# Patient Record
Sex: Female | Born: 1960 | Race: Black or African American | Hispanic: No | Marital: Married | Smoking: Never smoker
Health system: Southern US, Community
[De-identification: ages and names within clinical notes are randomized; demographics above are authoritative.]

## PROBLEM LIST (undated history)

## (undated) DIAGNOSIS — G4730 Sleep apnea, unspecified: Secondary | ICD-10-CM

## (undated) DIAGNOSIS — F329 Major depressive disorder, single episode, unspecified: Secondary | ICD-10-CM

## (undated) DIAGNOSIS — R011 Cardiac murmur, unspecified: Secondary | ICD-10-CM

## (undated) DIAGNOSIS — F32A Depression, unspecified: Secondary | ICD-10-CM

## (undated) DIAGNOSIS — F419 Anxiety disorder, unspecified: Secondary | ICD-10-CM

## (undated) DIAGNOSIS — R7303 Prediabetes: Secondary | ICD-10-CM

## (undated) DIAGNOSIS — K219 Gastro-esophageal reflux disease without esophagitis: Secondary | ICD-10-CM

## (undated) DIAGNOSIS — A4902 Methicillin resistant Staphylococcus aureus infection, unspecified site: Secondary | ICD-10-CM

## (undated) DIAGNOSIS — I10 Essential (primary) hypertension: Secondary | ICD-10-CM

## (undated) HISTORY — DX: Essential (primary) hypertension: I10

---

## 2000-08-10 HISTORY — PX: ABDOMINAL HYSTERECTOMY: SHX81

## 2005-07-10 DIAGNOSIS — I10 Essential (primary) hypertension: Secondary | ICD-10-CM | POA: Insufficient documentation

## 2005-07-31 ENCOUNTER — Ambulatory Visit: Payer: Self-pay

## 2005-08-10 DIAGNOSIS — A4902 Methicillin resistant Staphylococcus aureus infection, unspecified site: Secondary | ICD-10-CM

## 2005-08-10 HISTORY — DX: Methicillin resistant Staphylococcus aureus infection, unspecified site: A49.02

## 2006-07-13 DIAGNOSIS — Z1611 Resistance to penicillins: Secondary | ICD-10-CM

## 2006-07-13 HISTORY — DX: Resistance to penicillins: Z16.11

## 2006-09-29 DIAGNOSIS — E7800 Pure hypercholesterolemia, unspecified: Secondary | ICD-10-CM | POA: Insufficient documentation

## 2006-10-05 DIAGNOSIS — R6889 Other general symptoms and signs: Secondary | ICD-10-CM | POA: Insufficient documentation

## 2006-10-06 LAB — HM PAP SMEAR

## 2007-12-27 ENCOUNTER — Ambulatory Visit: Payer: Self-pay | Admitting: Family Medicine

## 2008-01-11 ENCOUNTER — Ambulatory Visit: Payer: Self-pay | Admitting: Family Medicine

## 2008-04-27 DIAGNOSIS — K2100 Gastro-esophageal reflux disease with esophagitis, without bleeding: Secondary | ICD-10-CM | POA: Insufficient documentation

## 2008-08-30 ENCOUNTER — Ambulatory Visit: Payer: Self-pay | Admitting: Internal Medicine

## 2009-08-23 ENCOUNTER — Ambulatory Visit: Payer: Self-pay | Admitting: Family Medicine

## 2009-08-30 ENCOUNTER — Ambulatory Visit: Payer: Self-pay

## 2009-09-19 ENCOUNTER — Ambulatory Visit: Payer: Self-pay | Admitting: Family Medicine

## 2009-09-19 LAB — HM MAMMOGRAPHY

## 2009-11-21 ENCOUNTER — Ambulatory Visit: Payer: Self-pay | Admitting: Unknown Physician Specialty

## 2009-11-21 LAB — HM COLONOSCOPY

## 2010-08-10 HISTORY — PX: KNEE SURGERY: SHX244

## 2010-08-10 HISTORY — PX: LAPAROSCOPIC OOPHORECTOMY: SUR783

## 2010-08-21 ENCOUNTER — Other Ambulatory Visit: Payer: Self-pay | Admitting: Family Medicine

## 2010-11-03 ENCOUNTER — Ambulatory Visit: Payer: Self-pay | Admitting: Specialist

## 2010-11-10 ENCOUNTER — Ambulatory Visit: Payer: Self-pay | Admitting: Specialist

## 2011-01-14 ENCOUNTER — Ambulatory Visit: Payer: Self-pay | Admitting: Family Medicine

## 2011-03-23 ENCOUNTER — Ambulatory Visit: Payer: Self-pay | Admitting: Family Medicine

## 2011-03-27 ENCOUNTER — Ambulatory Visit: Payer: Self-pay | Admitting: Family Medicine

## 2011-04-01 ENCOUNTER — Ambulatory Visit: Payer: Self-pay | Admitting: Obstetrics & Gynecology

## 2011-04-07 ENCOUNTER — Ambulatory Visit: Payer: Self-pay | Admitting: Obstetrics & Gynecology

## 2011-04-10 LAB — PATHOLOGY REPORT

## 2011-08-11 HISTORY — PX: APPENDECTOMY: SHX54

## 2012-01-20 ENCOUNTER — Other Ambulatory Visit: Payer: Self-pay | Admitting: Family Medicine

## 2012-01-20 LAB — COMPREHENSIVE METABOLIC PANEL
Alkaline Phosphatase: 112 U/L (ref 50–136)
Anion Gap: 9 (ref 7–16)
Bilirubin,Total: 0.3 mg/dL (ref 0.2–1.0)
Co2: 29 mmol/L (ref 21–32)
Creatinine: 1.21 mg/dL (ref 0.60–1.30)
EGFR (African American): 60 — ABNORMAL LOW
EGFR (Non-African Amer.): 52 — ABNORMAL LOW
Glucose: 101 mg/dL — ABNORMAL HIGH (ref 65–99)
Osmolality: 288 (ref 275–301)
SGPT (ALT): 39 U/L
Sodium: 143 mmol/L (ref 136–145)

## 2012-01-20 LAB — CBC WITH DIFFERENTIAL/PLATELET
Eosinophil #: 0.2 10*3/uL (ref 0.0–0.7)
Lymphocyte #: 3.3 10*3/uL (ref 1.0–3.6)
Lymphocyte %: 47 %
MCV: 80 fL (ref 80–100)
Monocyte #: 0.6 x10 3/mm (ref 0.2–0.9)
Monocyte %: 8.8 %
Neutrophil #: 2.8 10*3/uL (ref 1.4–6.5)
Platelet: 206 10*3/uL (ref 150–440)

## 2012-01-20 LAB — LIPID PANEL
Cholesterol: 177 mg/dL (ref 0–200)
Ldl Cholesterol, Calc: 115 mg/dL — ABNORMAL HIGH (ref 0–100)

## 2012-01-20 LAB — HEMOGLOBIN A1C: Hemoglobin A1C: 6.7 % — ABNORMAL HIGH (ref 4.2–6.3)

## 2012-02-05 ENCOUNTER — Ambulatory Visit: Payer: Self-pay | Admitting: General Surgery

## 2012-02-09 ENCOUNTER — Ambulatory Visit: Payer: Self-pay | Admitting: Anesthesiology

## 2012-02-10 ENCOUNTER — Ambulatory Visit: Payer: Self-pay | Admitting: General Surgery

## 2012-02-15 LAB — PATHOLOGY REPORT

## 2012-03-09 ENCOUNTER — Ambulatory Visit: Payer: Self-pay | Admitting: Family Medicine

## 2012-03-10 ENCOUNTER — Ambulatory Visit: Payer: Self-pay | Admitting: Family Medicine

## 2012-03-24 ENCOUNTER — Ambulatory Visit: Payer: Self-pay | Admitting: Family Medicine

## 2012-04-18 ENCOUNTER — Ambulatory Visit: Payer: Self-pay | Admitting: Family Medicine

## 2012-05-10 ENCOUNTER — Ambulatory Visit: Payer: Self-pay | Admitting: Family Medicine

## 2012-06-10 ENCOUNTER — Ambulatory Visit: Payer: Self-pay | Admitting: Family Medicine

## 2012-10-07 ENCOUNTER — Emergency Department: Payer: Self-pay | Admitting: Emergency Medicine

## 2012-10-09 ENCOUNTER — Emergency Department: Payer: Self-pay | Admitting: Unknown Physician Specialty

## 2012-10-20 ENCOUNTER — Encounter: Payer: Self-pay | Admitting: General Practice

## 2012-11-08 ENCOUNTER — Encounter: Payer: Self-pay | Admitting: General Practice

## 2012-11-30 ENCOUNTER — Ambulatory Visit: Payer: Self-pay | Admitting: Family Medicine

## 2012-12-08 ENCOUNTER — Encounter: Payer: Self-pay | Admitting: General Practice

## 2013-05-23 ENCOUNTER — Ambulatory Visit: Payer: Self-pay | Admitting: Family Medicine

## 2013-07-18 IMAGING — CT CT ABD-PELV W/ CM
1 of 2 series · 16 of 32 positions shown, 20 images · IV contrast (isovue)
Comparison: none

REASON FOR EXAM: abdominal pain
COMMENTS:

PROCEDURE:     CT  - CT ABDOMEN / PELVIS  W  - February 05, 2012  [DATE]
RESULT:     Comparison:  03/23/2011
TECHNIQUE: Multiple axial images of the abdomen and pelvis were performed
from the lung bases to the pubic symphysis, with p.o. contrast and with 100
mL of Isovue 300 intravenous contrast.

[Series 2: 3mm soft tissue · axial · 0.64mm/px · z∈[-521,-89]mm · 16 of 158 slices shown, 20 images]
[im 7/158  soft-tissue]
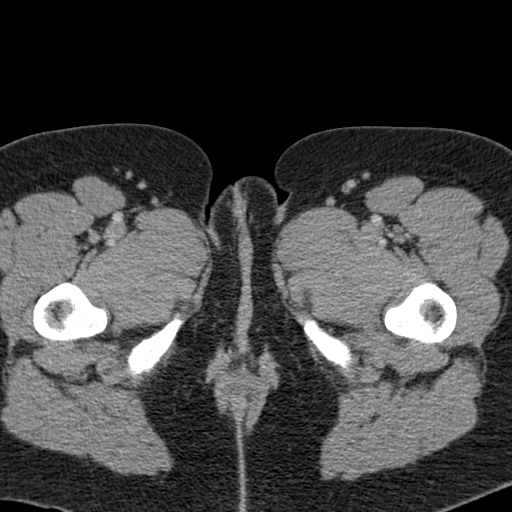
[im 7/158  bone]
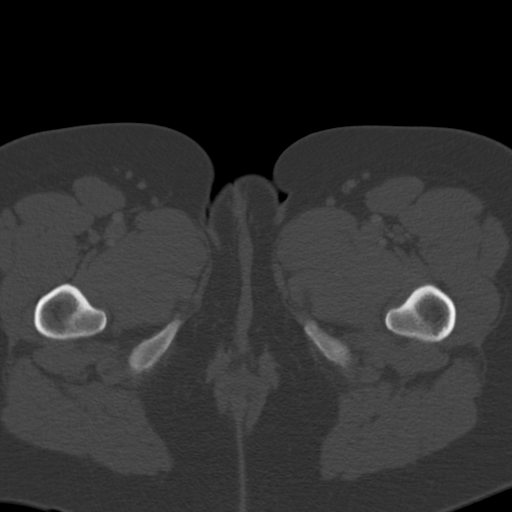
[im 19/158  soft-tissue]
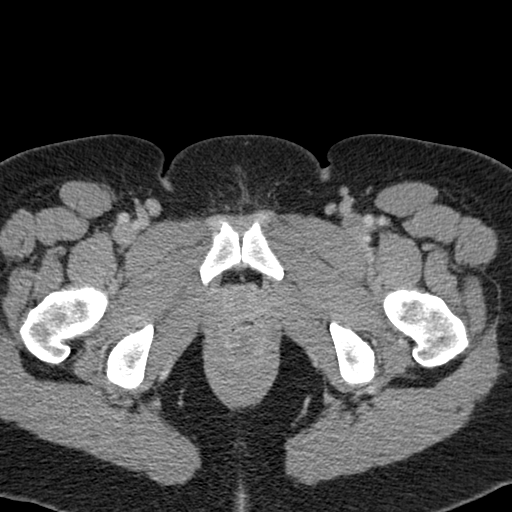
[im 32/158  soft-tissue]
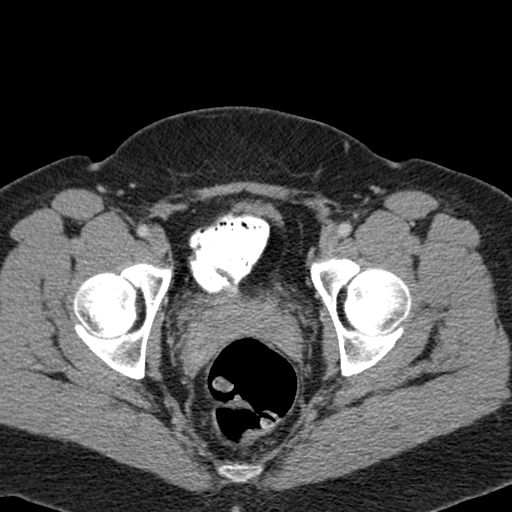
[im 44/158  soft-tissue]
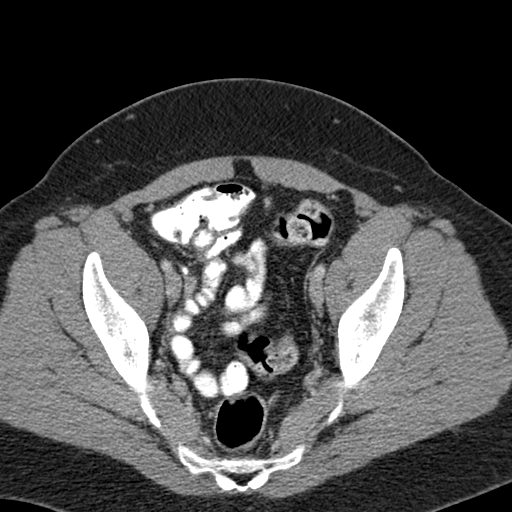
[im 51/158  soft-tissue]
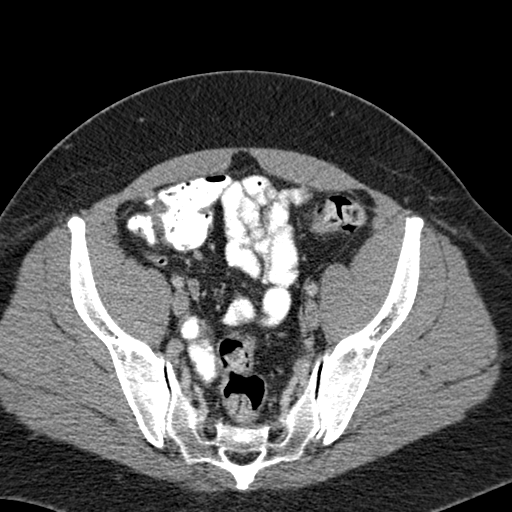
[im 63/158  soft-tissue]
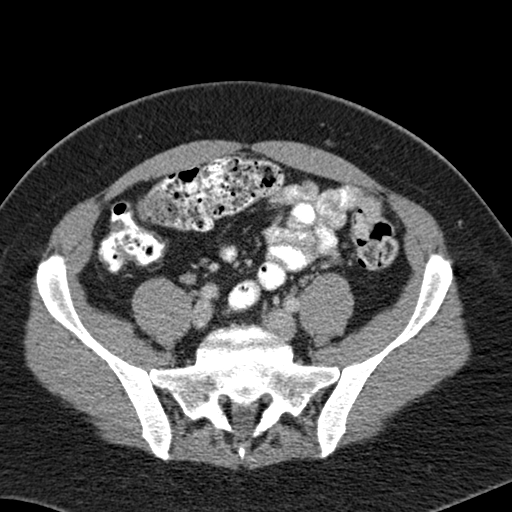
[im 76/158  soft-tissue]
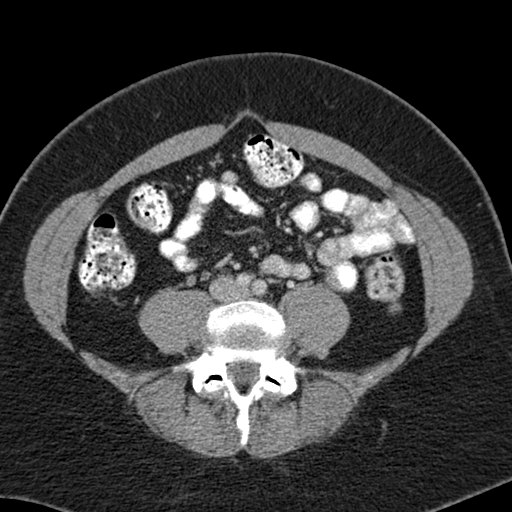
[im 82/158  soft-tissue]
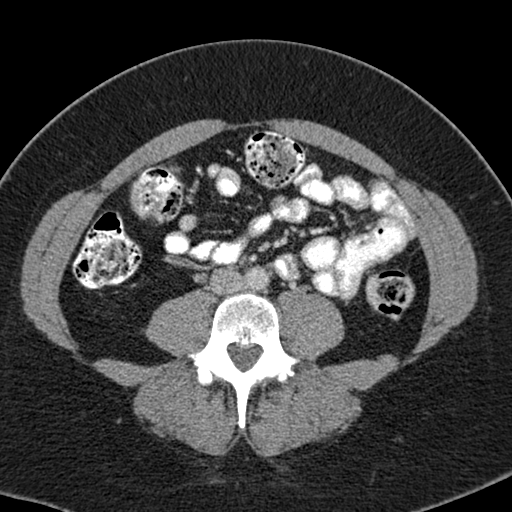
[im 95/158  soft-tissue]
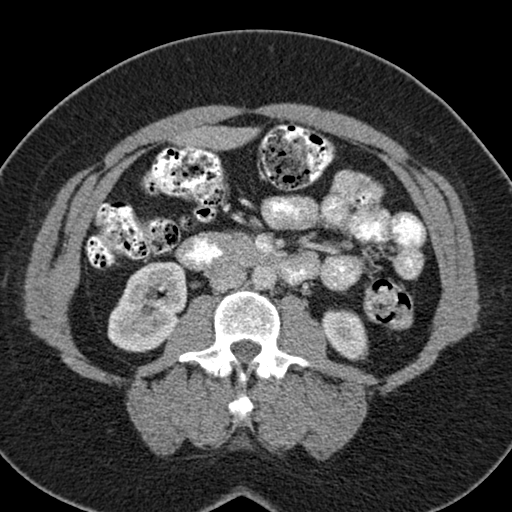
[im 95/158  bone]
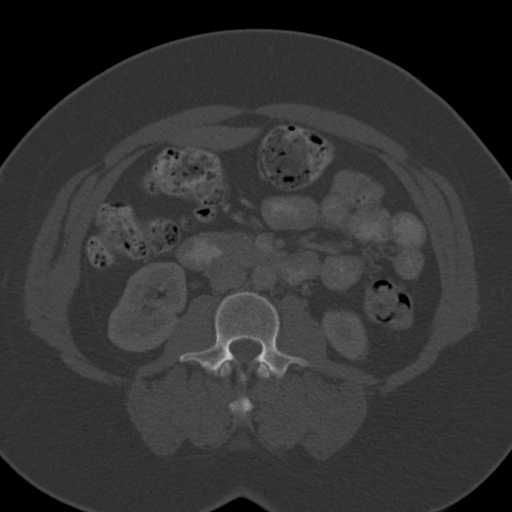
[im 107/158  soft-tissue]
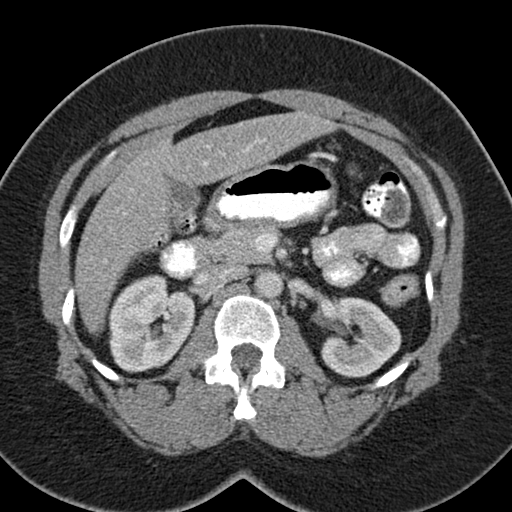
[im 120/158  soft-tissue]
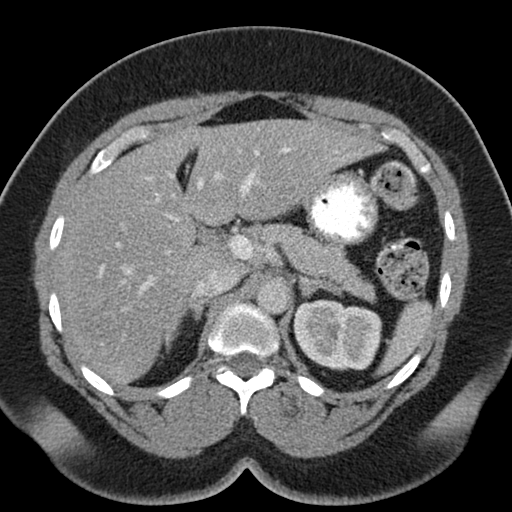
[im 126/158  soft-tissue]
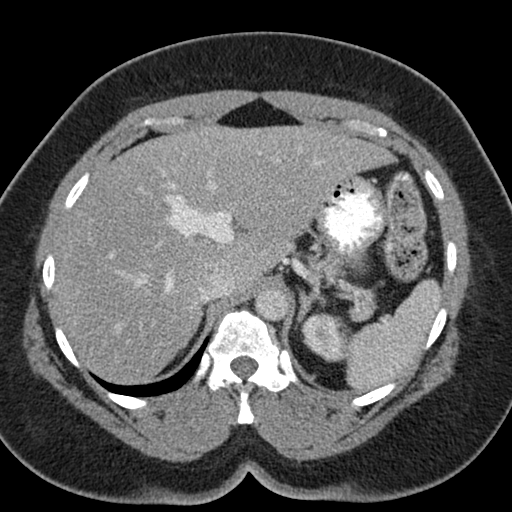
[im 132/158  lung]
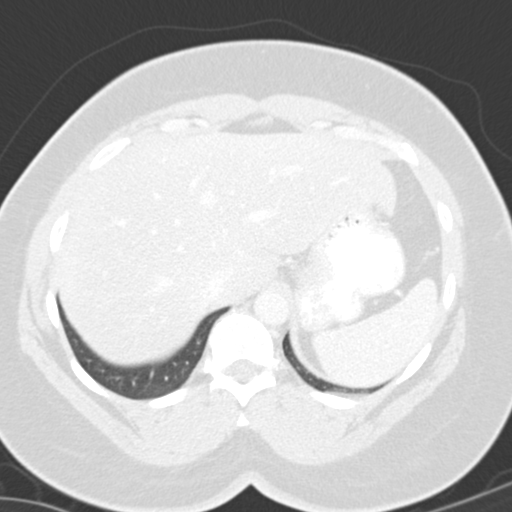
[im 139/158  soft-tissue]
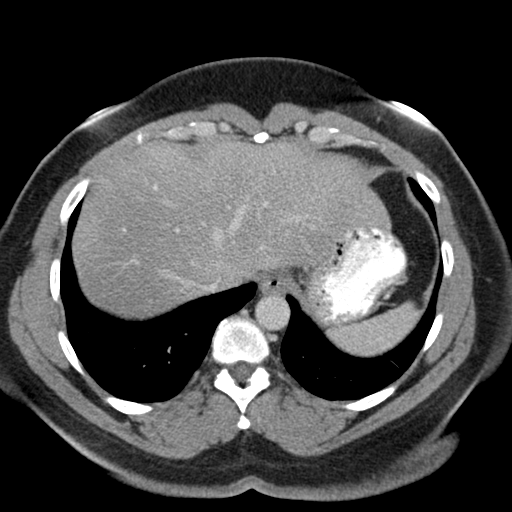
[im 139/158  lung]
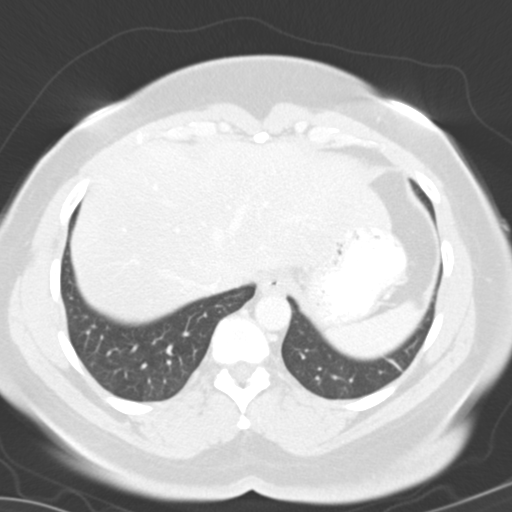
[im 145/158  lung]
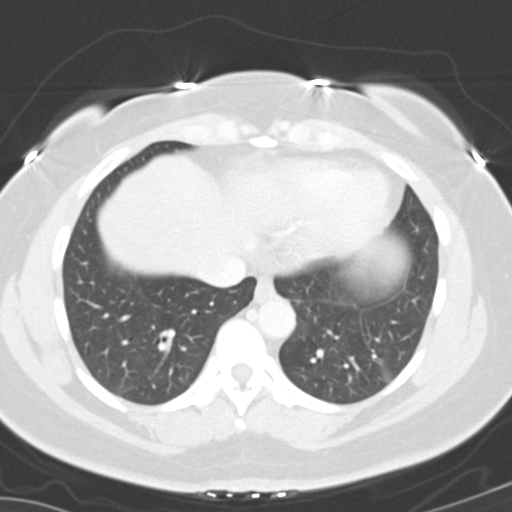
[im 151/158  soft-tissue]
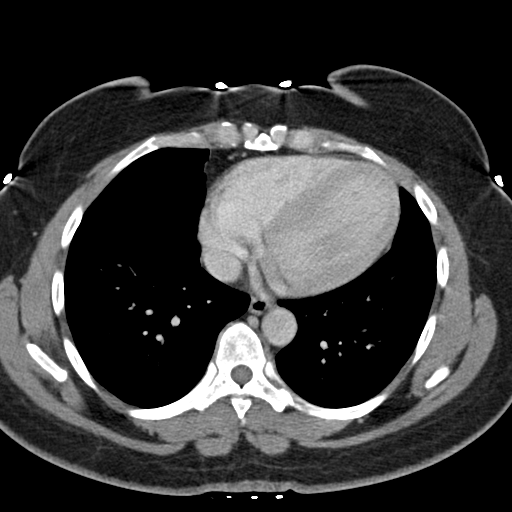
[im 151/158  lung]
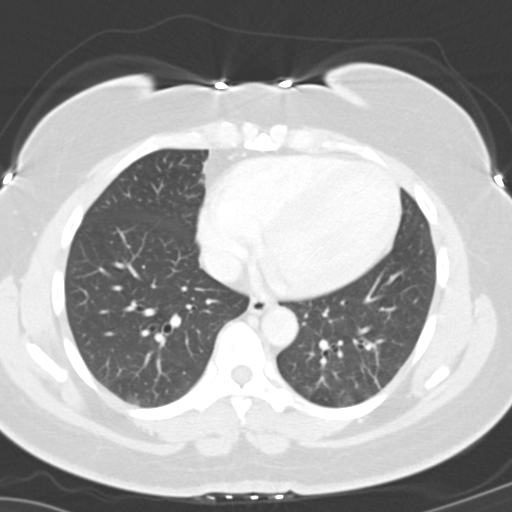

[16 of 32 positions shown; findings below may reference images not displayed]

FINDINGS: The liver is slightly low in attenuation, raising the possibility of hepatic
steatosis. The gallbladder, spleen, adrenals, and pancreas are unremarkable.
The kidneys enhance normally.

The patient is status post hysterectomy. The small and large bowel are
normal in caliber. The appendix is normal.

No aggressive lytic or sclerotic osseous lesions are identified.
IMPRESSION: No acute findings in the abdomen or pelvis.

## 2013-08-10 HISTORY — PX: COLONOSCOPY: SHX174

## 2014-06-28 ENCOUNTER — Ambulatory Visit: Payer: Self-pay | Admitting: Family Medicine

## 2014-06-28 LAB — COMPREHENSIVE METABOLIC PANEL
ANION GAP: 8 (ref 7–16)
Albumin: 3.8 g/dL (ref 3.4–5.0)
Alkaline Phosphatase: 89 U/L
BILIRUBIN TOTAL: 0.4 mg/dL (ref 0.2–1.0)
BUN: 13 mg/dL (ref 7–18)
CALCIUM: 9.2 mg/dL (ref 8.5–10.1)
CO2: 27 mmol/L (ref 21–32)
Chloride: 105 mmol/L (ref 98–107)
Creatinine: 1.22 mg/dL (ref 0.60–1.30)
EGFR (African American): 59 — ABNORMAL LOW
EGFR (Non-African Amer.): 49 — ABNORMAL LOW
GLUCOSE: 124 mg/dL — AB (ref 65–99)
Osmolality: 281 (ref 275–301)
Potassium: 3.4 mmol/L — ABNORMAL LOW (ref 3.5–5.1)
SGOT(AST): 26 U/L (ref 15–37)
SGPT (ALT): 40 U/L
SODIUM: 140 mmol/L (ref 136–145)
Total Protein: 7.8 g/dL (ref 6.4–8.2)

## 2014-06-28 LAB — LIPID PANEL
Cholesterol: 173 mg/dL (ref 0–200)
HDL Cholesterol: 25 mg/dL — ABNORMAL LOW (ref 40–60)
Ldl Cholesterol, Calc: 126 mg/dL — ABNORMAL HIGH (ref 0–100)
Triglycerides: 111 mg/dL (ref 0–200)
VLDL Cholesterol, Calc: 22 mg/dL (ref 5–40)

## 2014-06-28 LAB — HEMOGLOBIN A1C: Hemoglobin A1C: 6.8 % — ABNORMAL HIGH (ref 4.2–6.3)

## 2014-07-18 ENCOUNTER — Ambulatory Visit: Payer: Self-pay | Admitting: Family Medicine

## 2014-10-29 LAB — CBC AND DIFFERENTIAL
HEMATOCRIT: 42 % (ref 36–46)
HEMOGLOBIN: 13.2 g/dL (ref 12.0–16.0)
Neutrophils Absolute: 32 /uL
PLATELETS: 255 10*3/uL (ref 150–399)
WBC: 6.5 10*3/mL

## 2014-11-16 ENCOUNTER — Encounter: Payer: Self-pay | Admitting: General Surgery

## 2014-11-28 ENCOUNTER — Encounter: Payer: Self-pay | Admitting: General Surgery

## 2014-11-29 ENCOUNTER — Ambulatory Visit (INDEPENDENT_AMBULATORY_CARE_PROVIDER_SITE_OTHER): Payer: BC Managed Care – PPO | Admitting: General Surgery

## 2014-11-29 ENCOUNTER — Encounter: Payer: Self-pay | Admitting: General Surgery

## 2014-11-29 VITALS — BP 160/82 | HR 88 | Resp 16 | Ht 69.0 in | Wt 260.0 lb

## 2014-11-29 DIAGNOSIS — Q828 Other specified congenital malformations of skin: Secondary | ICD-10-CM | POA: Diagnosis not present

## 2014-11-29 NOTE — Progress Notes (Signed)
Patient ID: Miranda Moore, female   DOB: 23-Dec-1960, 54 y.o.   MRN: [id number]  Chief Complaint  Patient presents with  . Procedure    mole removal    HPI Miranda Moore is a 54 y.o. female.  Here today for given tag excision. She has noticed them readily developing over several years. They've become more symptomatic as they catch her jewelry and have been gradually enlarging.   HPI  Past Medical History  Diagnosis Date  . Hypertension     Past Surgical History  Procedure Laterality Date  . Abdominal hysterectomy  2002  . Laparoscopic oophorectomy  2012  . Knee surgery  2012  . Colonoscopy  2015    Family History  Problem Relation Age of Onset  . Cancer Mother     colon    Social History History  Substance Use Topics  . Smoking status: Never Smoker   . Smokeless tobacco: Never Used  . Alcohol Use: 0.0 oz/week    0 Standard drinks or equivalent per week     Comment: 2-4/week    Allergies  Allergen Reactions  . Other Hives    actifed  . Sulfur Other (See Comments)    Low b/p    Current Outpatient Prescriptions  Medication Sig Dispense Refill  . ALPRAZolam (XANAX) 0.5 MG tablet Take 0.5 mg by mouth 3 (three) times daily as needed.   0  . Amlodipine-Valsartan-HCTZ 10-160-12.5 MG TABS daily.     Marland Kitchen aspirin 81 MG tablet Take 81 mg by mouth daily.    . Cholecalciferol (VITAMIN D-3) 1000 UNITS CAPS Take by mouth daily.    . furosemide (LASIX) 20 MG tablet Take 20 mg by mouth daily.     Marland Kitchen omeprazole (PRILOSEC) 40 MG capsule Take 40 mg by mouth daily.     Marland Kitchen POTASSIUM CITRATE PO Take by mouth daily.     No current facility-administered medications for this visit.    Review of Systems Review of Systems  Constitutional: Negative.   Respiratory: Negative.   Cardiovascular: Negative.     Blood pressure 160/82, pulse 88, resp. rate 16, height 5\' 9"  (1.753 m), weight 260 lb (117.935 kg).  Physical Exam Physical Exam  Constitutional: She is oriented to person,  place, and time. She appears well-developed and well-nourished.  HENT:  Head:    Neck: Neck supple.  Lymphadenopathy:    She has no cervical adenopathy.  Neurological: She is alert and oriented to person, place, and time.  Skin: Skin is warm and dry.      Assessment    Interim dramatic skin tags on the lower neck.    Plan    Thermal excision was reviewed. The area was prepped with alcohol. A total of 3 mL of 0.5% Xylocaine with 0.25% Marcaine with 1-200,000 epinephrine was utilized well tolerated. Thermal cautery was used to remove the lesions. Each site was treated with bacitracin and a Band-Aid.  The patient will apply daily bacitracin until complete healing is noted. No samples to pathology.     Follow up as needed.  PCP:  Etheleen Mayhew 11/29/2014, 7:31 PM

## 2014-11-29 NOTE — Patient Instructions (Signed)
Keep area clean 

## 2015-02-15 ENCOUNTER — Other Ambulatory Visit: Payer: Self-pay | Admitting: Family Medicine

## 2015-02-15 DIAGNOSIS — F419 Anxiety disorder, unspecified: Secondary | ICD-10-CM

## 2015-02-15 NOTE — Telephone Encounter (Signed)
Please phone in rx. Thanks!

## 2015-04-04 ENCOUNTER — Other Ambulatory Visit: Payer: Self-pay | Admitting: Family Medicine

## 2015-04-04 DIAGNOSIS — F419 Anxiety disorder, unspecified: Secondary | ICD-10-CM

## 2015-04-04 NOTE — Telephone Encounter (Signed)
Last OV 10/2014 (Saw Simona Huh)  Thanks,   -Mickel Baas

## 2015-04-04 NOTE — Telephone Encounter (Signed)
Please call in rx.  Thanks.  

## 2015-05-20 ENCOUNTER — Other Ambulatory Visit: Payer: Self-pay | Admitting: Family Medicine

## 2015-05-20 DIAGNOSIS — F419 Anxiety disorder, unspecified: Secondary | ICD-10-CM

## 2015-05-20 NOTE — Telephone Encounter (Signed)
Last OV 10/2014  Thanks,   -Aerin Delany  

## 2015-05-31 ENCOUNTER — Ambulatory Visit (INDEPENDENT_AMBULATORY_CARE_PROVIDER_SITE_OTHER): Payer: BC Managed Care – PPO | Admitting: Family Medicine

## 2015-05-31 ENCOUNTER — Encounter: Payer: Self-pay | Admitting: Family Medicine

## 2015-05-31 VITALS — BP 140/80 | HR 78 | Temp 97.9°F | Resp 16 | Wt 265.6 lb

## 2015-05-31 DIAGNOSIS — I10 Essential (primary) hypertension: Secondary | ICD-10-CM

## 2015-05-31 DIAGNOSIS — E669 Obesity, unspecified: Secondary | ICD-10-CM | POA: Insufficient documentation

## 2015-05-31 DIAGNOSIS — K090 Developmental odontogenic cysts: Secondary | ICD-10-CM | POA: Insufficient documentation

## 2015-05-31 DIAGNOSIS — F419 Anxiety disorder, unspecified: Secondary | ICD-10-CM | POA: Diagnosis not present

## 2015-05-31 DIAGNOSIS — R05 Cough: Secondary | ICD-10-CM | POA: Insufficient documentation

## 2015-05-31 DIAGNOSIS — R0789 Other chest pain: Secondary | ICD-10-CM | POA: Insufficient documentation

## 2015-05-31 DIAGNOSIS — K2100 Gastro-esophageal reflux disease with esophagitis, without bleeding: Secondary | ICD-10-CM

## 2015-05-31 DIAGNOSIS — M546 Pain in thoracic spine: Secondary | ICD-10-CM | POA: Insufficient documentation

## 2015-05-31 DIAGNOSIS — E559 Vitamin D deficiency, unspecified: Secondary | ICD-10-CM | POA: Insufficient documentation

## 2015-05-31 DIAGNOSIS — R011 Cardiac murmur, unspecified: Secondary | ICD-10-CM | POA: Insufficient documentation

## 2015-05-31 DIAGNOSIS — R059 Cough, unspecified: Secondary | ICD-10-CM | POA: Insufficient documentation

## 2015-05-31 DIAGNOSIS — M545 Low back pain, unspecified: Secondary | ICD-10-CM | POA: Insufficient documentation

## 2015-05-31 DIAGNOSIS — R21 Rash and other nonspecific skin eruption: Secondary | ICD-10-CM | POA: Insufficient documentation

## 2015-05-31 DIAGNOSIS — E042 Nontoxic multinodular goiter: Secondary | ICD-10-CM | POA: Insufficient documentation

## 2015-05-31 DIAGNOSIS — N951 Menopausal and female climacteric states: Secondary | ICD-10-CM | POA: Insufficient documentation

## 2015-05-31 DIAGNOSIS — R7309 Other abnormal glucose: Secondary | ICD-10-CM | POA: Insufficient documentation

## 2015-05-31 DIAGNOSIS — O039 Complete or unspecified spontaneous abortion without complication: Secondary | ICD-10-CM | POA: Insufficient documentation

## 2015-05-31 DIAGNOSIS — R609 Edema, unspecified: Secondary | ICD-10-CM | POA: Insufficient documentation

## 2015-05-31 DIAGNOSIS — E010 Iodine-deficiency related diffuse (endemic) goiter: Secondary | ICD-10-CM | POA: Insufficient documentation

## 2015-05-31 DIAGNOSIS — E119 Type 2 diabetes mellitus without complications: Secondary | ICD-10-CM | POA: Insufficient documentation

## 2015-05-31 DIAGNOSIS — R600 Localized edema: Secondary | ICD-10-CM | POA: Insufficient documentation

## 2015-05-31 HISTORY — DX: Complete or unspecified spontaneous abortion without complication: O03.9

## 2015-05-31 MED ORDER — ALPRAZOLAM 0.5 MG PO TABS: 0.5000 mg | ORAL_TABLET | Freq: Every evening | ORAL | Status: DC | PRN

## 2015-05-31 NOTE — Progress Notes (Signed)
Patient ID: Miranda Moore, female   DOB: [birth year], 54 y.o.   MRN: [id number]       Patient: Miranda Moore Female    DOB: 25-May-1961   54 y.o.   MRN: [id number] Visit Date: 05/31/2015  Today's Provider: Vernie Murders, PA   Chief Complaint  Patient presents with  . Anxiety    follow-up, pt takes alprazolam 0.5 mg prn   Subjective:    Anxiety Presents for follow-up visit. Onset was 1 to 5 years ago. The problem has been gradually worsening. Symptoms include irritability and muscle tension. Patient reports no chest pain, decreased concentration, dizziness, dry mouth, excessive worry, hyperventilation, insomnia, nervous/anxious behavior, panic, restlessness, shortness of breath or suicidal ideas. Symptoms occur occasionally. The severity of symptoms is moderate. The symptoms are aggravated by work stress. The patient sleeps 6 hours per night. The quality of sleep is fair. Nighttime awakenings: one to two.   Treatments tried: xanax 0.5 mg prn. The treatment provided mild relief. Compliance with prior treatments has been good. Compliance with medications is 76-100%.   Patient Active Problem List   Diagnosis Date Noted  . Abortion, spontaneous 05/31/2015  . Edema leg 05/31/2015  . Chest discomfort 05/31/2015  . Cough 05/31/2015  . Diabetes mellitus ([place]) 05/31/2015  . Accumulation of fluid in tissues 05/31/2015  . Big thyroid 05/31/2015  . Cardiac murmur 05/31/2015  . Hot flash, menopausal 05/31/2015  . LBP (low back pain) 05/31/2015  . Acute thoracic back pain 05/31/2015  . Multinodular goiter 05/31/2015  . Adiposity 05/31/2015  . Abnormal blood sugar 05/31/2015  . Cutaneous eruption 05/31/2015  . Avitaminosis D 05/31/2015  . Anxiety 02/15/2015  . Accessory skin tags 11/29/2014  . Esophagitis, reflux 04/27/2008  . Other general symptoms and signs 10/05/2006  . Pure hypercholesterolemia 09/29/2006  . 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 Acute  stress disorder 02/18/2006  . 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 Bone/cartilage disorder 07/29/2005  . Benign essential HTN 07/10/2005   Past Surgical History  Procedure Laterality Date  . Abdominal hysterectomy  2002  . Laparoscopic oophorectomy  2012  . Knee surgery  2012  . Colonoscopy  2015   Family History  Problem Relation Age of Onset  . Cancer Mother     colon    Allergies  Allergen Reactions  . Chlorpheniramine-Phenylephrine Hives  . Other Hives    actifed  . Sulfa Antibiotics   . Sulfur Other (See Comments)    Low b/p   Previous Medications   ALPRAZOLAM (XANAX) 0.5 MG TABLET    TAKE 1/2 TO 1 TABLET BY MOUTH 3 TIMES A DAY AS NEEDED   AMLODIPINE-VALSARTAN-HCTZ 10-160-12.5 MG TABS    daily.    ASPIRIN 81 MG TABLET    Take 81 mg by mouth daily.   CHOLECALCIFEROL (VITAMIN D-3) 1000 UNITS CAPS    Take by mouth daily.   FUROSEMIDE (LASIX) 20 MG TABLET    Take 20 mg by mouth daily.    OMEPRAZOLE (PRILOSEC) 40 MG CAPSULE    Take 40 mg by mouth daily.    POTASSIUM CITRATE PO    Take by mouth daily.    Review of Systems  Constitutional: Positive for irritability.  Respiratory: Negative for shortness of breath.   Cardiovascular: Negative for chest pain.  Neurological: Negative for dizziness.  Psychiatric/Behavioral: Negative for suicidal ideas and decreased concentration. The patient is not nervous/anxious and does not have insomnia.     Social History  Substance  Use Topics  . Smoking status: Never Smoker   . Smokeless tobacco: Never Used  . Alcohol Use: 0.0 oz/week    0 Standard drinks or equivalent per week     Comment: 2-4/week   Objective:   BP 140/80 mmHg  Pulse 78  Temp(Src) 97.9 F (36.6 C) (Oral)  Resp 16  Wt 265 lb 9.6 oz (120.475 kg)  SpO2 97%  Physical Exam  Constitutional: She is oriented to person, place, and time. She appears well-developed and well-nourished.  HENT:  Head: Normocephalic and atraumatic.  Right Ear: External ear  normal.  Left Ear: External ear normal.  Nose: Nose normal.  Mouth/Throat: Oropharynx is clear and moist.  Eyes: Conjunctivae and EOM are normal.  Neck: Normal range of motion. Neck supple. No thyromegaly present.  Cardiovascular: Normal rate, regular rhythm and normal heart sounds.   Pulmonary/Chest: Effort normal and breath sounds normal.  Abdominal: Soft. Bowel sounds are normal.  Musculoskeletal: Normal range of motion.  Lymphadenopathy:    She has no cervical adenopathy.  Neurological: She is alert and oriented to person, place, and time.  Skin: No rash noted.  Psychiatric: Her speech is normal. Thought content normal. Her mood appears anxious. She is agitated. Cognition and memory are normal.  Intermittent anxiousness and irritability - especially at work.       Assessment & Plan:     1. Anxiety Stable and well controlled with good sleep pattern on the Xanax one at bedtime. No panic attacks. Stress at work seems to be the most anxiety provoking activities. Will refill medication. May need counseling if issues persist. Recheck in 2-3 months. - ALPRAZolam (XANAX) 0.5 MG tablet; Take 1 tablet (0.5 mg total) by mouth at bedtime as needed for anxiety.  Dispense: 30 tablet; Refill: 1  2. Benign essential HTN Stable and tolerating Exforge-HCT without side effects. Will continue present dosage and recheck labs. Follow up pending reports. (Has a history pre-diabetic Hgb A1C). - CBC with Differential/Platelet - COMPLETE METABOLIC PANEL WITH GFR - TSH - Lipid panel - Hemoglobin A1c  3. Esophagitis, reflux Well controlled with use of Omeprazole daily. Diagnosed with LA Grade B reflux esophagitis on upper endoscopy by Dr. Vira Agar on 11-21-09. Will continue present dosage and follow up with Dr. Vira Agar as planned.       Vernie Murders, PA  [facility]

## 2015-06-26 ENCOUNTER — Other Ambulatory Visit: Payer: Self-pay | Admitting: Family Medicine

## 2015-06-26 DIAGNOSIS — R600 Localized edema: Secondary | ICD-10-CM

## 2015-08-28 ENCOUNTER — Other Ambulatory Visit: Payer: Self-pay | Admitting: Family Medicine

## 2015-08-28 DIAGNOSIS — F419 Anxiety disorder, unspecified: Secondary | ICD-10-CM

## 2015-08-28 NOTE — Telephone Encounter (Signed)
Dennis patient  Last refill: 05/31/2015 with 1 refill  Last OV: 05/31/2015

## 2015-09-04 ENCOUNTER — Other Ambulatory Visit
Admission: RE | Admit: 2015-09-04 | Discharge: 2015-09-04 | Disposition: A | Discharge status: Home / Self Care | Payer: BC Managed Care – PPO | Source: Ambulatory Visit | Attending: Family Medicine | Admitting: Family Medicine

## 2015-09-04 DIAGNOSIS — I10 Essential (primary) hypertension: Secondary | ICD-10-CM | POA: Insufficient documentation

## 2015-09-04 LAB — LIPID PANEL
CHOL/HDL RATIO: 5.5 ratio
Cholesterol: 166 mg/dL (ref 0–200)
HDL: 30 mg/dL — AB (ref >40)
LDL Cholesterol: 109 mg/dL — ABNORMAL HIGH (ref 0–99)
Triglycerides: 137 mg/dL (ref <150)
VLDL: 27 mg/dL (ref 0–40)

## 2015-09-04 LAB — CBC WITH DIFFERENTIAL/PLATELET
Basophils Absolute: 0.1 10*3/uL (ref 0–0.1)
Basophils Relative: 1 %
EOS ABS: 0.4 10*3/uL (ref 0–0.7)
Eosinophils Relative: 4 %
HEMATOCRIT: 38.2 % (ref 35.0–47.0)
HEMOGLOBIN: 12.3 g/dL (ref 12.0–16.0)
LYMPHS ABS: 3.9 10*3/uL — AB (ref 1.0–3.6)
LYMPHS PCT: 48 %
MCH: 24.5 pg — AB (ref 26.0–34.0)
MCHC: 32.2 g/dL (ref 32.0–36.0)
MCV: 76 fL — ABNORMAL LOW (ref 80.0–100.0)
Monocytes Absolute: 0.4 10*3/uL (ref 0.2–0.9)
Monocytes Relative: 6 %
NEUTROS ABS: 3.2 10*3/uL (ref 1.4–6.5)
NEUTROS PCT: 41 %
Platelets: 227 10*3/uL (ref 150–440)
RBC: 5.03 MIL/uL (ref 3.80–5.20)
RDW: 15 % — ABNORMAL HIGH (ref 11.5–14.5)
WBC: 8 10*3/uL (ref 3.6–11.0)

## 2015-09-04 LAB — TSH: TSH: 1.692 u[IU]/mL (ref 0.350–4.500)

## 2015-09-04 LAB — HEMOGLOBIN A1C: HEMOGLOBIN A1C: 6.8 % — AB (ref 4.0–6.0)

## 2015-09-06 ENCOUNTER — Ambulatory Visit (INDEPENDENT_AMBULATORY_CARE_PROVIDER_SITE_OTHER): Payer: BC Managed Care – PPO | Admitting: Family Medicine

## 2015-09-06 ENCOUNTER — Encounter: Payer: Self-pay | Admitting: Family Medicine

## 2015-09-06 VITALS — BP 124/68 | HR 84 | Temp 98.2°F | Resp 16 | Ht 69.5 in | Wt 263.0 lb

## 2015-09-06 DIAGNOSIS — Z021 Encounter for pre-employment examination: Secondary | ICD-10-CM | POA: Diagnosis not present

## 2015-09-06 DIAGNOSIS — E7800 Pure hypercholesterolemia, unspecified: Secondary | ICD-10-CM | POA: Diagnosis not present

## 2015-09-06 DIAGNOSIS — I10 Essential (primary) hypertension: Secondary | ICD-10-CM | POA: Diagnosis not present

## 2015-09-06 DIAGNOSIS — E119 Type 2 diabetes mellitus without complications: Secondary | ICD-10-CM | POA: Diagnosis not present

## 2015-09-06 LAB — POCT UA - MICROALBUMIN: Microalbumin Ur, POC: 50 mg/L

## 2015-09-06 NOTE — Progress Notes (Signed)
Patient ID: Miranda Moore, female   DOB: June 16, 1961, 55 y.o.   MRN: [id number]      Visit Date: 09/06/2015  Today's Provider: Vernie Murders, PA   Chief Complaint  Patient presents with  . Annual Exam   Subjective:    Annual physical exam Miranda Moore is a 55 y.o. female who presents today for health maintenance and complete physical. She feels well.  She reports she is sleeping well. Patient needs to get form filled out for a new job she is applying for.  LAST: Colonoscopy per patient 09/19/13 normal, with Dr. Vira Agar.  Tdap 05/28/2011  Flu shot 05/2015  Mammogram 06/2015 per patient  Pap smear 01/08/11 normal, had hysterectomy.  BMD 12/27/07 normal    Review of Systems  Constitutional: Negative.   Eyes: Negative.   Respiratory: Negative.   Cardiovascular: Positive for leg swelling.  Gastrointestinal: Negative.   Endocrine: Negative.   Genitourinary: Negative.   Musculoskeletal: Negative.   Skin: Negative.   Allergic/Immunologic: Negative.   Neurological: Positive for headaches.  Hematological: Negative.   Psychiatric/Behavioral: Negative.     Social History      She  reports that she has never smoked. She has never used smokeless tobacco. She reports that she drinks alcohol. She reports that she does not use illicit drugs.       Social History   Social History  . Marital Status: Married    Spouse Name: N/A  . Number of Children: N/A  . Years of Education: N/A   Social History Main Topics  . Smoking status: Never Smoker   . Smokeless tobacco: Never Used  . Alcohol Use: 0.0 oz/week    0 Standard drinks or equivalent per week     Comment: 2-4/week  . Drug Use: No  . Sexual Activity: Not Asked   Other Topics Concern  . None   Social History Narrative    Past Medical History  Diagnosis Date  . Hypertension      Patient Active Problem List   Diagnosis Date Noted  . Abortion, spontaneous 05/31/2015  . Edema leg 05/31/2015  . Chest discomfort  05/31/2015  . Cough 05/31/2015  . Diabetes mellitus ([place]) 05/31/2015  . Accumulation of fluid in tissues 05/31/2015  . Big thyroid 05/31/2015  . Cardiac murmur 05/31/2015  . Hot flash, menopausal 05/31/2015  . LBP (low back pain) 05/31/2015  . Acute thoracic back pain 05/31/2015  . Multinodular goiter 05/31/2015  . Adiposity 05/31/2015  . Abnormal blood sugar 05/31/2015  . Cutaneous eruption 05/31/2015  . Avitaminosis D 05/31/2015  . Anxiety 02/15/2015  . Accessory skin tags 11/29/2014  . Esophagitis, reflux 04/27/2008  . Other general symptoms and signs 10/05/2006  . Pure hypercholesterolemia 09/29/2006  . 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 Acute stress disorder 02/18/2006  . 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 Bone/cartilage disorder 07/29/2005  . Benign essential HTN 07/10/2005    Past Surgical History  Procedure Laterality Date  . Abdominal hysterectomy  2002  . Laparoscopic oophorectomy  2012  . Knee surgery  2012  . Colonoscopy  2015    Family History        Family Status  Relation Status Death Age  . Mother Deceased   . Brother Alive   . Maternal Grandmother Deceased   . Maternal Grandfather Deceased   . Paternal Grandmother Deceased   . Paternal Grandfather Deceased         Her family history includes Breast cancer  in her paternal aunt; Cancer in her mother; Dementia in her paternal grandfather; Heart disease in her father; Heart failure in her mother and paternal grandmother; Hypertension in her brother and father.    Allergies  Allergen Reactions  . Chlorpheniramine-Phenylephrine Hives  . Other Hives    actifed  . Sulfa Antibiotics   . Sulfur Other (See Comments)    Low b/p    Previous Medications   ALPRAZOLAM (XANAX) 0.5 MG TABLET    TAKE 1 TABLET BY MOUTH AT BEDTIME AS NEEDED FOR ANXIETY   AMLODIPINE-VALSARTAN-HCTZ 10-160-12.5 MG TABS    daily.    ASPIRIN 81 MG TABLET    Take 81 mg by mouth daily.    CALCIUM CARBONATE-VITAMIN D 600-200 MG-UNIT TABS       CHOLECALCIFEROL (VITAMIN D-3) 1000 UNITS CAPS    Take by mouth daily.   FUROSEMIDE (LASIX) 20 MG TABLET    TAKE 1 TABLET BY MOUTH EVERY DAY - PLEASE DISCONTINUE HYDROCHLOROTHIAZIDE WHILE TAKING LASIX   IBUPROFEN (ADVIL,MOTRIN) 800 MG TABLET    Take by mouth.   OMEGA-3 FATTY ACIDS (FISH OIL DOUBLE STRENGTH) 1200 MG CAPS    Take by mouth.   OMEPRAZOLE (PRILOSEC) 40 MG CAPSULE    Take 40 mg by mouth daily.    POTASSIUM CITRATE PO    Take by mouth daily.    Patient Care Team: Margo Common, PA as PCP - General (Family Medicine) Christene Lye, MD (General Surgery)     Objective:   Vitals: BP 124/68 mmHg  Pulse 84  Temp(Src) 98.2 F (36.8 C)  Resp 16  Ht 5' 9.5" (1.765 m)  Wt 263 lb (119.296 kg)  BMI 38.29 kg/m2   Physical Exam  Constitutional: She is oriented to person, place, and time. She appears well-developed and well-nourished.  HENT:  Head: Normocephalic and atraumatic.  Right Ear: External ear normal.  Left Ear: External ear normal.  Nose: Nose normal.  Mouth/Throat: Oropharynx is clear and moist.  Eyes: Conjunctivae and EOM are normal. Pupils are equal, round, and reactive to light. Right eye exhibits no discharge.  Neck: Normal range of motion. Neck supple. No tracheal deviation present. No thyromegaly present.  Cardiovascular: Normal rate, regular rhythm, normal heart sounds and intact distal pulses.   No murmur heard. Pulmonary/Chest: Effort normal and breath sounds normal. No respiratory distress. She has no wheezes. She has no rales. She exhibits no tenderness.  Abdominal: Soft. She exhibits no distension and no mass. There is no tenderness. There is no rebound and no guarding.  Musculoskeletal: Normal range of motion. She exhibits no edema or tenderness.  Lymphadenopathy:    She has no cervical adenopathy.  Neurological: She is alert and oriented to person, place, and time. She has normal reflexes.  No cranial nerve deficit. She exhibits normal muscle tone. Coordination normal.  Normal sensation in both feet with test by nylon string.  Skin: Skin is warm and dry. No rash noted. No erythema.  Psychiatric: She has a normal mood and affect. Her behavior is normal. Judgment and thought content normal.     Depression Screen Sleeping well and anxiety well controlled with Xanax at bedtime. No suicidal ideation.    Assessment & Plan:    1. Physical exam, pre-employment Good general health. No restrictions or limitations for job. Immunizations, colonoscopy and mammograms up to date.  2. Benign essential HTN Stable and well controlled on the Exforge-HCT and occasionally will use Lasix for edema. Encouraged to limit caffeine and  sodium in diet. Continue present regimen. Recent labs on 09-04-15 shows no anemia.  3. Type 2 diabetes mellitus without complication, without long-term current use of insulin (HCC) Stable with Hgb A1C still elevated at 6.8. Treating with diet control only now. Denies polyuria, polydipsia, polyphagia and no significant change in vision. Recommend annual eye exam with ophthalmologist and podiatrist.  - POCT UA - Microalbumin  4. Pure hypercholesterolemia Slight improvement in HDL but still low at 30. Continue low fat diet and regular exercise.

## 2015-09-09 ENCOUNTER — Encounter: Payer: Self-pay | Admitting: General Surgery

## 2015-09-09 ENCOUNTER — Ambulatory Visit (INDEPENDENT_AMBULATORY_CARE_PROVIDER_SITE_OTHER): Payer: BC Managed Care – PPO | Admitting: General Surgery

## 2015-09-09 VITALS — BP 148/88 | HR 76 | Resp 14 | Ht 69.0 in | Wt 261.0 lb

## 2015-09-09 DIAGNOSIS — L02411 Cutaneous abscess of right axilla: Secondary | ICD-10-CM | POA: Diagnosis not present

## 2015-09-09 NOTE — Patient Instructions (Signed)
Call with any concerns or questions.

## 2015-09-09 NOTE — Progress Notes (Signed)
Patient ID: Miranda Moore, female   DOB: June 19, 1961, 55 y.o.   MRN: [id number]  Chief Complaint  Patient presents with  . Abscess    axilla    HPI Miranda Moore is a 55 y.o. female here today for a right axilla abscess. Patient noticed the area on Friday 09/06/15. Denies fever, chills.  The patient was seen in the outpatient surgery area on Friday, January 27. At that time a less than 1 cm area of swelling with what appeared to be a central pore was noted. Thought this would resolve with the use of hot compresses. She is not experiencing any relief and has had increasing swelling since that time. She is seen today for planned incision and drainage.  The patient denies previous episodes of dermal infection.  I personally reviewed the patient's history. HPI  Past Medical History  Diagnosis Date  . Hypertension     Past Surgical History  Procedure Laterality Date  . Abdominal hysterectomy  2002  . Laparoscopic oophorectomy  2012  . Knee surgery  2012  . Colonoscopy  2015    Family History  Problem Relation Age of Onset  . Cancer Mother     colon  . Heart failure Mother   . Heart disease Father   . Hypertension Father   . Hypertension Brother   . Breast cancer Paternal Aunt   . Heart failure Paternal Grandmother   . Dementia Paternal Grandfather     Social History Social History  Substance Use Topics  . Smoking status: Never Smoker   . Smokeless tobacco: Never Used  . Alcohol Use: 0.0 oz/week    0 Standard drinks or equivalent per week     Comment: 2-4/week    Allergies  Allergen Reactions  . Chlorpheniramine-Phenylephrine Hives  . Other Hives    actifed  . Sulfa Antibiotics   . Sulfur Other (See Comments)    Low b/p    Current Outpatient Prescriptions  Medication Sig Dispense Refill  . ALPRAZolam (XANAX) 0.5 MG tablet TAKE 1 TABLET BY MOUTH AT BEDTIME AS NEEDED FOR ANXIETY 30 tablet 1  . Amlodipine-Valsartan-HCTZ 10-160-12.5 MG TABS daily.     Marland Kitchen aspirin 81  MG tablet Take 81 mg by mouth daily.    . Calcium Carbonate-Vitamin D 600-200 MG-UNIT TABS     . Cholecalciferol (VITAMIN D-3) 1000 UNITS CAPS Take by mouth daily.    . furosemide (LASIX) 20 MG tablet TAKE 1 TABLET BY MOUTH EVERY DAY - PLEASE DISCONTINUE HYDROCHLOROTHIAZIDE WHILE TAKING LASIX 30 tablet 5  . ibuprofen (ADVIL,MOTRIN) 800 MG tablet Take by mouth.    . Omega-3 Fatty Acids (FISH OIL DOUBLE STRENGTH) 1200 MG CAPS Take by mouth.    Marland Kitchen omeprazole (PRILOSEC) 40 MG capsule Take 40 mg by mouth daily.     Marland Kitchen POTASSIUM CITRATE PO Take by mouth daily.     No current facility-administered medications for this visit.    Review of Systems Review of Systems  Constitutional: Negative.   Respiratory: Negative.   Cardiovascular: Negative.     Blood pressure 148/88, pulse 76, resp. rate 14, height 5\' 9"  (1.753 m), weight 261 lb (118.389 kg).  Physical Exam Physical Exam  Pulmonary/Chest:      Data Reviewed Hemoglobin A1c obtained earlier this month elevated at 6.8. Minimal change from previous determinations.  Assessment    Right axillary abscess of dermal source.    Plan    The area was cleansed with alcohol and 10 mL of  0.5% Xylocaine with 0.25% Marcaine with 1-200,000 epinephrine utilized well tolerated. This was supplemented with 3 mL of 1% plain Xylocaine. The area was excised through a 1 cm elliptical incision. A central core was extracted suggestive of a sebaceous cyst. Scant bleeding was noted. The cavity was rinsed with saline and a dry dressing applied. Culture obtained. We will hold on antibiotics based on the clean nature of the wound evident at this time.  The patient was instructed in regards to wound care. We'll plan for a brief follow-up in 2 days in the day surgery area at the hospital.     PCP: Vernie Murders, PA   Robert Bellow 09/09/2015, 7:19 PM

## 2015-09-13 LAB — ANAEROBIC AND AEROBIC CULTURE

## 2015-10-03 ENCOUNTER — Ambulatory Visit
Admission: RE | Admit: 2015-10-03 | Discharge: 2015-10-03 | Disposition: A | Discharge status: Home / Self Care | Payer: BC Managed Care – PPO | Source: Ambulatory Visit | Attending: Physician Assistant | Admitting: Physician Assistant

## 2015-10-03 ENCOUNTER — Encounter: Payer: Self-pay | Admitting: Physician Assistant

## 2015-10-03 ENCOUNTER — Other Ambulatory Visit: Payer: Self-pay | Admitting: Physician Assistant

## 2015-10-03 ENCOUNTER — Ambulatory Visit (INDEPENDENT_AMBULATORY_CARE_PROVIDER_SITE_OTHER): Payer: BC Managed Care – PPO | Admitting: Physician Assistant

## 2015-10-03 VITALS — BP 130/80 | HR 66 | Temp 98.2°F | Resp 16

## 2015-10-03 DIAGNOSIS — M25561 Pain in right knee: Secondary | ICD-10-CM | POA: Diagnosis not present

## 2015-10-03 DIAGNOSIS — M8588 Other specified disorders of bone density and structure, other site: Secondary | ICD-10-CM | POA: Insufficient documentation

## 2015-10-03 DIAGNOSIS — M2341 Loose body in knee, right knee: Secondary | ICD-10-CM

## 2015-10-03 DIAGNOSIS — M1711 Unilateral primary osteoarthritis, right knee: Secondary | ICD-10-CM | POA: Insufficient documentation

## 2015-10-03 MED ORDER — TRAMADOL HCL 50 MG PO TABS: 50.0000 mg | ORAL_TABLET | Freq: Four times a day (QID) | ORAL | Status: DC | PRN

## 2015-10-03 NOTE — Patient Instructions (Signed)
Patellar Dislocation and Subluxation With Phase I Rehab Injuries to the knee often include knee cap (patellar) dislocation or subluxation. The patella is a V-shaped bone that sits in a groove in the thigh bone (trochlea). A patellar dislocation occurs when the knee cap is displaced from the trochlea, and the joint surfaces are no longer touching. A subluxation is a similar injury, where the knee cap becomes displaced, but the joint surfaces are still touching. Patellar dislocations and subluxations are most common in adolescents and younger adults. SYMPTOMS   Severe pain in the front of the knee when attempting to move the knee.  A feeling of the knee giving way.  Tenderness, swelling, and bruising (contusion) of the knee.  Numbness or paralysis below the dislocation, from pinching, cutting, or pressure on the blood vessels or nerves (uncommon).  Visible deformity, especially if the dislocation of the knee cap occurs towards the outside of the knee.  Lump on the inner knee, which is the end of the inner part of the thigh bone (femur). CAUSES  Patellar dislocations are caused by a force placed on the knee cap, that is strong enough to displace the bone from its proper alignment. Common causes of injury include:  Direct hit (trauma) to the knee.  Twisting or pivoting injury to the lower limb, when the foot is planted on the ground.  Powerful muscle contraction.  Birth defect (congenital abnormality), such as shallow or malformed joint surfaces. RISK INCREASES WITH:  Contact sports (football, rugby, soccer), sports that require jumping and landing (basketball, volleyball), or sports in which cleats are worn on shoes.  People with wide pelvis, knocked knees, shallow or malformed joint surfaces.  Previous knee injury.  Poor strength and flexibility. PREVENTION  Warm up and stretch properly before activity.  Maintain physical fitness:  Strength, flexibility, and  endurance.  Cardiovascular fitness.  For jumping or contact sports, protect the knee cap with supportive devices (elastic bandages, tape, braces, knee sleeves with a hole for the patella and a built-up outer side, or straps to pull the patella inward, or knee pads).  Use cleats of proper length. PROGNOSIS  If treated properly, patellar dislocations and subluxations usually require at least 6 weeks to heal.  RELATED COMPLICATIONS   Associated fracture or joint cartilage injury.  Damage to nearby nerves or major blood vessels (rare).  Longer healing time or recurring dislocation, if activity is resumed too soon.  Excessive bleeding within the knee, due to dislocation.  Knee cap pain and giving way, often due to inadequate or incomplete rehabilitation.  Unstable or arthritic joint, following repeated injury or delayed treatment. TREATMENT Patellar dislocations and subluxations require immediate realigning of the bones (reduction). Realigning is often completed by hand. However, surgery is sometimes needed. After realignment, treatment first involves the use of ice and medicine, to reduce pain and inflammation. Elevating the injured knee above the level of the heart will also help reduce inflammation. Restraining the knee is often needed, to allow for healing, for up to 6 weeks. After restraint, it is important to perform strengthening and stretching exercises to help regain strength and a full range of motion. These exercises may be completed at home or with a therapist. MEDICATION   If pain medicine is needed, nonsteroidal anti-inflammatory medicines (aspirin and ibuprofen), or other minor pain relievers (acetaminophen), are often advised.  Do not take pain medicine for 7 days before surgery.  Prescription pain relievers may be given, if your caregiver thinks they are needed. Use only  as directed and only as much as you need. HEAT AND COLD  Cold treatment (icing) should be applied for  10 to 15 minutes every 2 to 3 hours for inflammation and pain, and immediately after activity that aggravates your symptoms. Use ice packs or an ice massage.  Heat treatment may be used before performing stretching and strengthening activities prescribed by your caregiver, physical therapist, or athletic trainer. Use a heat pack or a warm water soak. SEEK MEDICAL CARE IF:  Pain, tenderness, or swelling gets worse, despite treatment.  You experience pain, numbness, or coldness in the foot.  Blue, gray, or dark color appears in the toenails.  Any of the following signs of infection occur after surgery: fever, increased pain, swelling, redness, drainage of fluids, or bleeding in the affected area.  New, unexplained symptoms develop. (Drugs used in treatment may produce side effects.) EXERCISES PHASE I EXERCISES RANGE OF MOTION (ROM) AND STRETCHING EXERCISES - Patellar Dislocation and Subluxation Phase I  FIRST TIME DISLOCATIONS OR SUBLUXATIONS: If you have dislocated or subluxated your knee cap for the first time, your caregiver may ask you to wear a knee brace for up to 6 weeks after your injury. This brace will keep your knee completely straight so that the healing tissues are not disrupted by your knee movement. Your caregiver may allow some motion at your knee by using a hinged brace or by allowing you to take your brace off for a limited amount of time to gently bend and straighten your knee. If this is the case, closely follow your caregiver's directions, in order to allow the best recovery.   CHRONIC OR REPEATED DISLOCATIONS OR SUBLUXATIONS: If you have chronic or repeated dislocations or subluxations, your caregiver will likely not ask you to use a brace. He or she will likely have you begin gentle range of motion activities, within the range that is comfortable for you.  Once you are allowed to start moving your knee, these are some of the initial exercises that may be included in your  rehabilitation program. Continue to perform them until you see your caregiver again or until your symptoms go away. While completing these exercises, remember:   Restoring tissue flexibility helps normal motion to return to the joints. This allows healthier, less painful movement and activity.  An effective stretch should be held for at least 30 seconds.  A stretch should never be painful. You should only feel a gentle lengthening or release in the stretched tissue. RANGE OF MOTION - Knee Flexion, Active  Lie on your back with both knees straight. (If this causes back discomfort, bend your opposite knee, placing your foot flat on the floor.)  Slowly slide your heel back toward your buttocks until you feel a gentle stretch in the front of your knee or thigh.  Hold for __________ seconds. Slowly slide your heel back to the starting position. Repeat __________ times. Complete this exercise __________ times per day.  RANGE OF MOTION - Knee Flexion and Extension, Active-Assisted  Sit on the edge of a table or chair with your thighs firmly supported. It may be helpful to place a folded towel under the end of your right / left thigh.  Flexion (bending): Place the ankle of your healthy leg on top of the other ankle. Use your healthy leg to gently bend your right / left knee until you feel a mild tension across the top of your knee.  Hold for __________ seconds.  Extension (straightening): Switch your ankles  so your right / left leg is on top. Use your healthy leg to straighten your right / left knee until you feel a mild tension on the backside of your knee.  Hold for __________ seconds. Repeat __________ times. Complete this exercise __________ times per day. STRETCH - Knee Flexion, Supine  Lie on the floor with your right / left heel and foot lightly touching the wall. (Place both feet on the wall if you do not use a door frame.)  Without using any effort, allow gravity to slide your foot  down the wall slowly until you feel a gentle stretch in the front of your right / left knee.  Hold this stretch for __________ seconds. Then return the leg to the starting position, using your healthy leg for help, if needed. Repeat __________ times. Complete this stretch __________ times per day.  STRENGTHENING EXERCISES - Patellar Dislocation and Subluxation Phase I These exercises may help you when beginning to rehabilitate your injury. They may resolve your symptoms with or without further involvement from your physician, physical therapist or athletic trainer. While completing these exercises, remember:   Muscles can gain both the endurance and the strength needed for everyday activities through controlled exercises.  Complete these exercises as instructed by your physician, physical therapist or athletic trainer. Increase the resistance and repetitions only as guided by your caregiver. STRENGTH - Quadriceps, Isometrics  Lie on your back with your right / left leg extended and your opposite knee bent.  Gradually tense the muscles in the front of your right / left thigh. You should see either your knee cap slide up toward your hip or increased dimpling just above the knee. This motion will push the back of the knee down toward the floor, mat, or bed on which you are lying.  Hold the muscle as tight as you can, without increasing your pain, for __________ seconds.  Relax the muscles slowly and completely in between each repetition. Repeat __________ times. Complete this exercise __________ times per day.  STRENGTH - Quadriceps, Straight Leg Raises Quality counts! Watch for signs that the quadriceps muscle is working, to be sure you are strengthening the correct muscles and not "cheating" by substituting with healthier muscles.  Lay on your back with your right / left leg extended and your opposite knee bent.  Tense the muscles in the front of your right / left thigh. You should see either  your knee cap slide up or increased dimpling just above the knee. Your thigh may even shake a bit.  Tighten these muscles even more and raise your leg 4 to 6 inches off the floor. Hold for __________ seconds.  Keeping these muscles tense, lower your leg.  Relax the muscles slowly and completely between each repetition. Repeat __________ times. Complete this exercise __________ times per day.  STRENGTH - Hip Abductors, Straight Leg Raises Be aware of your form throughout the entire exercise, so that you exercise the correct muscles. Poor form means that you are not strengthening the correct muscles.  Lie on your side so that your head, shoulders, knee and hip line up. You may bend your lower knee to help maintain your balance. Your right / left leg should be on top.  Roll your hips slightly forward, so that your hips are stacked directly over each other and your right / left knee is facing forward.  Lift your top leg up 4-6 inches, leading with your heel. Be sure that your foot does not drift forward  or that your knee does not roll toward the ceiling.  Hold this position for __________ seconds. You should feel the muscles in your outer hip lifting. (You may not notice this until your leg begins to tire.)  Slowly lower your leg to the starting position. Allow the muscles to fully relax before beginning the next repetition. Repeat __________ times. Complete this exercise __________ times per day.  STRENGTH - Hip Extensors, Straight Leg Raises  Lie on your stomach on a firm surface.  Tense the muscles in your buttocks to lift your right / left leg about 4 inches. If you cannot lift your leg this high without arching your back, place a pillow under your hips.  Keep your knee straight. Hold __________ seconds.  Slowly lower your leg to the starting position and allow it to relax completely before completing the next repetition. Repeat __________ times. Complete this exercise __________ times  per day.  STRENGTH - Hip Adductors, Straight Leg Raises  Lie on your side so that your head, shoulders, knee and hip line up. You may place your upper foot in front, to help maintain your balance. Your right / left leg should be on the bottom.  Roll your hips slightly forward, so that your hips are stacked directly over each other and your right / left knee is facing forward.  Tense the muscles in your inner thigh and lift your bottom leg 4-6 inches. Hold this position for __________ seconds.  Slowly lower your leg to the starting position. Allow the muscles to fully relax before beginning the next repetition. Repeat __________ times. Complete this exercise __________ times per day.    This information is not intended to replace advice given to you by your health care provider. Make sure you discuss any questions you have with your health care provider.   Document Released: 07/27/2005 Document Revised: 04/17/2015 Document Reviewed: 11/08/2008 Elsevier Interactive Patient Education [employer].

## 2015-10-03 NOTE — Progress Notes (Signed)
Patient: Miranda Moore Female    DOB: 08/07/1961   55 y.o.   MRN: [id number] Visit Date: 10/03/2015  Today's Provider: Mar Daring, PA-C   Chief Complaint  Patient presents with  . Knee Pain   Subjective:    Knee Pain  The incident occurred 12 to 24 hours ago. The incident occurred at home. There was no injury mechanism. The pain is present in the right knee. The quality of the pain is described as stabbing. The pain is at a severity of 10/10. The pain is severe. The pain has been constant since onset. Associated symptoms include an inability to bear weight. Associated symptoms comments: Hurts to bend. knee is swollen. She has a knee brace. She reports no foreign bodies present. The symptoms are aggravated by movement and weight bearing. She has tried ice (Ibuprofen) for the symptoms. The treatment provided no relief.  States she feels like her knee cap went out of joint and causes a lot of pressure behind and around her knee cap when she tries to bend her knee.  She does have positive history of meniscal tear in that knee as well approx 5 years ago. She underwent laparoscopic repair by Dr. Tamala Julian at that time.  She has not had many issues since then until now.      Allergies  Allergen Reactions  . Chlorpheniramine-Phenylephrine Hives  . Other Hives    actifed  . Sulfa Antibiotics   . Sulfur Other (See Comments)    Low b/p   Previous Medications   ALPRAZOLAM (XANAX) 0.5 MG TABLET    TAKE 1 TABLET BY MOUTH AT BEDTIME AS NEEDED FOR ANXIETY   AMLODIPINE-VALSARTAN-HCTZ 10-160-12.5 MG TABS    daily.    ASPIRIN 81 MG TABLET    Take 81 mg by mouth daily.   CALCIUM CARBONATE-VITAMIN D 600-200 MG-UNIT TABS       CHOLECALCIFEROL (VITAMIN D-3) 1000 UNITS CAPS    Take by mouth daily.   FUROSEMIDE (LASIX) 20 MG TABLET    TAKE 1 TABLET BY MOUTH EVERY DAY - PLEASE DISCONTINUE HYDROCHLOROTHIAZIDE WHILE TAKING LASIX   OMEGA-3 FATTY ACIDS (FISH OIL DOUBLE STRENGTH) 1200 MG CAPS     Take by mouth.   OMEPRAZOLE (PRILOSEC) 40 MG CAPSULE    Take 40 mg by mouth daily.    POTASSIUM CITRATE PO    Take by mouth daily.    Review of Systems  Constitutional: Negative for fever and fatigue.  HENT: Negative.   Respiratory: Negative.   Cardiovascular: Negative.   Gastrointestinal: Negative.   Musculoskeletal: Positive for joint swelling, arthralgias and gait problem.  Skin: Negative for color change and rash.    Social History  Substance Use Topics  . Smoking status: Never Smoker   . Smokeless tobacco: Never Used  . Alcohol Use: 0.0 oz/week    0 Standard drinks or equivalent per week     Comment: 2-4/week   Objective:   BP 130/80 mmHg  Pulse 66  Temp(Src) 98.2 F (36.8 C) (Oral)  Resp 16  Wt   Physical Exam  Constitutional: She appears well-developed and well-nourished. No distress.  Cardiovascular: Normal rate, regular rhythm and normal heart sounds.  Exam reveals no gallop and no friction rub.   No murmur heard. Pulmonary/Chest: Effort normal and breath sounds normal. No respiratory distress. She has no wheezes. She has no rales.  Musculoskeletal:       Right knee: She exhibits decreased range of motion,  swelling and effusion. She exhibits no deformity, no erythema, normal alignment and no bony tenderness. Tenderness found. Medial joint line, lateral joint line and patellar tendon tenderness noted.       Left knee: Normal.  Very limited ROM.  She was only able to flex the right knee approx 30 degrees before having to straighten back out. She was unable to tolerate any testing for ligament strain or meniscal injury. Mild joint effusion and swelling noted.  Positive apprehension with any patellar movement. Negative ballotable patella. Neurovasc grossly intact.  Skin: She is not diaphoretic.  Vitals reviewed.       Assessment & Plan:     1. Right knee pain [M25.561] Question subluxation of patella vs cartilage or foreign body in joint space limiting movement.  Recommended immobilizer.  We did not have in our office so I used the brace she had and advised her to see if she could get one from work, pharmacy or medical supply store. Advised to continue icing the knee at least 3-4 times daily. Keep knee elevated.  Limit bending and no lifting greater than 10 pounds. Tramadol given for pain.  Will get xray to R/O bony abnormality or foreign body in joint space.  Will attempt to get her an appointment with [facility] since this is where she has been seen in the past. If unable to get appointment she is willing to see Sampson Si. I will follow up with her pending the xray results and see her back in one week to re-evaluate if she has not had an ortho appointment by then.  She is to call if symptoms worsen in the meantime. - DG Knee 4 Views W/Patella Right (sunrise view, tunnel view); Future - traMADol (ULTRAM) 50 MG tablet; Take 1 tablet (50 mg total) by mouth every 6 (six) hours as needed.  Dispense: 45 tablet; Refill: 0       Mar Daring, PA-C  [facility]

## 2015-10-04 ENCOUNTER — Other Ambulatory Visit: Payer: Self-pay | Admitting: Physician Assistant

## 2015-10-04 DIAGNOSIS — M2341 Loose body in knee, right knee: Secondary | ICD-10-CM

## 2015-10-07 ENCOUNTER — Telehealth: Payer: Self-pay | Admitting: Physician Assistant

## 2015-10-07 ENCOUNTER — Ambulatory Visit
Admission: RE | Admit: 2015-10-07 | Discharge: 2015-10-07 | Disposition: A | Discharge status: Home / Self Care | Payer: BC Managed Care – PPO | Source: Ambulatory Visit | Attending: Physician Assistant | Admitting: Physician Assistant

## 2015-10-07 DIAGNOSIS — M2341 Loose body in knee, right knee: Secondary | ICD-10-CM

## 2015-10-07 DIAGNOSIS — M25861 Other specified joint disorders, right knee: Secondary | ICD-10-CM | POA: Insufficient documentation

## 2015-10-07 DIAGNOSIS — M1711 Unilateral primary osteoarthritis, right knee: Secondary | ICD-10-CM | POA: Diagnosis not present

## 2015-10-07 DIAGNOSIS — M7121 Synovial cyst of popliteal space [Baker], right knee: Secondary | ICD-10-CM | POA: Diagnosis not present

## 2015-10-07 MED ORDER — HYDROCODONE-ACETAMINOPHEN 5-325 MG PO TABS: 1.0000 | ORAL_TABLET | Freq: Four times a day (QID) | ORAL | Status: DC | PRN

## 2015-10-07 MED ORDER — GADOBENATE DIMEGLUMINE 529 MG/ML IV SOLN
20.0000 mL | Freq: Once | INTRAVENOUS | Status: AC | PRN
  Administered 2015-10-07: 20 mL via INTRAVENOUS

## 2015-10-07 NOTE — Telephone Encounter (Signed)
Please advise thanks.

## 2015-10-07 NOTE — Telephone Encounter (Signed)
Rx for Vicodin printed and up front for pick up.

## 2015-10-07 NOTE — Telephone Encounter (Signed)
Patient pick up the prescriptions.  Thanks,  -Joseline

## 2015-10-07 NOTE — Telephone Encounter (Signed)
Pt stated that the traMADol (ULTRAM) 50 MG tablet isn't helping with the pain. Pt stated that she has been up since 2 this morning. Pt stated that the only thing the medication is doing is making her feel sick to her stomach. Pt wanted to see if she could get something else. Please advise. Thanks TNP

## 2015-10-07 NOTE — Telephone Encounter (Signed)
LMTCB  Thanks,  -Joseline 

## 2015-10-10 ENCOUNTER — Ambulatory Visit: Payer: BC Managed Care – PPO | Admitting: Physician Assistant

## 2015-12-24 ENCOUNTER — Other Ambulatory Visit: Payer: Self-pay

## 2015-12-24 DIAGNOSIS — F419 Anxiety disorder, unspecified: Secondary | ICD-10-CM

## 2015-12-24 MED ORDER — ALPRAZOLAM 0.5 MG PO TABS: ORAL_TABLET | ORAL | Status: DC

## 2015-12-24 NOTE — Telephone Encounter (Signed)
Will call in one refill of Alprazolam. Due for recheck of BP and diabetes in the next month before further refills needed.

## 2015-12-24 NOTE — Telephone Encounter (Signed)
Patient states she has 2 bottles of BP medication left so she doesn't need a refill right now.   Patient is requesting a refill on Alprazolam be called in at [facility].

## 2015-12-24 NOTE — Telephone Encounter (Signed)
RX called in at [facility]. Patient advised of message below.

## 2015-12-24 NOTE — Telephone Encounter (Signed)
[facility] sent a refill request for Amlodipine/Valsartan/HCTZ 10-160-12.5 mg.   Simona Huh wanted to know if patient is out of medication and requesting refills? Request from pharmacy states information is based on retail and mail prescription fill dates.   LMTCB

## 2016-04-23 ENCOUNTER — Encounter: Payer: Self-pay | Admitting: Family Medicine

## 2016-04-23 ENCOUNTER — Ambulatory Visit (INDEPENDENT_AMBULATORY_CARE_PROVIDER_SITE_OTHER): Payer: BC Managed Care – PPO | Admitting: Family Medicine

## 2016-04-23 VITALS — BP 116/72 | HR 76 | Temp 97.8°F | Resp 16 | Wt 255.2 lb

## 2016-04-23 DIAGNOSIS — M10072 Idiopathic gout, left ankle and foot: Secondary | ICD-10-CM | POA: Diagnosis not present

## 2016-04-23 DIAGNOSIS — M109 Gout, unspecified: Secondary | ICD-10-CM

## 2016-04-23 MED ORDER — PREDNISONE 20 MG PO TABS: ORAL_TABLET | ORAL | 0 refills | Status: DC

## 2016-04-23 NOTE — Progress Notes (Signed)
Subjective:     Patient ID: Miranda Moore, female   DOB: Sep 27, 1960, 55 y.o.   MRN: [id number]  HPI  Chief Complaint  Patient presents with  . Toe Pain    Patient comes in office today with concerns of great toe pain on her left foot for the past 7 days or more. She complains of tenderness and swelling around toe. Patient reorts taking otc Tylenol and prescription Vicodin and Meloxicam with no relief.   States this has happened before on the same toe but got better quickly.Denies injury. "I can/t wear my tennis shoes"   Review of Systems     Objective:   Physical Exam  Constitutional: She appears well-developed and well-nourished. No distress.  Musculoskeletal:  Left first MT joint swollen, warm, mildly erythematous and warm to the touch.       Assessment:    1. 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

## 2016-04-23 NOTE — Patient Instructions (Addendum)
Please get labs once your gout flares down. Gout Gout is an inflammatory arthritis caused by a buildup of uric acid crystals in the joints. Uric acid is a chemical that is normally present in the blood. When the level of uric acid in the blood is too high it can form crystals that deposit in your joints and tissues. This causes joint redness, soreness, and swelling (inflammation). Repeat attacks are common. Over time, uric acid crystals can form into masses (tophi) near a joint, destroying bone and causing disfigurement. Gout is treatable and often preventable. CAUSES  The disease begins with elevated levels of uric acid in the blood. Uric acid is produced by your body when it breaks down a naturally found substance called purines. Certain foods you eat, such as meats and fish, contain high amounts of purines. Causes of an elevated uric acid level include:  Being passed down from parent to child (heredity).  Diseases that cause increased uric acid production (such as obesity, psoriasis, and certain cancers).  Excessive alcohol use.  Diet, especially diets rich in meat and seafood.  Medicines, including certain cancer-fighting medicines (chemotherapy), water pills (diuretics), and aspirin.  Chronic kidney disease. The kidneys are no longer able to remove uric acid well.  Problems with metabolism. Conditions strongly associated with gout include:  Obesity.  High blood pressure.  High cholesterol.  Diabetes. Not everyone with elevated uric acid levels gets gout. It is not understood why some people get gout and others do not. Surgery, joint injury, and eating too much of certain foods are some of the factors that can lead to gout attacks. SYMPTOMS   An attack of gout comes on quickly. It causes intense pain with redness, swelling, and warmth in a joint.  Fever can occur.  Often, only one joint is involved. Certain joints are more commonly involved:  Base of the big  toe.  Knee.  Ankle.  Wrist.  Finger. Without treatment, an attack usually goes away in a few days to weeks. Between attacks, you usually will not have symptoms, which is different from many other forms of arthritis. DIAGNOSIS  Your caregiver will suspect gout based on your symptoms and exam. In some cases, tests may be recommended. The tests may include:  Blood tests.  Urine tests.  X-rays.  Joint fluid exam. This exam requires a needle to remove fluid from the joint (arthrocentesis). Using a microscope, gout is confirmed when uric acid crystals are seen in the joint fluid. TREATMENT  There are two phases to gout treatment: treating the sudden onset (acute) attack and preventing attacks (prophylaxis).  Treatment of an Acute Attack.  Medicines are used. These include anti-inflammatory medicines or steroid medicines.  An injection of steroid medicine into the affected joint is sometimes necessary.  The painful joint is rested. Movement can worsen the arthritis.  You may use warm or cold treatments on painful joints, depending which works best for you.  Treatment to Prevent Attacks.  If you suffer from frequent gout attacks, your caregiver may advise preventive medicine. These medicines are started after the acute attack subsides. These medicines either help your kidneys eliminate uric acid from your body or decrease your uric acid production. You may need to stay on these medicines for a very long time.  The early phase of treatment with preventive medicine can be associated with an increase in acute gout attacks. For this reason, during the first few months of treatment, your caregiver may also advise you to take medicines usually  used for acute gout treatment. Be sure you understand your caregiver's directions. Your caregiver may make several adjustments to your medicine dose before these medicines are effective.  Discuss dietary treatment with your caregiver or dietitian.  Alcohol and drinks high in sugar and fructose and foods such as meat, poultry, and seafood can increase uric acid levels. Your caregiver or dietitian can advise you on drinks and foods that should be limited. HOME CARE INSTRUCTIONS   Do not take aspirin to relieve pain. This raises uric acid levels.  Only take over-the-counter or prescription medicines for pain, discomfort, or fever as directed by your caregiver.  Rest the joint as much as possible. When in bed, keep sheets and blankets off painful areas.  Keep the affected joint raised (elevated).  Apply warm or cold treatments to painful joints. Use of warm or cold treatments depends on which works best for you.  Use crutches if the painful joint is in your leg.  Drink enough fluids to keep your urine clear or pale yellow. This helps your body get rid of uric acid. Limit alcohol, sugary drinks, and fructose drinks.  Follow your dietary instructions. Pay careful attention to the amount of protein you eat. Your daily diet should emphasize fruits, vegetables, whole grains, and fat-free or low-fat milk products. Discuss the use of coffee, vitamin C, and cherries with your caregiver or dietitian. These may be helpful in lowering uric acid levels.  Maintain a healthy body weight. SEEK MEDICAL CARE IF:   You develop diarrhea, vomiting, or any side effects from medicines.  You do not feel better in 24 hours, or you are getting worse. SEEK IMMEDIATE MEDICAL CARE IF:   Your joint becomes suddenly more tender, and you have chills or a fever. MAKE SURE YOU:   Understand these instructions.  Will watch your condition.  Will get help right away if you are not doing well or get worse.   This information is not intended to replace advice given to you by your health care provider. Make sure you discuss any questions you have with your health care provider.   Document Released: 07/24/2000 Document Revised: 08/17/2014 Document Reviewed:  03/09/2012 Elsevier Interactive Patient Education [employer].

## 2016-05-01 ENCOUNTER — Other Ambulatory Visit: Payer: Self-pay

## 2016-05-01 DIAGNOSIS — F419 Anxiety disorder, unspecified: Secondary | ICD-10-CM

## 2016-05-01 NOTE — Telephone Encounter (Signed)
Mail order pharmacy requesting refills.

## 2016-06-10 ENCOUNTER — Other Ambulatory Visit: Payer: Self-pay | Admitting: Family Medicine

## 2016-06-10 DIAGNOSIS — R600 Localized edema: Secondary | ICD-10-CM

## 2016-06-24 ENCOUNTER — Encounter: Payer: Self-pay | Admitting: Family Medicine

## 2016-06-24 ENCOUNTER — Ambulatory Visit (INDEPENDENT_AMBULATORY_CARE_PROVIDER_SITE_OTHER): Payer: BC Managed Care – PPO | Admitting: Family Medicine

## 2016-06-24 VITALS — BP 138/82 | HR 80 | Temp 97.9°F | Resp 20 | Wt 258.0 lb

## 2016-06-24 DIAGNOSIS — F439 Reaction to severe stress, unspecified: Secondary | ICD-10-CM | POA: Diagnosis not present

## 2016-06-24 DIAGNOSIS — F329 Major depressive disorder, single episode, unspecified: Secondary | ICD-10-CM

## 2016-06-24 DIAGNOSIS — F32A Depression, unspecified: Secondary | ICD-10-CM

## 2016-06-24 MED ORDER — SERTRALINE HCL 50 MG PO TABS: 50.0000 mg | ORAL_TABLET | Freq: Every day | ORAL | 0 refills | Status: DC

## 2016-06-24 MED ORDER — ALPRAZOLAM 0.5 MG PO TABS: ORAL_TABLET | ORAL | 0 refills | Status: DC

## 2016-06-24 NOTE — Progress Notes (Signed)
Subjective:     Patient ID: Miranda Moore, female   DOB: [birth year], 55 y.o.   MRN: [id number]  HPI  Chief Complaint  Patient presents with  . Stress    Pt c/o work related stress. Sx include muscle aches, headache, chest pain. Has been occuring x 3 months. Pt takes Alprazolam 0.5 mg po qd, without relief of sx. Is a daily problem. Pt reports she got fired from [employer], was rehired but has been dealing with daily stressors ever since.  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 Feels depressed and stressed but no SI. Has not been on other medication except Xanax in the past.   Review of Systems     Objective:   Physical Exam  Constitutional: She appears well-developed and well-nourished. She appears distressed (tearful ).  Cardiovascular: Normal rate and regular rhythm.   Pulmonary/Chest: Breath sounds normal.       Assessment:    1. Depression, unspecified depression type - sertraline (ZOLOFT) 50 MG tablet; Take 1 tablet (50 mg total) by mouth daily. Start at 1/2 pill for the first 5 days.  Dispense: 30 tablet; Refill: 0  2. Situational stress - sertraline (ZOLOFT) 50 MG tablet; Take 1 tablet (50 mg total) by mouth daily. Start at 1/2 pill for the first 5 days.  Dispense: 30 tablet; Refill: 0 - ALPRAZolam (XANAX) 0.5 MG tablet; TAKE 1-2 three x day for anxiety/stress  Dispense: 30 tablet; Refill: 0      Plan:    F/u with her primary provider, Simona Huh, in two weeks. May call sooner if not tolerating medication.

## 2016-06-24 NOTE — Patient Instructions (Signed)
Start the  Sertraline at 1/2 pill for the first 5 days then go to a whole pill.

## 2016-07-06 ENCOUNTER — Ambulatory Visit: Payer: BC Managed Care – PPO | Admitting: Family Medicine

## 2016-07-06 ENCOUNTER — Encounter: Payer: Self-pay | Admitting: Family Medicine

## 2016-07-06 ENCOUNTER — Other Ambulatory Visit: Payer: Self-pay | Admitting: Family Medicine

## 2016-07-06 ENCOUNTER — Ambulatory Visit (INDEPENDENT_AMBULATORY_CARE_PROVIDER_SITE_OTHER): Payer: BC Managed Care – PPO | Admitting: Family Medicine

## 2016-07-06 ENCOUNTER — Telehealth: Payer: Self-pay | Admitting: Family Medicine

## 2016-07-06 DIAGNOSIS — F439 Reaction to severe stress, unspecified: Secondary | ICD-10-CM

## 2016-07-06 DIAGNOSIS — F329 Major depressive disorder, single episode, unspecified: Secondary | ICD-10-CM | POA: Diagnosis not present

## 2016-07-06 DIAGNOSIS — F32A Depression, unspecified: Secondary | ICD-10-CM

## 2016-07-06 MED ORDER — SERTRALINE HCL 50 MG PO TABS: 50.0000 mg | ORAL_TABLET | Freq: Every day | ORAL | 1 refills | Status: DC

## 2016-07-06 MED ORDER — ALPRAZOLAM 0.5 MG PO TABS: ORAL_TABLET | ORAL | 1 refills | Status: DC

## 2016-07-06 NOTE — Progress Notes (Deleted)
   Patient: Miranda Moore Female    DOB: 1961-04-22   55 y.o.   MRN: [id number] Visit Date: 07/06/2016  Today's Provider: Vernie Murders, PA   No chief complaint on file.  Subjective:    Depression         This is a chronic problem.  The current episode started more than 1 month ago.   Treatments tried: started Zoloft 50 mg and continued Alprazolam 0.5 mg.  Compliance with treatment is good.     Previous Medications   ALPRAZOLAM (XANAX) 0.5 MG TABLET    TAKE 1-2 three x day for anxiety/stress   AMLODIPINE-VALSARTAN-HCTZ 10-160-12.5 MG TABS    daily.    ASPIRIN 81 MG TABLET    Take 81 mg by mouth daily.   CALCIUM CARBONATE-VITAMIN D 600-200 MG-UNIT TABS       CHOLECALCIFEROL (VITAMIN D-3) 1000 UNITS CAPS    Take by mouth daily.   FUROSEMIDE (LASIX) 20 MG TABLET    TAKE 1 TABLET DAILY AS     NEEDED FOR EDEMA   MELOXICAM (MOBIC) 15 MG TABLET       OMEGA-3 FATTY ACIDS (FISH OIL DOUBLE STRENGTH) 1200 MG CAPS    Take by mouth.   OMEPRAZOLE (PRILOSEC) 40 MG CAPSULE    Take 40 mg by mouth daily.    POTASSIUM CITRATE PO    Take by mouth daily.   SERTRALINE (ZOLOFT) 50 MG TABLET    Take 1 tablet (50 mg total) by mouth daily. Start at 1/2 pill for the first 5 days.    Review of Systems  Psychiatric/Behavioral: Positive for depression.    Social History  Substance Use Topics  . Smoking status: Never Smoker  . Smokeless tobacco: Never Used  . Alcohol use 0.0 oz/week     Comment: 2-4/week   Objective:   There were no vitals taken for this visit.  Physical Exam      Assessment & Plan:       Follow up: No Follow-up on file.

## 2016-07-06 NOTE — Telephone Encounter (Signed)
Alprazolam can't be prescribed electronically. Will write prescription for pick up.

## 2016-07-06 NOTE — Progress Notes (Addendum)
Patient: Miranda Moore Female    DOB: 1961/08/07   55 y.o.   MRN: [id number] Visit Date: 07/06/2016  Today's Provider: Vernie Murders, PA   No chief complaint on file.  Subjective:    HPI  Depression Follow Up:  Patient is here for a 2 week follow up. She started Zoloft 50 mg on 06/24/2016 and was advised to continue Alprazolam 0.5 mg TID. Patient reports fair compliance with treatment plan. Patient states she only takes Alprazolam BID. She reports medication has provided mild relief of feeling depressed and stressed.   Patient Active Problem List   Diagnosis Date Noted  . Abscess of axilla, right 09/09/2015  . Abortion, spontaneous 05/31/2015  . Edema leg 05/31/2015  . Chest discomfort 05/31/2015  . Cough 05/31/2015  . Diabetes mellitus ([place]) 05/31/2015  . Accumulation of fluid in tissues 05/31/2015  . Big thyroid 05/31/2015  . Cardiac murmur 05/31/2015  . Hot flash, menopausal 05/31/2015  . LBP (low back pain) 05/31/2015  . Acute thoracic back pain 05/31/2015  . Multinodular goiter 05/31/2015  . Adiposity 05/31/2015  . Abnormal blood sugar 05/31/2015  . Cutaneous eruption 05/31/2015  . Avitaminosis D 05/31/2015  . Anxiety 02/15/2015  . Accessory skin tags 11/29/2014  . Esophagitis, reflux 04/27/2008  . Other general symptoms and signs 10/05/2006  . Pure hypercholesterolemia 09/29/2006  . 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 Acute stress disorder 02/18/2006  . 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 Bone/cartilage disorder 07/29/2005  . Benign essential HTN 07/10/2005   Past Surgical History:  Procedure Laterality Date  . ABDOMINAL HYSTERECTOMY  2002  . COLONOSCOPY  2015  . KNEE SURGERY  2012  . LAPAROSCOPIC OOPHORECTOMY  2012   Family History  Problem Relation Age of Onset  . Cancer Mother     colon  . Heart failure Mother   . Heart disease Father   . Hypertension Father   . Hypertension Brother   . Heart failure Paternal  Grandmother   . Dementia Paternal Grandfather   . Breast cancer Paternal Aunt    Allergies  Allergen Reactions  . Chlorpheniramine-Phenylephrine Hives  . Other Hives    actifed  . Sulfa Antibiotics   . Sulfur Other (See Comments)    Low b/p     Previous Medications   ALPRAZOLAM (XANAX) 0.5 MG TABLET    TAKE 1-2 three x day for anxiety/stress   AMLODIPINE-VALSARTAN-HCTZ 10-160-12.5 MG TABS    daily.    ASPIRIN 81 MG TABLET    Take 81 mg by mouth daily.   CALCIUM CARBONATE-VITAMIN D 600-200 MG-UNIT TABS       CHOLECALCIFEROL (VITAMIN D-3) 1000 UNITS CAPS    Take by mouth daily.   FUROSEMIDE (LASIX) 20 MG TABLET    TAKE 1 TABLET DAILY AS     NEEDED FOR EDEMA   MELOXICAM (MOBIC) 15 MG TABLET       OMEGA-3 FATTY ACIDS (FISH OIL DOUBLE STRENGTH) 1200 MG CAPS    Take by mouth.   OMEPRAZOLE (PRILOSEC) 40 MG CAPSULE    Take 40 mg by mouth daily.    POTASSIUM CITRATE PO    Take by mouth daily.   SERTRALINE (ZOLOFT) 50 MG TABLET    Take 1 tablet (50 mg total) by mouth daily. Start at 1/2 pill for the first 5 days.    Review of Systems  Constitutional: Negative.   Respiratory: Negative.   Cardiovascular: Negative.   Psychiatric/Behavioral: Positive for dysphoric mood.  Social History  Substance Use Topics  . Smoking status: Never Smoker  . Smokeless tobacco: Never Used  . Alcohol use 0.0 oz/week     Comment: 2-4/week   Objective:   There were no vitals taken for this visit.  Physical Exam  Constitutional: She is oriented to person, place, and time. She appears well-developed and well-nourished. No distress.  HENT:  Head: Normocephalic and atraumatic.  Right Ear: Hearing normal.  Left Ear: Hearing normal.  Nose: Nose normal.  Eyes: Conjunctivae and lids are normal. Right eye exhibits no discharge. Left eye exhibits no discharge. No scleral icterus.  Pulmonary/Chest: Effort normal. No respiratory distress.  Musculoskeletal: Normal range of motion.  Neurological: She is  alert and oriented to person, place, and time.  Skin: Skin is intact. No lesion and no rash noted.  Psychiatric: Her speech is normal and behavior is normal. Thought content normal. Her affect is blunt. She expresses no suicidal plans.    Assessment & Plan:     1. Depression, unspecified depression type Slowly improved. Less crying spells, sleeping well and fair appetite. Will continue same dose of Zoloft and recheck in 6 weeks. Recommend 3 balanced meal and some walking exercise regularly. - sertraline (ZOLOFT) 50 MG tablet; Take 1 tablet (50 mg total) by mouth daily.  Dispense: 30 tablet; Refill: 1  2. Situational stress Concern about 6 months probation at work and unsure of the reason. Feels medication starting to help with coping. Recheck as planned. - sertraline (ZOLOFT) 50 MG tablet; Take 1 tablet (50 mg total) by mouth daily.  Dispense: 30 tablet; Refill: 1

## 2016-07-06 NOTE — Telephone Encounter (Signed)
Pt contacted office for refill request on the following medications:ALPRAZolam (XANAX) 0.5 MG tablet.  [facility] mail order.  CB#[phone]/MW

## 2016-07-07 NOTE — Telephone Encounter (Signed)
Patient advised.

## 2016-08-16 ENCOUNTER — Other Ambulatory Visit: Payer: Self-pay | Admitting: Family Medicine

## 2016-08-16 DIAGNOSIS — F439 Reaction to severe stress, unspecified: Secondary | ICD-10-CM

## 2016-08-16 DIAGNOSIS — F32A Depression, unspecified: Secondary | ICD-10-CM

## 2016-08-16 DIAGNOSIS — F329 Major depressive disorder, single episode, unspecified: Secondary | ICD-10-CM

## 2016-08-17 ENCOUNTER — Ambulatory Visit: Payer: BC Managed Care – PPO | Admitting: Family Medicine

## 2016-08-17 NOTE — Telephone Encounter (Signed)
See refill notice

## 2016-09-01 ENCOUNTER — Other Ambulatory Visit: Payer: Self-pay | Admitting: Family Medicine

## 2016-09-01 DIAGNOSIS — F32A Depression, unspecified: Secondary | ICD-10-CM

## 2016-09-01 DIAGNOSIS — F329 Major depressive disorder, single episode, unspecified: Secondary | ICD-10-CM

## 2016-09-01 DIAGNOSIS — F439 Reaction to severe stress, unspecified: Secondary | ICD-10-CM

## 2016-09-07 ENCOUNTER — Other Ambulatory Visit: Payer: Self-pay

## 2016-09-07 DIAGNOSIS — F439 Reaction to severe stress, unspecified: Secondary | ICD-10-CM

## 2016-09-07 DIAGNOSIS — F32A Depression, unspecified: Secondary | ICD-10-CM

## 2016-09-07 DIAGNOSIS — F329 Major depressive disorder, single episode, unspecified: Secondary | ICD-10-CM

## 2016-09-07 MED ORDER — SERTRALINE HCL 50 MG PO TABS: 50.0000 mg | ORAL_TABLET | Freq: Every day | ORAL | 1 refills | Status: DC

## 2016-09-07 NOTE — Telephone Encounter (Signed)
Pharmacy requesting refill Please review. Thank you. sd

## 2016-09-18 ENCOUNTER — Other Ambulatory Visit: Payer: Self-pay | Admitting: Family Medicine

## 2016-09-18 NOTE — Telephone Encounter (Signed)
LOV 07/06/2016. Miranda Moore, CMA

## 2016-10-14 ENCOUNTER — Other Ambulatory Visit: Payer: Self-pay | Admitting: Family Medicine

## 2016-10-14 DIAGNOSIS — F439 Reaction to severe stress, unspecified: Secondary | ICD-10-CM

## 2016-10-14 NOTE — Telephone Encounter (Signed)
See refill note

## 2016-10-15 NOTE — Telephone Encounter (Signed)
Advise patient refill printed for pick up and should schedule follow up appointment in the next month.

## 2016-10-16 NOTE — Telephone Encounter (Signed)
Tried to call pt. VM not set up yet.

## 2016-11-10 ENCOUNTER — Ambulatory Visit (INDEPENDENT_AMBULATORY_CARE_PROVIDER_SITE_OTHER): Payer: BC Managed Care – PPO | Admitting: Family Medicine

## 2016-11-10 ENCOUNTER — Encounter: Payer: Self-pay | Admitting: Family Medicine

## 2016-11-10 VITALS — BP 136/92 | HR 63 | Temp 98.3°F | Wt 238.0 lb

## 2016-11-10 DIAGNOSIS — F419 Anxiety disorder, unspecified: Secondary | ICD-10-CM | POA: Diagnosis not present

## 2016-11-10 DIAGNOSIS — K2100 Gastro-esophageal reflux disease with esophagitis, without bleeding: Secondary | ICD-10-CM

## 2016-11-10 DIAGNOSIS — E119 Type 2 diabetes mellitus without complications: Secondary | ICD-10-CM

## 2016-11-10 DIAGNOSIS — E7800 Pure hypercholesterolemia, unspecified: Secondary | ICD-10-CM | POA: Diagnosis not present

## 2016-11-10 DIAGNOSIS — I10 Essential (primary) hypertension: Secondary | ICD-10-CM

## 2016-11-10 LAB — POCT UA - MICROALBUMIN: Microalbumin Ur, POC: 20 mg/L

## 2016-11-10 LAB — POCT GLYCOSYLATED HEMOGLOBIN (HGB A1C): Hemoglobin A1C: 6.1

## 2016-11-10 NOTE — Progress Notes (Signed)
Patient: Miranda Moore Female    DOB: [birth year]   56 y.o.   MRN: [id number] Visit Date: 11/10/2016  Today's Provider: Vernie Murders, PA   Chief Complaint  Patient presents with  . Diabetes  . Hypertension  . Hyperlipidemia  . Depression  . Follow-up   Subjective:    HPI Depression Follow Up:  Patient is here for a 4 month follow up. Patient was advised to continue Zoloft 50 mg on and continue Alprazolam 0.5 mg TID. Recommended 3 balanced meals daily and walking for exercise. Patient reports fair compliance with treatment plan. She reports medication has provided mild relief of feeling depressed and stressed, but is having break through symptoms. PHQ-9 score is a 8.   Diabetes Mellitus Type II, Follow-up:   Lab Results  Component Value Date   HGBA1C 6.8 (H) 09/04/2015   HGBA1C 6.8 (H) 06/28/2014   HGBA1C 6.7 (H) 01/20/2012   Last seen for diabetes 1 years ago.  Management since then includes continue medications and low fat diet and exercise. She reports good compliance with treatment. She is not having side effects.  Current symptoms include none  Home blood sugar records: fasting range: 125-150  Episodes of hypoglycemia? no   Current Insulin Regimen: none Weight trend: stable Current diet: in general, a "healthy" diet   Current exercise: walking at work  ------------------------------------------------------------------------   Hypertension, follow-up:  BP Readings from Last 3 Encounters:  11/10/16 (!) 136/92  07/06/16 138/88  06/24/16 138/82    She was last seen for hypertension 1 years ago.  BP at that visit was 124/68. Management since that visit includes continue medications and low fat diet and exercise..She reports good compliance with treatment. She is not having side effects.  She is exercising, walking at work. She is adherent to low salt diet.   Outside blood pressures are not being checked. She is experiencing none.  Patient denies chest  pain, chest pressure/discomfort, irregular heart beat and palpitations.   Cardiovascular risk factors include diabetes mellitus, dyslipidemia, hypertension and obesity (BMI >= 30 kg/m2).  Use of agents associated with hypertension: none.   ------------------------------------------------------------------------    Lipid/Cholesterol, Follow-up:   Last seen for this 1 years ago.  Management since that visit includes  continue medications and low fat diet and exercise..  Last Lipid Panel:    Component Value Date/Time   CHOL 166 09/04/2015 0651   CHOL 173 06/28/2014 0725   TRIG 137 09/04/2015 0651   TRIG 111 06/28/2014 0725   HDL 30 (L) 09/04/2015 0651   HDL 25 (L) 06/28/2014 0725   CHOLHDL 5.5 09/04/2015 0651   VLDL 27 09/04/2015 0651   VLDL 22 06/28/2014 0725   LDLCALC 109 (H) 09/04/2015 0651   LDLCALC 126 (H) 06/28/2014 0725    She reports good compliance with treatment. She is not having side effects.   Wt Readings from Last 3 Encounters:  11/10/16 238 lb (108 kg)  07/06/16 256 lb 12.8 oz (116.5 kg)  06/24/16 258 lb (117 kg)    ------------------------------------------------------------------------ Past Medical History:  Diagnosis Date  . Hypertension    Patient Active Problem List   Diagnosis Date Noted  . Abscess of axilla, right 09/09/2015  . Abortion, spontaneous 05/31/2015  . Edema leg 05/31/2015  . Chest discomfort 05/31/2015  . Cough 05/31/2015  . Diabetes mellitus ([place]) 05/31/2015  . Accumulation of fluid in tissues 05/31/2015  . Big thyroid 05/31/2015  . Cardiac murmur 05/31/2015  . Hot flash, menopausal 05/31/2015  .  LBP (low back pain) 05/31/2015  . Acute thoracic back pain 05/31/2015  . Multinodular goiter 05/31/2015  . Adiposity 05/31/2015  . Abnormal blood sugar 05/31/2015  . Cutaneous eruption 05/31/2015  . Avitaminosis D 05/31/2015  . Anxiety 02/15/2015  . Accessory skin tags 11/29/2014  . Esophagitis, reflux 04/27/2008  . Other  general symptoms and signs 10/05/2006  . Pure hypercholesterolemia 09/29/2006  . 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 Acute stress disorder 02/18/2006  . 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 Bone/cartilage disorder 07/29/2005  . Benign essential HTN 07/10/2005   Past Surgical History:  Procedure Laterality Date  . ABDOMINAL HYSTERECTOMY  2002  . COLONOSCOPY  2015  . KNEE SURGERY  2012  . LAPAROSCOPIC OOPHORECTOMY  2012   Family History  Problem Relation Age of Onset  . Cancer Mother     colon  . Heart failure Mother   . Heart disease Father   . Hypertension Father   . Hypertension Brother   . Heart failure Paternal Grandmother   . Dementia Paternal Grandfather   . Breast cancer Paternal Aunt    Allergies  Allergen Reactions  . Chlorpheniramine-Phenylephrine Hives  . Other Hives    actifed  . Sulfa Antibiotics   . Sulfur Other (See Comments)    Low b/p     Previous Medications   ALPRAZOLAM (XANAX) 0.5 MG TABLET    TAKE 1-2 TABLETS BY MOUTH 3 TIMES DAILY FOR ANXIETY STRESS   AMLODIPINE-VALSARTAN-HCTZ 10-160-12.5 MG TABS    daily.    ASPIRIN 81 MG TABLET    Take 81 mg by mouth daily.   CALCIUM CARBONATE-VITAMIN D 600-200 MG-UNIT TABS       CHOLECALCIFEROL (VITAMIN D-3) 1000 UNITS CAPS    Take by mouth daily.   FUROSEMIDE (LASIX) 20 MG TABLET    TAKE 1 TABLET DAILY AS     NEEDED FOR EDEMA   MELOXICAM (MOBIC) 15 MG TABLET       OMEGA-3 FATTY ACIDS (FISH OIL DOUBLE STRENGTH) 1200 MG CAPS    Take by mouth.   OMEPRAZOLE (PRILOSEC) 40 MG CAPSULE    TAKE 1 CAPSULE DAILY FOR   REFLUX ESOPHAGITIS WITH    GASTRITIS   POTASSIUM CITRATE PO    Take by mouth daily.   SERTRALINE (ZOLOFT) 50 MG TABLET    Take 1 tablet (50 mg total) by mouth daily.    Review of Systems  Constitutional: Negative.   HENT: Negative.   Respiratory: Negative.   Cardiovascular: Negative.   Gastrointestinal: Negative.   Endocrine: Negative.   Musculoskeletal:  Negative.   Psychiatric/Behavioral: Negative for dysphoric mood and suicidal ideas. The patient is nervous/anxious.        Stable on medications daily.    Social History  Substance Use Topics  . Smoking status: Never Smoker  . Smokeless tobacco: Never Used  . Alcohol use 0.0 oz/week     Comment: 2-4/week   Objective:   BP (!) 136/92 (BP Location: Right Arm, Patient Position: Sitting, Cuff Size: Normal)   Pulse 63   Temp 98.3 F (36.8 C) (Oral)   Wt 238 lb (108 kg)   SpO2 97%   BMI 35.15 kg/m   Physical Exam  Constitutional: She is oriented to person, place, and time. She appears well-developed and well-nourished. No distress.  HENT:  Head: Normocephalic and atraumatic.  Right Ear: Hearing and external ear normal.  Left Ear: Hearing and external ear normal.  Nose: Nose normal.  Mouth/Throat: Oropharynx  is clear and moist.  Eyes: Conjunctivae and lids are normal. Right eye exhibits no discharge. Left eye exhibits no discharge. No scleral icterus.  Neck: Neck supple. No thyromegaly present.  Cardiovascular: Normal rate and regular rhythm.   Pulmonary/Chest: Effort normal and breath sounds normal. No respiratory distress.  Abdominal: Soft. Bowel sounds are normal.  Musculoskeletal: Normal range of motion.  Lymphadenopathy:    She has no cervical adenopathy.  Neurological: She is alert and oriented to person, place, and time. She has normal reflexes.  Normal monofilament exam without numbness, calluses or corns today.   Skin: Skin is intact. No lesion and no rash noted.  Psychiatric: She has a normal mood and affect. Her speech is normal and behavior is normal. Thought content normal.      Assessment & Plan:     1. Type 2 diabetes mellitus without complication, without long-term current use of insulin (HCC) BS at home in the 125-150 range. Hgb A1C 6.3% today. 20 lbs weight loss since 06-24-16 has helped control with diet and exercise. No additional medications yet. Foot exam  normal without signs of peripheral neuropathy. Will get routine labs including urine microalbumin. Recheck pending reports. - POCT HgB A1C - POCT UA - Microalbumin - CBC with Differential/Platelet - Comprehensive metabolic panel - Lipid panel  2. Benign essential HTN Tolerating Amlodipine-Valsartan-HCTZ 10-160-12.4 mg tablet qd. Occasional lower extremity edema when standing all day at work and controlled with use of Lasix 20 mg qd prn. Recheck labs and follow up pending reports. - CBC with Differential/Platelet - Comprehensive metabolic panel - Lipid panel - TSH  3. Esophagitis, reflux History of LA Grade B reflux esophagitis with gastritis and negative H.pylori on EGD of 11-21-09 by Dr. Vira Agar. Dyspepsia well controlled with use of Omeprazole daily. Given precautions regarding diet and acidic foods. Recheck labs. - CBC with Differential/Platelet  4. Pure hypercholesterolemia Has lost 20 lbs since 06-24-16 and continues to follow low fat diet, exercise and Krill Oil regimen. Recheck labs and follow up pending reports. - Comprehensive metabolic panel - Lipid panel - TSH  5. Anxiety Mild nervousness. In general well controlled with using Xanax and Zoloft once a day. Will recheck labs and follow up pending reports. - CBC with Differential/Platelet - Comprehensive metabolic panel - TSH

## 2016-11-11 LAB — CBC WITH DIFFERENTIAL/PLATELET
BASOS ABS: 0 10*3/uL (ref 0.0–0.2)
Basos: 0 %
EOS (ABSOLUTE): 0.3 10*3/uL (ref 0.0–0.4)
Eos: 4 %
Hematocrit: 40.2 % (ref 34.0–46.6)
Hemoglobin: 13 g/dL (ref 11.1–15.9)
IMMATURE GRANULOCYTES: 0 %
Immature Grans (Abs): 0 10*3/uL (ref 0.0–0.1)
LYMPHS ABS: 3.7 10*3/uL — AB (ref 0.7–3.1)
Lymphs: 50 %
MCH: 25.4 pg — ABNORMAL LOW (ref 26.6–33.0)
MCHC: 32.3 g/dL (ref 31.5–35.7)
MCV: 79 fL (ref 79–97)
MONOS ABS: 0.5 10*3/uL (ref 0.1–0.9)
Monocytes: 7 %
NEUTROS PCT: 39 %
Neutrophils Absolute: 2.9 10*3/uL (ref 1.4–7.0)
PLATELETS: 284 10*3/uL (ref 150–379)
RBC: 5.11 x10E6/uL (ref 3.77–5.28)
RDW: 14.2 % (ref 12.3–15.4)
WBC: 7.5 10*3/uL (ref 3.4–10.8)

## 2016-11-11 LAB — COMPREHENSIVE METABOLIC PANEL
ALK PHOS: 80 IU/L (ref 39–117)
ALT: 21 IU/L (ref 0–32)
AST: 23 IU/L (ref 0–40)
Albumin/Globulin Ratio: 1.3 (ref 1.2–2.2)
Albumin: 4.3 g/dL (ref 3.5–5.5)
BUN/Creatinine Ratio: 13 (ref 9–23)
BUN: 13 mg/dL (ref 6–24)
Bilirubin Total: 0.5 mg/dL (ref 0.0–1.2)
CALCIUM: 10.1 mg/dL (ref 8.7–10.2)
CO2: 26 mmol/L (ref 18–29)
CREATININE: 0.97 mg/dL (ref 0.57–1.00)
Chloride: 99 mmol/L (ref 96–106)
GFR calc Af Amer: 76 mL/min/{1.73_m2} (ref >59)
GFR, EST NON AFRICAN AMERICAN: 65 mL/min/{1.73_m2} (ref >59)
Globulin, Total: 3.2 g/dL (ref 1.5–4.5)
Glucose: 117 mg/dL — ABNORMAL HIGH (ref 65–99)
POTASSIUM: 3.8 mmol/L (ref 3.5–5.2)
SODIUM: 142 mmol/L (ref 134–144)
Total Protein: 7.5 g/dL (ref 6.0–8.5)

## 2016-11-11 LAB — TSH: TSH: 2.24 u[IU]/mL (ref 0.450–4.500)

## 2016-11-11 LAB — LIPID PANEL
CHOL/HDL RATIO: 4.4 ratio (ref 0.0–4.4)
CHOLESTEROL TOTAL: 173 mg/dL (ref 100–199)
HDL: 39 mg/dL — AB (ref >39)
LDL Calculated: 101 mg/dL — ABNORMAL HIGH (ref 0–99)
Triglycerides: 164 mg/dL — ABNORMAL HIGH (ref 0–149)
VLDL Cholesterol Cal: 33 mg/dL (ref 5–40)

## 2016-11-30 ENCOUNTER — Other Ambulatory Visit: Payer: Self-pay | Admitting: Family Medicine

## 2016-11-30 DIAGNOSIS — F439 Reaction to severe stress, unspecified: Secondary | ICD-10-CM

## 2016-11-30 MED ORDER — ALPRAZOLAM 0.5 MG PO TABS: ORAL_TABLET | ORAL | 0 refills | Status: DC

## 2016-11-30 NOTE — Telephone Encounter (Signed)
[facility] would like [facility]. Okay for a 90 day supply?

## 2016-11-30 NOTE — Telephone Encounter (Signed)
[facility] a request on the following medication.  Thanks CC  ALPRAZolam (XANAX) 0.5 MG tablet Take 1 tablet at bedtime as needed for anxiety.

## 2016-11-30 NOTE — Telephone Encounter (Signed)
RX called in at [place].

## 2016-11-30 NOTE — Telephone Encounter (Signed)
Printed refill for pick up.

## 2016-11-30 NOTE — Telephone Encounter (Signed)
Phone in as written. If having more anxiety/nervousness, will need follow up appointment to adjust treatment.

## 2016-12-01 ENCOUNTER — Other Ambulatory Visit: Payer: Self-pay | Admitting: Family Medicine

## 2017-01-22 ENCOUNTER — Other Ambulatory Visit: Payer: Self-pay | Admitting: Family Medicine

## 2017-01-22 ENCOUNTER — Telehealth: Payer: Self-pay | Admitting: Family Medicine

## 2017-01-22 DIAGNOSIS — R600 Localized edema: Secondary | ICD-10-CM

## 2017-01-22 DIAGNOSIS — F439 Reaction to severe stress, unspecified: Secondary | ICD-10-CM

## 2017-01-22 MED ORDER — ALPRAZOLAM 0.5 MG PO TABS: ORAL_TABLET | ORAL | 0 refills | Status: DC

## 2017-01-22 NOTE — Telephone Encounter (Signed)
Printed prescription refill to pick up at the front desk.

## 2017-01-22 NOTE — Telephone Encounter (Signed)
[facility] a request for the following medication. Thanks CC  ALPRAZolam (XANAX) 0.5 MG tablet  Qty:30

## 2017-01-25 NOTE — Telephone Encounter (Signed)
RX faxed to [place] on 01/22/2017

## 2017-02-16 ENCOUNTER — Telehealth: Payer: Self-pay | Admitting: Family Medicine

## 2017-02-16 ENCOUNTER — Other Ambulatory Visit: Payer: Self-pay | Admitting: Family Medicine

## 2017-02-16 DIAGNOSIS — F439 Reaction to severe stress, unspecified: Secondary | ICD-10-CM

## 2017-02-16 MED ORDER — ALPRAZOLAM 0.5 MG PO TABS: ORAL_TABLET | ORAL | 0 refills | Status: DC

## 2017-02-16 NOTE — Telephone Encounter (Signed)
[facility] a request on the following medication. Thanks CC  ALPRAZolam (XANAX) 0.5 MG tablet  >Take 1-2 tablets 3 times a day as needed for stress or anxiety.

## 2017-02-16 NOTE — Telephone Encounter (Signed)
RX called in at [facility]  

## 2017-02-16 NOTE — Telephone Encounter (Signed)
Phone in refill as authorized in medication list.

## 2017-03-19 ENCOUNTER — Ambulatory Visit (INDEPENDENT_AMBULATORY_CARE_PROVIDER_SITE_OTHER): Payer: BC Managed Care – PPO | Admitting: Family Medicine

## 2017-03-19 ENCOUNTER — Encounter: Payer: Self-pay | Admitting: Family Medicine

## 2017-03-19 VITALS — BP 128/100 | HR 69 | Temp 98.3°F | Resp 16 | Wt 233.2 lb

## 2017-03-19 DIAGNOSIS — R109 Unspecified abdominal pain: Secondary | ICD-10-CM

## 2017-03-19 DIAGNOSIS — M549 Dorsalgia, unspecified: Secondary | ICD-10-CM | POA: Diagnosis not present

## 2017-03-19 LAB — POCT URINALYSIS DIPSTICK
Bilirubin, UA: NEGATIVE
GLUCOSE UA: NEGATIVE
Ketones, UA: NEGATIVE
LEUKOCYTES UA: NEGATIVE
Nitrite, UA: NEGATIVE
Protein, UA: NEGATIVE
RBC UA: NEGATIVE
Spec Grav, UA: 1.01 (ref 1.010–1.025)
UROBILINOGEN UA: 0.2 U/dL
pH, UA: 5 (ref 5.0–8.0)

## 2017-03-19 IMAGING — MR MR KNEE*R* WO/W CM
8 series · 39 of 40 positions shown · IV contrast (20 ML MULTIHANCE)
Comparison: Plain films of the right knee 10/03/2015.

CLINICAL DATA: The patient reports her right knee gave way
10/02/2015 while standing. Lateral pain and swelling. Initial
encounter.

EXAM:
MRI OF THE RIGHT KNEE WITHOUT AND WITH CONTRAST
TECHNIQUE: Multiplanar, multisequence MR imaging of the knee was performed
before and after the administration of intravenous contrast.
CONTRAST:  20 mL MULTIHANCE GADOBENATE DIMEGLUMINE 529 MG/ML IV SOLN

[Series 3: PD fat-sat · axial · 3.0mm · 0.29mm/px · z∈[-70,+42]mm · 5 of 35 slices shown (1 of 3)]
[im 1/35]
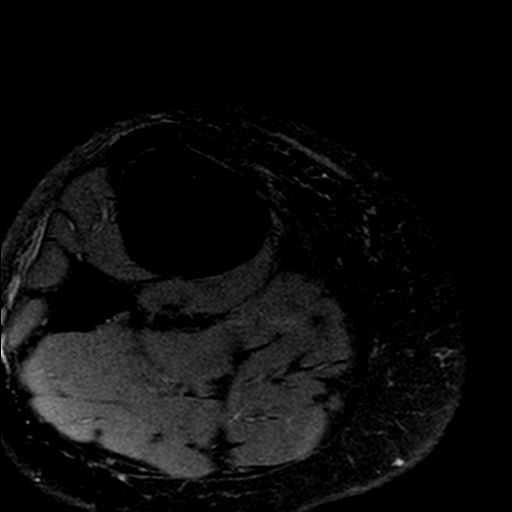
[im 9/35]
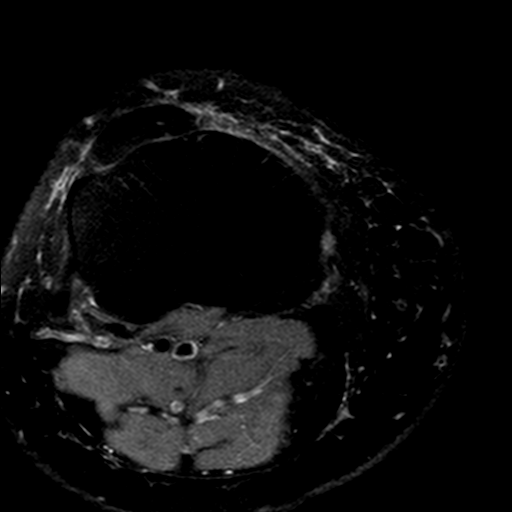
[im 18/35]
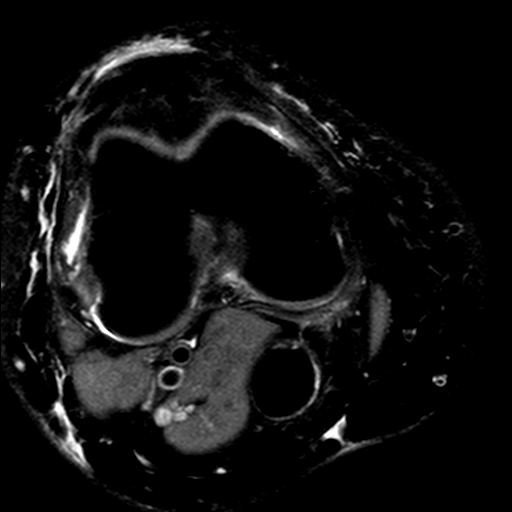
[im 26/35]
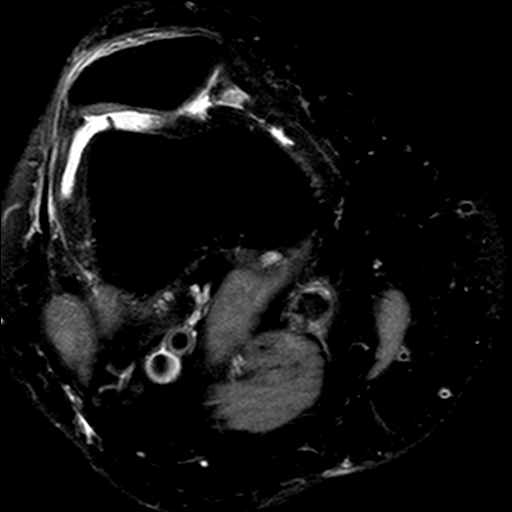
[im 35/35]
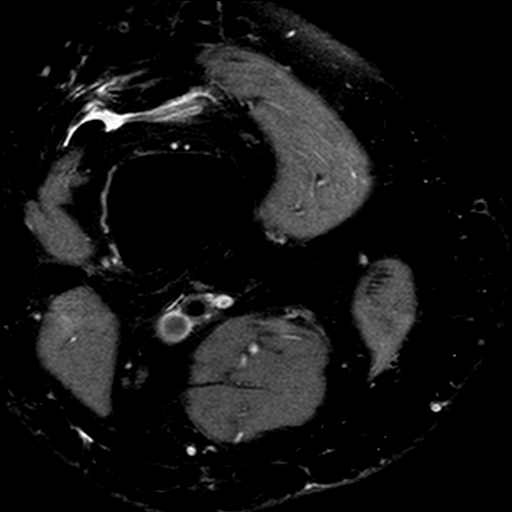

[Series 4: T2 fat-sat · coronal · 3.0mm · 0.62mm/px · 5 of 40 slices shown]
[im 1/40]
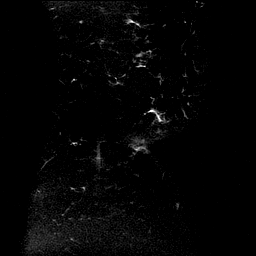
[im 10/40]
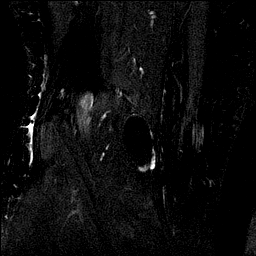
[im 20/40]
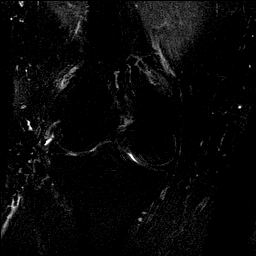
[im 30/40]
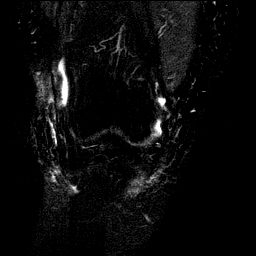
[im 40/40]
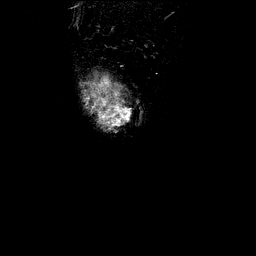

[Series 5: PD fat-sat · coronal · 3.0mm · 0.62mm/px · 5 of 41 slices shown (2 of 3)]
[im 1/41]
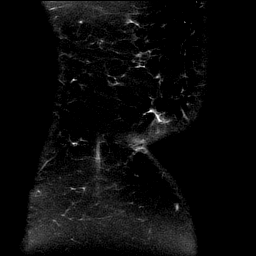
[im 11/41]
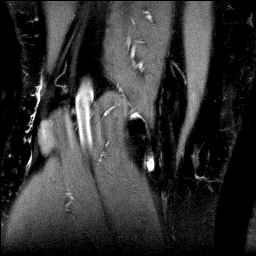
[im 21/41]
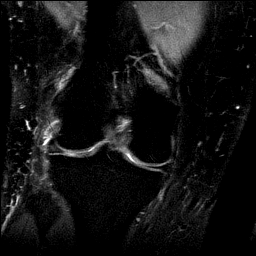
[im 31/41]
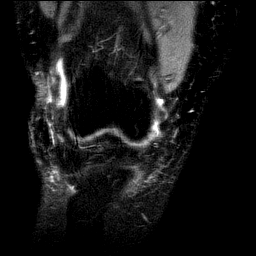
[im 41/41]
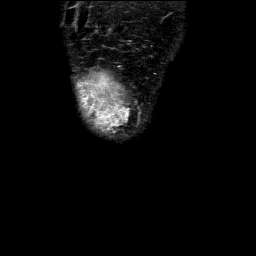

[Series 6: T1 · coronal · 3.0mm · 0.62mm/px · 5 of 41 slices shown]
[im 1/41]
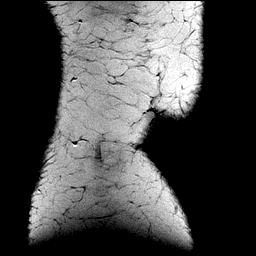
[im 11/41]
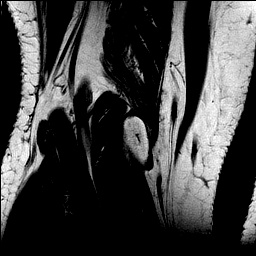
[im 21/41]
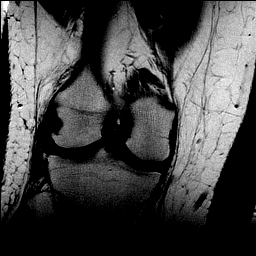
[im 31/41]
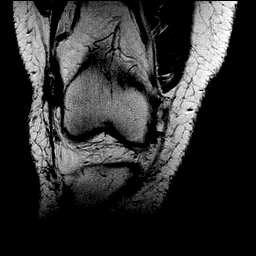
[im 41/41]
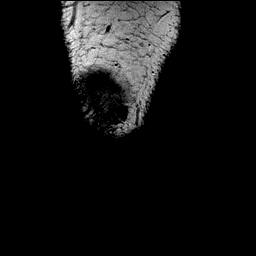

[Series 7: PD fat-sat · sagittal · 3.0mm · 0.62mm/px · 5 of 37 slices shown (3 of 3)]
[im 1/37]
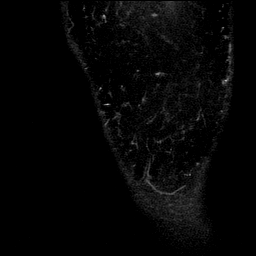
[im 10/37]
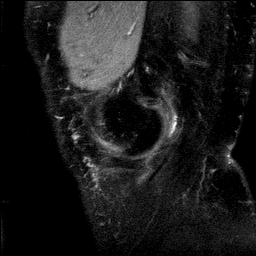
[im 19/37]
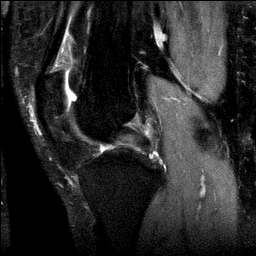
[im 28/37]
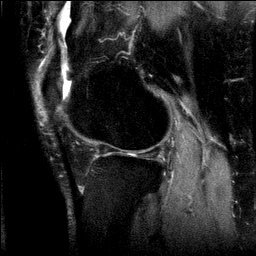
[im 37/37]
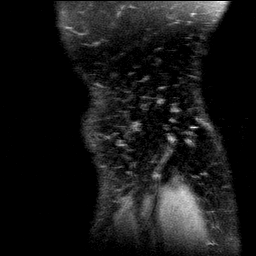

[Series 9: T1 fat-sat · axial · 3.0mm · 0.62mm/px · z∈[-86,+22]mm · 5 of 34 slices shown]
[im 1/34]
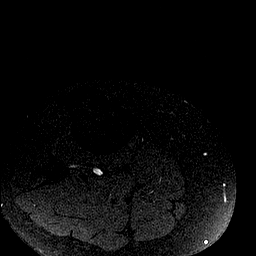
[im 9/34]
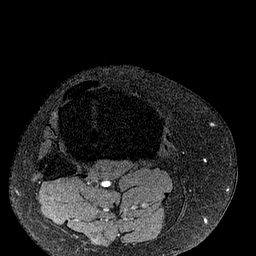
[im 17/34]
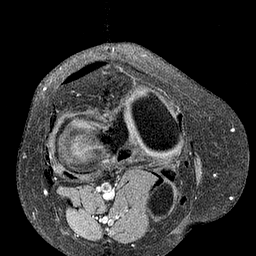
[im 25/34]
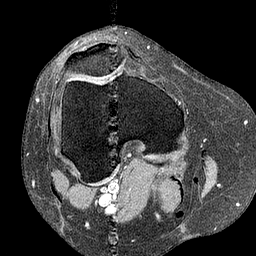
[im 34/34]
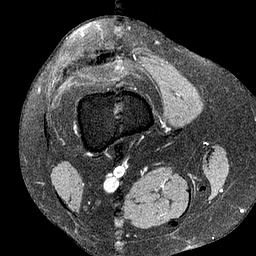

[Series 10: T1 fat-sat post-contrast · sagittal · 3.0mm · 0.31mm/px · 5 of 37 slices shown (1 of 2)]
[im 1/37]
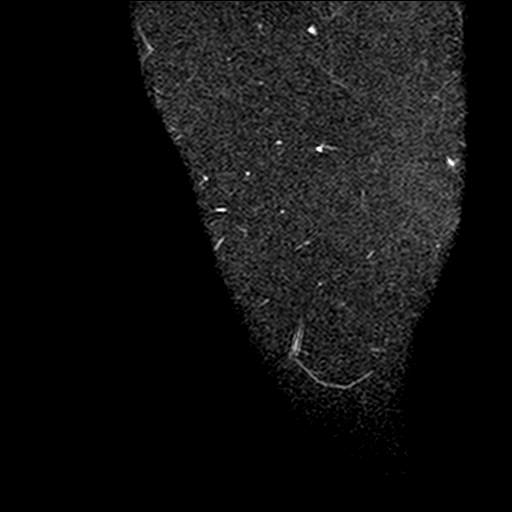
[im 10/37]
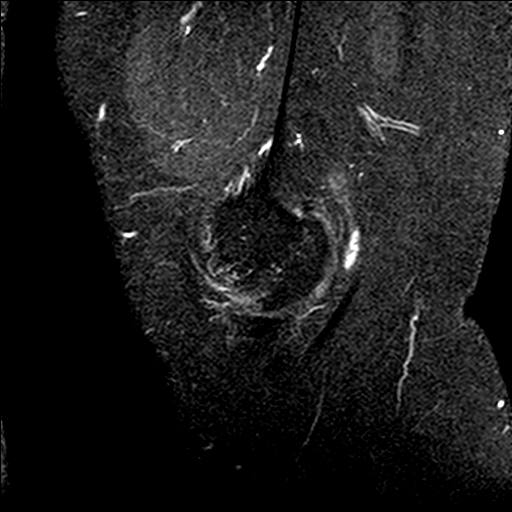
[im 19/37]
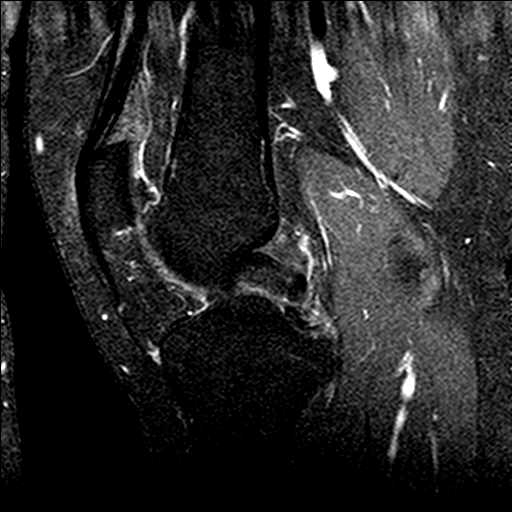
[im 28/37]
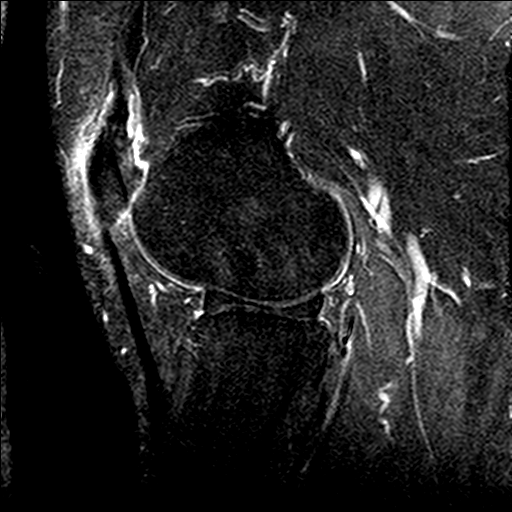
[im 37/37]
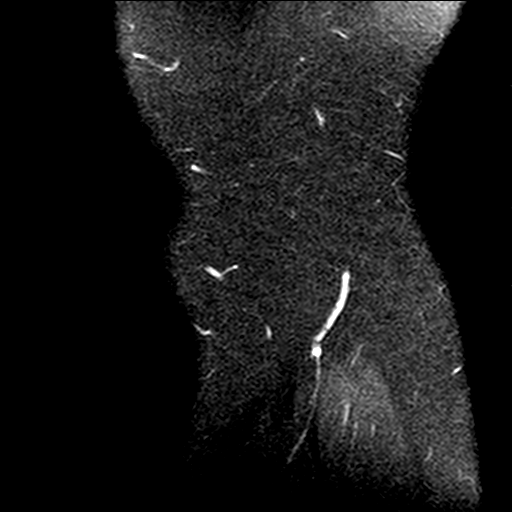

[Series 11: T1 fat-sat post-contrast · axial · 3.0mm · 0.62mm/px · z∈[-86,-7]mm · 4 of 34 slices shown (2 of 2)]
[im 1/34]
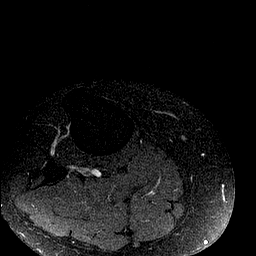
[im 9/34]
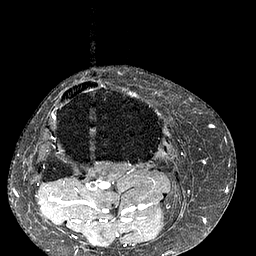
[im 17/34]
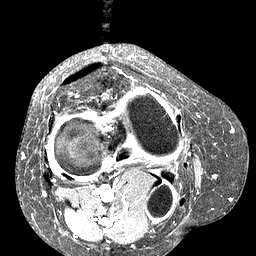
[im 25/34]
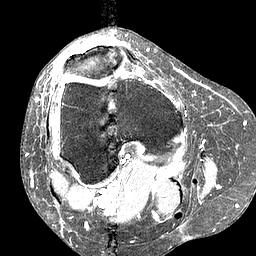

[39 of 40 positions shown; findings below may reference images not displayed]

FINDINGS: MENISCI

Medial meniscus:  Intact.

Lateral meniscus: Degenerative signal is seen and there is fraying
along the free edge of the posterior horn.

LIGAMENTS

Cruciates:  Intact.

Collaterals:  Intact.

CARTILAGE

Patellofemoral: Cartilage loss is seen about the patella and most
notable at the apex in the midpole.

Medial:  Unremarkable.

Lateral:  Unremarkable.

Joint:  Small joint effusion.

Popliteal Fossa: The patient has a Baker's cyst which contains only
a small amount of fluid. There are 2 loose bodies in the cyst. The
larger measures 2.0 cm AP x 2.0 cm transverse x 3.7 cm craniocaudal.
A second more superior loose body measures 1 cm in diameter. These
account for the calcific densities seen on the prior plain films.

Extensor Mechanism:  Intact.

Bones: Osteophytosis is seen about the knee. No fracture or
worrisome marrow lesion.
IMPRESSION: No evidence of posttraumatic change include patellar dislocation.

Fraying along the free edge of the posterior horn of the lateral
meniscus without tear identified. Negative for ligament tear.

Loose bodies in a Baker's cyst account for calcifications seen on
prior plain films.

Osteoarthritis appearing most notable in the patellofemoral and
lateral compartments.

## 2017-03-19 MED ORDER — CYCLOBENZAPRINE HCL 5 MG PO TABS: 5.0000 mg | ORAL_TABLET | Freq: Three times a day (TID) | ORAL | 0 refills | Status: DC | PRN

## 2017-03-19 MED ORDER — HYDROCODONE-ACETAMINOPHEN 5-325 MG PO TABS
1.0000 | ORAL_TABLET | Freq: Four times a day (QID) | ORAL | 0 refills | Status: DC | PRN
Start: 2017-03-19 — End: 2018-08-23

## 2017-03-19 NOTE — Patient Instructions (Signed)
Monitor for a rash. Use warm compresses for 20 minutes to help with spasms. Usually takes up to two weeks for back strains to get better.

## 2017-03-19 NOTE — Progress Notes (Signed)
   Subjective:    Patient ID: Miranda Moore, female    DOB: Jun 04, 1961, 56 y.o.   MRN: [id number]  HPI  Chief Complaint  Patient presents with  . Back Pain    Patient comes in office today with complaints of back pain on the left side radiating to abdomen for the past 3 days.Patient describes pains as sharp and states that is is painful to lay down and when inhaling. Patient denies any strenuous activity or injury, she has been taking prescription vicodin for pain.   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 Increased pain with "grabbing" when she changes position. No dysuria, change in bowel pattern, hx of kidney stones, or back surgery.     Review of Systems     Objective:   Physical Exam  Constitutional: She appears well-developed and well-nourished. Distressed: moderate discomfort when changing positions.  Pulmonary/Chest: Breath sounds normal.  Musculoskeletal:  Muscle strength in lower extremities 5/5. SLR's to 80 degrees without radiation of back pain. Localizes from her lower thoracic back to midline lower rib cage. Mild pain on palpation. No rash noted.          Assessment & Plan:  1. Mid back pain on left side: suspect strain; DDx includes early shingles. Add warm compresses, avoid nsaid's due to elevated bp, work excuse for 8/11-8/14/18, monitor for rash. - HYDROcodone-acetaminophen (NORCO/VICODIN) 5-325 MG tablet; Take 1 tablet by mouth every 6 (six) hours as needed for moderate pain.  Dispense: 20 tablet; Refill: 0 - cyclobenzaprine (FLEXERIL) 5 MG tablet; Take 1 tablet (5 mg total) by mouth 3 (three) times daily as needed for muscle spasms.  Dispense: 21 tablet; Refill: 0  2. Acute left flank pain - POCT urinalysis dipstick

## 2017-03-23 ENCOUNTER — Other Ambulatory Visit: Payer: Self-pay | Admitting: Family Medicine

## 2017-03-23 ENCOUNTER — Encounter: Payer: Self-pay | Admitting: Family Medicine

## 2017-03-23 DIAGNOSIS — F32A Depression, unspecified: Secondary | ICD-10-CM

## 2017-03-23 DIAGNOSIS — F439 Reaction to severe stress, unspecified: Secondary | ICD-10-CM

## 2017-03-23 DIAGNOSIS — F329 Major depressive disorder, single episode, unspecified: Secondary | ICD-10-CM

## 2017-03-24 ENCOUNTER — Telehealth: Payer: Self-pay | Admitting: Family Medicine

## 2017-03-24 NOTE — Telephone Encounter (Signed)
FMLA to be completed today.

## 2017-03-25 ENCOUNTER — Other Ambulatory Visit: Payer: Self-pay

## 2017-03-25 DIAGNOSIS — F439 Reaction to severe stress, unspecified: Secondary | ICD-10-CM

## 2017-03-25 MED ORDER — ALPRAZOLAM 0.5 MG PO TABS: ORAL_TABLET | ORAL | 0 refills | Status: DC

## 2017-03-25 NOTE — Telephone Encounter (Signed)
[facility] 

## 2017-03-25 NOTE — Telephone Encounter (Signed)
Please review. She is a Recruitment consultant patient. Thanks!

## 2017-05-24 ENCOUNTER — Other Ambulatory Visit: Payer: Self-pay | Admitting: Family Medicine

## 2017-05-24 DIAGNOSIS — F439 Reaction to severe stress, unspecified: Secondary | ICD-10-CM

## 2017-05-24 MED ORDER — ALPRAZOLAM 0.5 MG PO TABS: ORAL_TABLET | ORAL | 0 refills | Status: DC

## 2017-05-24 NOTE — Telephone Encounter (Signed)
Pt contacted office for refill request on the following medications:  ALPRAZolam (XANAX) 0.5 MG tablet.  Take 1-2 tablets 3 times a day as needed got stress and anxiety.   [facility]

## 2017-07-21 ENCOUNTER — Other Ambulatory Visit: Payer: Self-pay | Admitting: Family Medicine

## 2017-07-21 ENCOUNTER — Telehealth: Payer: Self-pay | Admitting: Family Medicine

## 2017-07-21 DIAGNOSIS — F439 Reaction to severe stress, unspecified: Secondary | ICD-10-CM

## 2017-07-21 NOTE — Telephone Encounter (Signed)
[facility] ref[facility] request for following medications:  ALPRAZolam (XANAX) 0.5 MG tablet    Please advise,Thanks [place]

## 2017-07-22 MED ORDER — ALPRAZOLAM 0.5 MG PO TABS: ORAL_TABLET | ORAL | 0 refills | Status: DC

## 2017-07-22 NOTE — Telephone Encounter (Signed)
RX called in at [place]

## 2017-07-22 NOTE — Telephone Encounter (Signed)
Phone in refill of Alprazolam and remind patient she is due for follow up appointment with labs in January.

## 2017-08-18 ENCOUNTER — Other Ambulatory Visit: Payer: Self-pay | Admitting: Family Medicine

## 2017-08-18 DIAGNOSIS — F439 Reaction to severe stress, unspecified: Secondary | ICD-10-CM

## 2017-08-18 DIAGNOSIS — F329 Major depressive disorder, single episode, unspecified: Secondary | ICD-10-CM

## 2017-08-18 DIAGNOSIS — F32A Depression, unspecified: Secondary | ICD-10-CM

## 2017-08-18 NOTE — Telephone Encounter (Signed)
[facility] a refill request on the following medication. Thanks CC  ALPRAZolam (XANAX) 0.5 MG tablet

## 2017-08-18 NOTE — Telephone Encounter (Signed)
See refill request.

## 2017-08-19 MED ORDER — ALPRAZOLAM 0.5 MG PO TABS: ORAL_TABLET | ORAL | 0 refills | Status: DC

## 2017-08-19 NOTE — Telephone Encounter (Signed)
RX faxed to [place] with a note to pharmacist to advise patient to schedule follow up appt within the next 30 days. Also left patient a message with the same information stated above.

## 2017-08-19 NOTE — Telephone Encounter (Signed)
May phone in refill. Patient must schedule follow up appointment in the next 30 days.

## 2017-08-19 NOTE — Telephone Encounter (Signed)
[facility] another refill request for ALPRAZolam (XANAX) 0.5 MG tablet. Please advise. Thanks TNP

## 2017-09-02 ENCOUNTER — Other Ambulatory Visit: Payer: Self-pay | Admitting: Family Medicine

## 2017-09-18 ENCOUNTER — Other Ambulatory Visit: Payer: Self-pay | Admitting: Family Medicine

## 2017-09-18 DIAGNOSIS — R600 Localized edema: Secondary | ICD-10-CM

## 2017-10-07 ENCOUNTER — Other Ambulatory Visit: Payer: Self-pay | Admitting: Family Medicine

## 2017-10-07 ENCOUNTER — Telehealth: Payer: Self-pay | Admitting: Family Medicine

## 2017-10-07 DIAGNOSIS — Z1231 Encounter for screening mammogram for malignant neoplasm of breast: Secondary | ICD-10-CM

## 2017-10-07 DIAGNOSIS — F439 Reaction to severe stress, unspecified: Secondary | ICD-10-CM

## 2017-10-07 MED ORDER — ALPRAZOLAM 0.5 MG PO TABS: ORAL_TABLET | ORAL | 0 refills | Status: DC

## 2017-10-07 NOTE — Telephone Encounter (Signed)
Printed for pick up and keep appointment on 10-21-17 as scheduled.

## 2017-10-07 NOTE — Telephone Encounter (Signed)
Patient is requesting refill on her ALPRAZolam Duanne Moron) 0.5 MG tablet    Patient does has a appt set up to come in   [facility]

## 2017-10-07 NOTE — Telephone Encounter (Signed)
[facility]

## 2017-10-15 ENCOUNTER — Ambulatory Visit
Admission: RE | Admit: 2017-10-15 | Discharge: 2017-10-15 | Disposition: A | Discharge status: Home / Self Care | Payer: BC Managed Care – PPO | Source: Ambulatory Visit | Attending: Family Medicine | Admitting: Family Medicine

## 2017-10-15 DIAGNOSIS — Z1231 Encounter for screening mammogram for malignant neoplasm of breast: Secondary | ICD-10-CM | POA: Insufficient documentation

## 2017-10-21 ENCOUNTER — Ambulatory Visit (INDEPENDENT_AMBULATORY_CARE_PROVIDER_SITE_OTHER): Payer: BC Managed Care – PPO | Admitting: Physician Assistant

## 2017-10-21 ENCOUNTER — Encounter: Payer: Self-pay | Admitting: Physician Assistant

## 2017-10-21 VITALS — BP 118/70 | HR 73 | Temp 97.8°F | Resp 16 | Ht 69.0 in | Wt 232.2 lb

## 2017-10-21 DIAGNOSIS — Z6834 Body mass index (BMI) 34.0-34.9, adult: Secondary | ICD-10-CM

## 2017-10-21 DIAGNOSIS — E6609 Other obesity due to excess calories: Secondary | ICD-10-CM

## 2017-10-21 DIAGNOSIS — E119 Type 2 diabetes mellitus without complications: Secondary | ICD-10-CM

## 2017-10-21 DIAGNOSIS — E010 Iodine-deficiency related diffuse (endemic) goiter: Secondary | ICD-10-CM

## 2017-10-21 DIAGNOSIS — R0683 Snoring: Secondary | ICD-10-CM

## 2017-10-21 DIAGNOSIS — Z0000 Encounter for general adult medical examination without abnormal findings: Secondary | ICD-10-CM | POA: Diagnosis not present

## 2017-10-21 DIAGNOSIS — F439 Reaction to severe stress, unspecified: Secondary | ICD-10-CM

## 2017-10-21 DIAGNOSIS — E7800 Pure hypercholesterolemia, unspecified: Secondary | ICD-10-CM | POA: Diagnosis not present

## 2017-10-21 DIAGNOSIS — I10 Essential (primary) hypertension: Secondary | ICD-10-CM

## 2017-10-21 MED ORDER — ALPRAZOLAM 0.5 MG PO TABS
ORAL_TABLET | ORAL | 0 refills | Status: DC
Start: 2017-10-21 — End: 2017-10-21

## 2017-10-21 MED ORDER — ALPRAZOLAM 0.5 MG PO TABS: ORAL_TABLET | ORAL | 1 refills | Status: DC

## 2017-10-21 NOTE — Progress Notes (Signed)
Patient: Miranda Moore, Female    DOB: 1960/09/27, 57 y.o.   MRN: [id number] Visit Date: 10/21/2017  Today's Provider: Mar Daring, PA-C   Chief Complaint  Patient presents with  . Annual Exam   Subjective:    Annual physical exam [name] is a 57 y.o. female who presents today for health maintenance and complete physical. She feels well. She reports exercising. She reports she is sleeping fairly well.  Last CPE:09/06/15 Mammogram:10/15/17 BI-RADS 1 Pap:01/08/11 Normal-Hysterectomy ----------------------------------------------------------------- Per patient she received a Td on 10/11/17 due to a bite. Patient is a Marine scientist. She also had labs done last week  With HIV and Hep C screening. Per patient everything came back Negative. Flu Vaccine given at the health care at the hospital on 05/2018.  She also has new complaint of snoring. Reports she went on vacation with her daughter and she told her that she snores really loud. She does admit to awakening with a daily headache, has had occasional nighttime awakenings gasping for breath and daytime somnolence. Epworth score is 11 today in the office.   Review of Systems  Constitutional: Positive for appetite change.  HENT: Negative.   Eyes: Negative.   Respiratory: Negative.   Cardiovascular: Negative.   Gastrointestinal: Negative.   Endocrine: Negative.   Genitourinary: Negative.   Musculoskeletal: Negative.   Skin: Negative.   Allergic/Immunologic: Negative.   Neurological: Negative.   Hematological: Negative.   Psychiatric/Behavioral: Negative.     Social History      She  reports that  has never smoked. she has never used smokeless tobacco. She reports that she drinks alcohol. She reports that she does not use drugs.       Social History   Socioeconomic History  . Marital status: Married    Spouse name: None  . Number of children: None  . Years of education: None  . Highest education level: None    Social Needs  . Financial resource strain: None  . Food insecurity - worry: None  . Food insecurity - inability: None  . Transportation needs - medical: None  . Transportation needs - non-medical: None  Occupational History  . None  Tobacco Use  . Smoking status: Never Smoker  . Smokeless tobacco: Never Used  Substance and Sexual Activity  . Alcohol use: Yes    Alcohol/week: 0.0 oz    Comment: 2-4/week  . Drug use: No  . Sexual activity: None  Other Topics Concern  . None  Social History Narrative  . None    Past Medical History:  Diagnosis Date  . Hypertension      Patient Active Problem List   Diagnosis Date Noted  . Abscess of axilla, right 09/09/2015  . Abortion, spontaneous 05/31/2015  . Edema leg 05/31/2015  . Chest discomfort 05/31/2015  . Cough 05/31/2015  . Diabetes mellitus ([place]) 05/31/2015  . Accumulation of fluid in tissues 05/31/2015  . Big thyroid 05/31/2015  . Cardiac murmur 05/31/2015  . Hot flash, menopausal 05/31/2015  . LBP (low back pain) 05/31/2015  . Acute thoracic back pain 05/31/2015  . Multinodular goiter 05/31/2015  . Adiposity 05/31/2015  . Abnormal blood sugar 05/31/2015  . Cutaneous eruption 05/31/2015  . Avitaminosis D 05/31/2015  . Anxiety 02/15/2015  . Accessory skin tags 11/29/2014  . Esophagitis, reflux 04/27/2008  . Other general symptoms and signs 10/05/2006  . Pure hypercholesterolemia 09/29/2006  . 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 Acute  stress disorder 02/18/2006  . 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 Bone/cartilage disorder 07/29/2005  . Benign essential HTN 07/10/2005    Past Surgical History:  Procedure Laterality Date  . ABDOMINAL HYSTERECTOMY  2002  . APPENDECTOMY  2013   laparascopic with removal of tubal remnant  . COLONOSCOPY  2015  . KNEE SURGERY  2012  . LAPAROSCOPIC OOPHORECTOMY  2012    Family History        Family Status  Relation Name Status  .  Mother  Deceased  . Father  Alive  . Brother  Alive  . PGM  Deceased  . PGF  Deceased  . MGM  Deceased  . MGF  Deceased  . Sister  Alive  . Ethlyn Daniels  (Not Specified)        Her family history includes Breast cancer in her paternal aunt; Cancer in her mother; Dementia in her paternal grandfather; Heart disease in her father; Heart failure in her mother and paternal grandmother; Hypertension in her brother and father.      Allergies  Allergen Reactions  . Chlorpheniramine-Phenylephrine Hives  . Other Hives    actifed  . Sulfa Antibiotics   . Sulfur Other (See Comments)    Low b/p     Current Outpatient Medications:  .  ALPRAZolam (XANAX) 0.5 MG tablet, TAKE 1-2 TABLETS BY MOUTH 3 TIMES DAILY FOR ANXIETY STRESS, Disp: 30 tablet, Rfl: 0 .  Amlodipine-Valsartan-HCTZ 10-160-12.5 MG TABS, TAKE 1 TABLET DAILY FOR    HYPERTENSION, Disp: 90 tablet, Rfl: 3 .  aspirin 81 MG tablet, Take 81 mg by mouth daily., Disp: , Rfl:  .  Calcium Carbonate-Vitamin D 600-200 MG-UNIT TABS, , Disp: , Rfl:  .  Cholecalciferol (VITAMIN D-3) 1000 UNITS CAPS, Take by mouth daily., Disp: , Rfl:  .  cyclobenzaprine (FLEXERIL) 5 MG tablet, Take 1 tablet (5 mg total) by mouth 3 (three) times daily as needed for muscle spasms., Disp: 21 tablet, Rfl: 0 .  furosemide (LASIX) 20 MG tablet, TAKE 1 TABLET DAILY AS     NEEDED FOR EDEMA, Disp: 90 tablet, Rfl: 1 .  HYDROcodone-acetaminophen (NORCO/VICODIN) 5-325 MG tablet, Take 1 tablet by mouth every 6 (six) hours as needed for moderate pain., Disp: 20 tablet, Rfl: 0 .  meloxicam (MOBIC) 15 MG tablet, , Disp: , Rfl:  .  Omega-3 Fatty Acids (FISH OIL DOUBLE STRENGTH) 1200 MG CAPS, Take by mouth., Disp: , Rfl:  .  omeprazole (PRILOSEC) 40 MG capsule, TAKE 1 CAPSULE DAILY FOR   REFLUX ESOPHAGITIS WITH    GASTRITIS, Disp: 90 capsule, Rfl: 3 .  POTASSIUM CITRATE PO, Take by mouth daily., Disp: , Rfl:  .  sertraline (ZOLOFT) 50 MG tablet, TAKE 1 TABLET DAILY, Disp: 90 tablet,  Rfl: 0   Patient Care Team: Chrismon, Vickki Muff, PA as PCP - General (Family Medicine) Christene Lye, MD (General Surgery) Bary Castilla Forest Gleason, MD (General Surgery)      Objective:   Vitals: BP 118/70 (BP Location: Left Arm, Patient Position: Sitting, Cuff Size: Normal)   Pulse 73   Temp 97.8 F (36.6 C) (Oral)   Resp 16   Ht 5\' 9"  (1.753 m)   Wt 232 lb 3.2 oz (105.3 kg)   BMI 34.29 kg/m     Physical Exam  Constitutional: She is oriented to person, place, and time. She appears well-developed and well-nourished. No distress.  HENT:  Head: Normocephalic and atraumatic.  Right Ear: Hearing, tympanic membrane, external ear and  ear canal normal.  Left Ear: Hearing, tympanic membrane, external ear and ear canal normal.  Nose: Nose normal.  Mouth/Throat: Uvula is midline, oropharynx is clear and moist and mucous membranes are normal. No oropharyngeal exudate.  Eyes: Conjunctivae and EOM are normal. Pupils are equal, round, and reactive to light. Right eye exhibits no discharge. Left eye exhibits no discharge. No scleral icterus.  Neck: Normal range of motion. Neck supple. No JVD present. No tracheal deviation present. No thyromegaly present.  Cardiovascular: Normal rate, regular rhythm, normal heart sounds and intact distal pulses. Exam reveals no gallop and no friction rub.  No murmur heard. Pulmonary/Chest: Effort normal and breath sounds normal. No respiratory distress. She has no wheezes. She has no rales. She exhibits no tenderness.  Abdominal: Soft. Bowel sounds are normal. She exhibits no distension and no mass. There is no tenderness. There is no rebound and no guarding.  Musculoskeletal: Normal range of motion. She exhibits no edema or tenderness.  Lymphadenopathy:    She has no cervical adenopathy.  Neurological: She is alert and oriented to person, place, and time. She has normal reflexes.  Skin: Skin is warm and dry. No rash noted. She is not diaphoretic.    Psychiatric: She has a normal mood and affect. Her behavior is normal. Judgment and thought content normal.  Vitals reviewed.    Depression Screen PHQ 2/9 Scores 10/21/2017 11/10/2016 09/06/2015  PHQ - 2 Score 3 2 0  PHQ- 9 Score 5 8 -      Assessment & Plan:     Routine Health Maintenance and Physical Exam  Exercise Activities and Dietary recommendations Goals    None      Immunization History  Administered Date(s) Administered  . Influenza-Unspecified 05/11/2015  . Tdap 05/28/2011    Health Maintenance  Topic Date Due  . Hepatitis C Screening  08/15/1960  . PNEUMOCOCCAL POLYSACCHARIDE VACCINE (1) 11/09/1962  . OPHTHALMOLOGY EXAM  11/09/1970  . HIV Screening  11/09/1975  . PAP SMEAR  01/07/2014  . INFLUENZA VACCINE  03/10/2017  . HEMOGLOBIN A1C  05/12/2017  . FOOT EXAM  11/10/2017  . MAMMOGRAM  10/16/2019  . COLONOSCOPY  11/22/2019  . TETANUS/TDAP  05/27/2021     Discussed health benefits of physical activity, and encouraged her to engage in regular exercise appropriate for her age and condition.    1. Annual physical exam Normal physical exam today. Will check labs as below and f/u pending lab results. If labs are stable and WNL she will not need to have these rechecked for one year at her next annual physical exam. She is to call the office in the meantime if she has any acute issue, questions or concerns. - Comprehensive metabolic panel - CBC with Differential/Platelet - Hemoglobin A1c - Lipid panel - TSH  2. Benign essential HTN Stable. Continue Amlodipine-valsartan-hctz 10-160-12.5mg . Will check labs as below and f/u pending results. - Comprehensive metabolic panel - CBC with Differential/Platelet - Hemoglobin A1c - Lipid panel - TSH - Ambulatory referral to Sleep Studies  3. Big thyroid Stable. Will check labs as below and f/u pending results. - Comprehensive metabolic panel - CBC with Differential/Platelet - TSH  4. Pure  hypercholesterolemia Stable. Diet controlled. Will check labs as below and f/u pending results. - Comprehensive metabolic panel - CBC with Differential/Platelet - Hemoglobin A1c - Lipid panel - TSH  5. Type 2 diabetes mellitus without complication, without long-term current use of insulin (HCC) Stable. Diet controlled. Will check labs as below  and f/u pending results. - Hemoglobin A1c  6. Snoring Worsening. Epworth score of 11. Daily morning headache. Will refer as below for sleep study. - Ambulatory referral to Sleep Studies  7. Class 1 obesity due to excess calories with serious comorbidity and body mass index (BMI) of 34.0 to 34.9 in adult Counseled patient on healthy lifestyle modifications including dieting and exercise.  - Ambulatory referral to Sleep Studies  8. Situational stress Stable. Diagnosis pulled for medication refill. Continue current medical treatment plan. - ALPRAZolam (XANAX) 0.5 MG tablet; TAKE 1-2 TABLETS BY MOUTH 3 TIMES DAILY PRN FOR ANXIETY STRESS  Dispense: 90 tablet; Refill: 1  --------------------------------------------------------------------    Mar Daring, PA-C  [facility]

## 2017-10-21 NOTE — Patient Instructions (Signed)
Health Maintenance for Postmenopausal Women Menopause is a normal process in which your reproductive ability comes to an end. This process happens gradually over a span of months to years, usually between the ages of 22 and 9. Menopause is complete when you have missed 12 consecutive menstrual periods. It is important to talk with your health care provider about some of the most common conditions that affect postmenopausal women, such as heart disease, cancer, and bone loss (osteoporosis). Adopting a healthy lifestyle and getting preventive care can help to promote your health and wellness. Those actions can also lower your chances of developing some of these common conditions. What should I know about menopause? During menopause, you may experience a number of symptoms, such as:  Moderate-to-severe hot flashes.  Night sweats.  Decrease in sex drive.  Mood swings.  Headaches.  Tiredness.  Irritability.  Memory problems.  Insomnia.  Choosing to treat or not to treat menopausal changes is an individual decision that you make with your health care provider. What should I know about hormone replacement therapy and supplements? Hormone therapy products are effective for treating symptoms that are associated with menopause, such as hot flashes and night sweats. Hormone replacement carries certain risks, especially as you become older. If you are thinking about using estrogen or estrogen with progestin treatments, discuss the benefits and risks with your health care provider. What should I know about heart disease and stroke? Heart disease, heart attack, and stroke become more likely as you age. This may be due, in part, to the hormonal changes that your body experiences during menopause. These can affect how your body processes dietary fats, triglycerides, and cholesterol. Heart attack and stroke are both medical emergencies. There are many things that you can do to help prevent heart disease  and stroke:  Have your blood pressure checked at least every 1-2 years. High blood pressure causes heart disease and increases the risk of stroke.  If you are 53-22 years old, ask your health care provider if you should take aspirin to prevent a heart attack or a stroke.  Do not use any tobacco products, including cigarettes, chewing tobacco, or electronic cigarettes. If you need help quitting, ask your health care provider.  It is important to eat a healthy diet and maintain a healthy weight. ? Be sure to include plenty of vegetables, fruits, low-fat dairy products, and lean protein. ? Avoid eating foods that are high in solid fats, added sugars, or salt (sodium).  Get regular exercise. This is one of the most important things that you can do for your health. ? Try to exercise for at least 150 minutes each week. The type of exercise that you do should increase your heart rate and make you sweat. This is known as moderate-intensity exercise. ? Try to do strengthening exercises at least twice each week. Do these in addition to the moderate-intensity exercise.  Know your numbers.Ask your health care provider to check your cholesterol and your blood glucose. Continue to have your blood tested as directed by your health care provider.  What should I know about cancer screening? There are several types of cancer. Take the following steps to reduce your risk and to catch any cancer development as early as possible. Breast Cancer  Practice breast self-awareness. ? This means understanding how your breasts normally appear and feel. ? It also means doing regular breast self-exams. Let your health care provider know about any changes, no matter how small.  If you are 40  or older, have a clinician do a breast exam (clinical breast exam or CBE) every year. Depending on your age, family history, and medical history, it may be recommended that you also have a yearly breast X-ray (mammogram).  If you  have a family history of breast cancer, talk with your health care provider about genetic screening.  If you are at high risk for breast cancer, talk with your health care provider about having an MRI and a mammogram every year.  Breast cancer (BRCA) gene test is recommended for women who have family members with BRCA-related cancers. Results of the assessment will determine the need for genetic counseling and BRCA1 and for BRCA2 testing. BRCA-related cancers include these types: ? Breast. This occurs in males or females. ? Ovarian. ? Tubal. This may also be called fallopian tube cancer. ? Cancer of the abdominal or pelvic lining (peritoneal cancer). ? Prostate. ? Pancreatic.  Cervical, Uterine, and Ovarian Cancer Your health care provider may recommend that you be screened regularly for cancer of the pelvic organs. These include your ovaries, uterus, and vagina. This screening involves a pelvic exam, which includes checking for microscopic changes to the surface of your cervix (Pap test).  For women ages 21-65, health care providers may recommend a pelvic exam and a Pap test every three years. For women ages 79-65, they may recommend the Pap test and pelvic exam, combined with testing for human papilloma virus (HPV), every five years. Some types of HPV increase your risk of cervical cancer. Testing for HPV may also be done on women of any age who have unclear Pap test results.  Other health care providers may not recommend any screening for nonpregnant women who are considered low risk for pelvic cancer and have no symptoms. Ask your health care provider if a screening pelvic exam is right for you.  If you have had past treatment for cervical cancer or a condition that could lead to cancer, you need Pap tests and screening for cancer for at least 20 years after your treatment. If Pap tests have been discontinued for you, your risk factors (such as having a new sexual partner) need to be  reassessed to determine if you should start having screenings again. Some women have medical problems that increase the chance of getting cervical cancer. In these cases, your health care provider may recommend that you have screening and Pap tests more often.  If you have a family history of uterine cancer or ovarian cancer, talk with your health care provider about genetic screening.  If you have vaginal bleeding after reaching menopause, tell your health care provider.  There are currently no reliable tests available to screen for ovarian cancer.  Lung Cancer Lung cancer screening is recommended for adults 69-62 years old who are at high risk for lung cancer because of a history of smoking. A yearly low-dose CT scan of the lungs is recommended if you:  Currently smoke.  Have a history of at least 30 pack-years of smoking and you currently smoke or have quit within the past 15 years. A pack-year is smoking an average of one pack of cigarettes per day for one year.  Yearly screening should:  Continue until it has been 15 years since you quit.  Stop if you develop a health problem that would prevent you from having lung cancer treatment.  Colorectal Cancer  This type of cancer can be detected and can often be prevented.  Routine colorectal cancer screening usually begins at  age 42 and continues through age 45.  If you have risk factors for colon cancer, your health care provider may recommend that you be screened at an earlier age.  If you have a family history of colorectal cancer, talk with your health care provider about genetic screening.  Your health care provider may also recommend using home test kits to check for hidden blood in your stool.  A small camera at the end of a tube can be used to examine your colon directly (sigmoidoscopy or colonoscopy). This is done to check for the earliest forms of colorectal cancer.  Direct examination of the colon should be repeated every  5-10 years until age 71. However, if early forms of precancerous polyps or small growths are found or if you have a family history or genetic risk for colorectal cancer, you may need to be screened more often.  Skin Cancer  Check your skin from head to toe regularly.  Monitor any moles. Be sure to tell your health care provider: ? About any new moles or changes in moles, especially if there is a change in a mole's shape or color. ? If you have a mole that is larger than the size of a pencil eraser.  If any of your family members has a history of skin cancer, especially at a young age, talk with your health care provider about genetic screening.  Always use sunscreen. Apply sunscreen liberally and repeatedly throughout the day.  Whenever you are outside, protect yourself by wearing long sleeves, pants, a wide-brimmed hat, and sunglasses.  What should I know about osteoporosis? Osteoporosis is a condition in which bone destruction happens more quickly than new bone creation. After menopause, you may be at an increased risk for osteoporosis. To help prevent osteoporosis or the bone fractures that can happen because of osteoporosis, the following is recommended:  If you are 46-71 years old, get at least 1,000 mg of calcium and at least 600 mg of vitamin D per day.  If you are older than age 55 but younger than age 65, get at least 1,200 mg of calcium and at least 600 mg of vitamin D per day.  If you are older than age 54, get at least 1,200 mg of calcium and at least 800 mg of vitamin D per day.  Smoking and excessive alcohol intake increase the risk of osteoporosis. Eat foods that are rich in calcium and vitamin D, and do weight-bearing exercises several times each week as directed by your health care provider. What should I know about how menopause affects my mental health? Depression may occur at any age, but it is more common as you become older. Common symptoms of depression  include:  Low or sad mood.  Changes in sleep patterns.  Changes in appetite or eating patterns.  Feeling an overall lack of motivation or enjoyment of activities that you previously enjoyed.  Frequent crying spells.  Talk with your health care provider if you think that you are experiencing depression. What should I know about immunizations? It is important that you get and maintain your immunizations. These include:  Tetanus, diphtheria, and pertussis (Tdap) booster vaccine.  Influenza every year before the flu season begins.  Pneumonia vaccine.  Shingles vaccine.  Your health care provider may also recommend other immunizations. This information is not intended to replace advice given to you by your health care provider. Make sure you discuss any questions you have with your health care provider. Document Released: 09/18/2005  Document Revised: 02/14/2016 Document Reviewed: 04/30/2015 [employer] Interactive Patient Education  2018 [employer]  

## 2017-10-22 ENCOUNTER — Other Ambulatory Visit: Payer: Self-pay | Admitting: Family Medicine

## 2017-10-22 ENCOUNTER — Ambulatory Visit
Admission: RE | Admit: 2017-10-22 | Discharge: 2017-10-22 | Disposition: A | Discharge status: Home / Self Care | Payer: PRIVATE HEALTH INSURANCE | Source: Ambulatory Visit | Attending: Family Medicine | Admitting: Family Medicine

## 2017-10-22 ENCOUNTER — Ambulatory Visit
Admission: RE | Admit: 2017-10-22 | Discharge: 2017-10-22 | Disposition: A | Discharge status: Home / Self Care | Payer: PRIVATE HEALTH INSURANCE | Source: Ambulatory Visit | Attending: *Deleted | Admitting: *Deleted

## 2017-10-22 ENCOUNTER — Other Ambulatory Visit: Payer: Self-pay | Admitting: *Deleted

## 2017-10-22 DIAGNOSIS — M19031 Primary osteoarthritis, right wrist: Secondary | ICD-10-CM | POA: Insufficient documentation

## 2017-10-22 DIAGNOSIS — S6991XA Unspecified injury of right wrist, hand and finger(s), initial encounter: Secondary | ICD-10-CM | POA: Insufficient documentation

## 2017-10-22 DIAGNOSIS — M25531 Pain in right wrist: Secondary | ICD-10-CM | POA: Insufficient documentation

## 2017-10-22 DIAGNOSIS — R52 Pain, unspecified: Secondary | ICD-10-CM

## 2017-10-22 DIAGNOSIS — F439 Reaction to severe stress, unspecified: Secondary | ICD-10-CM

## 2017-10-22 DIAGNOSIS — F329 Major depressive disorder, single episode, unspecified: Secondary | ICD-10-CM

## 2017-10-22 DIAGNOSIS — T1490XA Injury, unspecified, initial encounter: Secondary | ICD-10-CM

## 2017-10-22 DIAGNOSIS — F32A Depression, unspecified: Secondary | ICD-10-CM

## 2017-10-22 LAB — COMPREHENSIVE METABOLIC PANEL
ALBUMIN: 4.2 g/dL (ref 3.5–5.5)
ALK PHOS: 88 IU/L (ref 39–117)
ALT: 27 IU/L (ref 0–32)
AST: 23 IU/L (ref 0–40)
Albumin/Globulin Ratio: 1.4 (ref 1.2–2.2)
BUN / CREAT RATIO: 15 (ref 9–23)
BUN: 14 mg/dL (ref 6–24)
Bilirubin Total: 0.4 mg/dL (ref 0.0–1.2)
CHLORIDE: 101 mmol/L (ref 96–106)
CO2: 25 mmol/L (ref 20–29)
Calcium: 9.9 mg/dL (ref 8.7–10.2)
Creatinine, Ser: 0.91 mg/dL (ref 0.57–1.00)
GFR calc Af Amer: 82 mL/min/{1.73_m2} (ref >59)
GFR calc non Af Amer: 71 mL/min/{1.73_m2} (ref >59)
GLUCOSE: 97 mg/dL (ref 65–99)
Globulin, Total: 3.1 g/dL (ref 1.5–4.5)
Potassium: 3.8 mmol/L (ref 3.5–5.2)
SODIUM: 142 mmol/L (ref 134–144)
Total Protein: 7.3 g/dL (ref 6.0–8.5)

## 2017-10-22 LAB — LIPID PANEL
Chol/HDL Ratio: 4.8 ratio — ABNORMAL HIGH (ref 0.0–4.4)
Cholesterol, Total: 187 mg/dL (ref 100–199)
HDL: 39 mg/dL — ABNORMAL LOW (ref >39)
LDL Calculated: 126 mg/dL — ABNORMAL HIGH (ref 0–99)
Triglycerides: 109 mg/dL (ref 0–149)
VLDL Cholesterol Cal: 22 mg/dL (ref 5–40)

## 2017-10-22 LAB — CBC WITH DIFFERENTIAL/PLATELET
BASOS ABS: 0 10*3/uL (ref 0.0–0.2)
Basos: 0 %
EOS (ABSOLUTE): 0.2 10*3/uL (ref 0.0–0.4)
Eos: 3 %
Hematocrit: 40.2 % (ref 34.0–46.6)
Hemoglobin: 12.8 g/dL (ref 11.1–15.9)
Immature Grans (Abs): 0 10*3/uL (ref 0.0–0.1)
Immature Granulocytes: 0 %
LYMPHS ABS: 3.3 10*3/uL — AB (ref 0.7–3.1)
Lymphs: 50 %
MCH: 25.9 pg — ABNORMAL LOW (ref 26.6–33.0)
MCHC: 31.8 g/dL (ref 31.5–35.7)
MCV: 81 fL (ref 79–97)
MONOCYTES: 8 %
MONOS ABS: 0.5 10*3/uL (ref 0.1–0.9)
Neutrophils Absolute: 2.6 10*3/uL (ref 1.4–7.0)
Neutrophils: 39 %
Platelets: 281 10*3/uL (ref 150–379)
RBC: 4.95 x10E6/uL (ref 3.77–5.28)
RDW: 14 % (ref 12.3–15.4)
WBC: 6.7 10*3/uL (ref 3.4–10.8)

## 2017-10-22 LAB — HEMOGLOBIN A1C
ESTIMATED AVERAGE GLUCOSE: 128 mg/dL
HEMOGLOBIN A1C: 6.1 % — AB (ref 4.8–5.6)

## 2017-10-22 LAB — TSH: TSH: 1.83 u[IU]/mL (ref 0.450–4.500)

## 2017-10-25 MED ORDER — SERTRALINE HCL 50 MG PO TABS: 50.0000 mg | ORAL_TABLET | Freq: Every day | ORAL | 3 refills | Status: DC

## 2017-10-26 ENCOUNTER — Telehealth: Payer: Self-pay

## 2017-10-26 NOTE — Telephone Encounter (Signed)
-----   Message from Mar Daring, PA-C sent at 10/26/2017  9:44 AM EDT ----- A1c stable. Cholesterol up just slightly from last year. Blood count normal. Kidney and liver function normal. Thyroid normal.

## 2017-10-26 NOTE — Telephone Encounter (Signed)
Patient advised as below.  

## 2017-11-05 ENCOUNTER — Ambulatory Visit: Payer: BC Managed Care – PPO | Attending: Otolaryngology

## 2017-11-05 DIAGNOSIS — G4733 Obstructive sleep apnea (adult) (pediatric): Secondary | ICD-10-CM | POA: Diagnosis not present

## 2017-11-05 DIAGNOSIS — R0683 Snoring: Secondary | ICD-10-CM | POA: Diagnosis present

## 2017-11-19 ENCOUNTER — Ambulatory Visit: Payer: BC Managed Care – PPO | Attending: Neurology

## 2017-11-19 DIAGNOSIS — G4733 Obstructive sleep apnea (adult) (pediatric): Secondary | ICD-10-CM | POA: Diagnosis not present

## 2017-11-26 ENCOUNTER — Telehealth: Payer: Self-pay | Admitting: Family Medicine

## 2017-11-26 NOTE — Telephone Encounter (Signed)
Pt Called saying the sleep study company called her for a fitting for a c pap.  She had a sleep study ordered by Sonia Baller and done recently.  They asked her if she had gotten the results back form her sleep study but she has not heard anything.  Pt's call back is [phone]  Thanks teri

## 2017-11-26 NOTE — Telephone Encounter (Signed)
Please review. Thanks!  

## 2017-11-29 NOTE — Telephone Encounter (Signed)
Yes I did. She has sleep apnea and they are recommending CPAP.

## 2017-11-30 NOTE — Telephone Encounter (Signed)
Informed patient of her sleep apnea.  She has an appointment Friday for follow up.

## 2018-01-05 ENCOUNTER — Other Ambulatory Visit: Payer: Self-pay | Admitting: Family Medicine

## 2018-01-24 ENCOUNTER — Ambulatory Visit: Payer: PRIVATE HEALTH INSURANCE | Attending: Orthopaedic Surgery | Admitting: Occupational Therapy

## 2018-01-24 ENCOUNTER — Encounter: Payer: Self-pay | Admitting: Occupational Therapy

## 2018-01-24 DIAGNOSIS — R278 Other lack of coordination: Secondary | ICD-10-CM | POA: Insufficient documentation

## 2018-01-24 DIAGNOSIS — M25531 Pain in right wrist: Secondary | ICD-10-CM | POA: Insufficient documentation

## 2018-01-24 DIAGNOSIS — S6981XA Other specified injuries of right wrist, hand and finger(s), initial encounter: Secondary | ICD-10-CM | POA: Diagnosis present

## 2018-01-24 DIAGNOSIS — M6281 Muscle weakness (generalized): Secondary | ICD-10-CM | POA: Insufficient documentation

## 2018-01-24 DIAGNOSIS — M25631 Stiffness of right wrist, not elsewhere classified: Secondary | ICD-10-CM | POA: Diagnosis present

## 2018-01-27 ENCOUNTER — Ambulatory Visit: Payer: PRIVATE HEALTH INSURANCE | Admitting: Occupational Therapy

## 2018-01-27 DIAGNOSIS — M25531 Pain in right wrist: Secondary | ICD-10-CM | POA: Diagnosis not present

## 2018-01-27 DIAGNOSIS — M6281 Muscle weakness (generalized): Secondary | ICD-10-CM

## 2018-01-27 DIAGNOSIS — R278 Other lack of coordination: Secondary | ICD-10-CM

## 2018-01-27 DIAGNOSIS — M25631 Stiffness of right wrist, not elsewhere classified: Secondary | ICD-10-CM

## 2018-01-27 DIAGNOSIS — S6981XA Other specified injuries of right wrist, hand and finger(s), initial encounter: Secondary | ICD-10-CM

## 2018-01-29 ENCOUNTER — Encounter: Payer: Self-pay | Admitting: Occupational Therapy
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
## 2018-01-31 ENCOUNTER — Ambulatory Visit: Payer: PRIVATE HEALTH INSURANCE | Admitting: Occupational Therapy

## 2018-01-31 DIAGNOSIS — R278 Other lack of coordination: Secondary | ICD-10-CM

## 2018-01-31 DIAGNOSIS — M25631 Stiffness of right wrist, not elsewhere classified: Secondary | ICD-10-CM

## 2018-01-31 DIAGNOSIS — M25531 Pain in right wrist: Secondary | ICD-10-CM

## 2018-01-31 DIAGNOSIS — S6981XA Other specified injuries of right wrist, hand and finger(s), initial encounter: Secondary | ICD-10-CM

## 2018-01-31 DIAGNOSIS — M6281 Muscle weakness (generalized): Secondary | ICD-10-CM

## 2018-02-02 ENCOUNTER — Ambulatory Visit: Payer: PRIVATE HEALTH INSURANCE | Admitting: Occupational Therapy

## 2018-02-03 ENCOUNTER — Encounter: Payer: Self-pay | Admitting: Occupational Therapy
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
## 2018-02-07 ENCOUNTER — Ambulatory Visit: Payer: PRIVATE HEALTH INSURANCE | Attending: Orthopaedic Surgery | Admitting: Occupational Therapy

## 2018-02-07 DIAGNOSIS — R278 Other lack of coordination: Secondary | ICD-10-CM | POA: Insufficient documentation

## 2018-02-07 DIAGNOSIS — S6981XA Other specified injuries of right wrist, hand and finger(s), initial encounter: Secondary | ICD-10-CM | POA: Insufficient documentation

## 2018-02-07 DIAGNOSIS — M6281 Muscle weakness (generalized): Secondary | ICD-10-CM | POA: Diagnosis present

## 2018-02-07 DIAGNOSIS — M25531 Pain in right wrist: Secondary | ICD-10-CM | POA: Diagnosis not present

## 2018-02-07 DIAGNOSIS — M25631 Stiffness of right wrist, not elsewhere classified: Secondary | ICD-10-CM | POA: Insufficient documentation

## 2018-02-09 ENCOUNTER — Encounter: Payer: Self-pay | Admitting: Occupational Therapy
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
## 2018-02-11 ENCOUNTER — Ambulatory Visit: Payer: PRIVATE HEALTH INSURANCE | Admitting: Occupational Therapy

## 2018-02-11 ENCOUNTER — Encounter: Payer: Self-pay | Admitting: Occupational Therapy

## 2018-02-11 DIAGNOSIS — S6981XA Other specified injuries of right wrist, hand and finger(s), initial encounter: Secondary | ICD-10-CM

## 2018-02-11 DIAGNOSIS — M25531 Pain in right wrist: Secondary | ICD-10-CM

## 2018-02-11 DIAGNOSIS — M6281 Muscle weakness (generalized): Secondary | ICD-10-CM

## 2018-02-11 DIAGNOSIS — R278 Other lack of coordination: Secondary | ICD-10-CM

## 2018-02-11 DIAGNOSIS — M25631 Stiffness of right wrist, not elsewhere classified: Secondary | ICD-10-CM
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
## 2018-02-15 ENCOUNTER — Ambulatory Visit: Payer: PRIVATE HEALTH INSURANCE | Admitting: Occupational Therapy

## 2018-02-15 DIAGNOSIS — M25631 Stiffness of right wrist, not elsewhere classified: Secondary | ICD-10-CM

## 2018-02-15 DIAGNOSIS — M25531 Pain in right wrist: Secondary | ICD-10-CM

## 2018-02-15 DIAGNOSIS — M6281 Muscle weakness (generalized): Secondary | ICD-10-CM

## 2018-02-15 DIAGNOSIS — R278 Other lack of coordination: Secondary | ICD-10-CM

## 2018-02-15 DIAGNOSIS — S6981XA Other specified injuries of right wrist, hand and finger(s), initial encounter: Secondary | ICD-10-CM

## 2018-02-17 ENCOUNTER — Encounter: Payer: Self-pay | Admitting: Occupational Therapy

## 2018-02-17 NOTE — Therapy (Signed)
[place] [facility] [address] [place], [address] Phone: [phone]   Fax:  [phone]  Occupational Therapy Treatment  Patient Details  Name: Miranda Moore MRN: [id number] Date of Birth: 12/21/1960 Referring Provider: Peggye Ley   Encounter Date: 02/15/2018  OT End of Session - 02/17/18 0811    Visit Number  6    Number of Visits  10    Date for OT Re-Evaluation  03/07/18    Authorization Type  workers compensation    Authorization Time Period  approved 10 visits, this is 5/10    OT Start Time  1600    OT Stop Time  1647    OT Time Calculation (min)  47 min    Activity Tolerance  Patient limited by pain    Behavior During Therapy  [facility] for tasks assessed/performed       Past Medical History:  Diagnosis Date  . Hypertension     Past Surgical History:  Procedure Laterality Date  . ABDOMINAL HYSTERECTOMY  2002  . APPENDECTOMY  2013   laparascopic with removal of tubal remnant  . COLONOSCOPY  2015  . KNEE SURGERY  2012  . LAPAROSCOPIC OOPHORECTOMY  2012    There were no vitals filed for this visit.  Subjective Assessment - 02/17/18 0808    Subjective   Patient reports she had increased pain over the weekend and is not sure why.  she reports Sunday it was a 10/10 pain on the ulnar side of her hand and felt like it was something cutting her on the inside like a repeated paper cut.  States it was also throbbing all day and she did not get any sleep.  Today she feels better and has pain 4/10.  She has a MD appt for Thursday this week.     Limitations  Patient current restrictions include no lifting, pushing, pulling greater than 1-2# at work and at home    Patient Stated Goals  Patient's goal is to get back to being independent with all tasks including work and home.    Currently in Pain?  Yes    Pain Score  4     Pain Location  Wrist    Pain Orientation  Right    Pain Descriptors / Indicators  Aching    Pain  Type  Acute pain    Pain Onset  More than a month ago    Pain Frequency  Constant    Multiple Pain Sites  No         Attempted Fluidotherapy this date to right hand and forearm, temp 109, however, after 3 minutes, patient reports she could not tolerate the treatment. Patient reported increased edema over the weekend and reported her ring was tighter on her finger.  She denies any change in activity and does not recall any tasks that could have contributed to increasing her pain over the weekend or the days prior.  Switched to contrast of warm/cold to decrease edema and help to decrease and manage pain prior to exercises.      Patient seen for therex with continued emphasis on AROM of right wrist, forearm and digits within her pain tolerance for wrist flexion, extension, UD, RD Unable to tolerate any light weight this date and all exercises performed without weight.   Supination, pronation, digit flexion/ext, ABD,ADD. Tendon gliding exercises with cues,prayer stretch, cues for exercises for proper form and technique. Right supination 55 pronation 85 west extension  50, wrist flexion 45 pain with extension 4/10, pain with flexion 2/10.  No pain with UD, RD   Attempts at Desensitization techniques to right wrist on ulnar side  however patient could not tolerate therapist touching her right wrist especially at the ulnar side of the hand and demonstrates increased hypersensitivity.   Patient performed selected self directed desensitization in the clinic and encouraged to perform  home program and will continue with objects at home and recommended adding one more texture this week.                     OT Education - 02/17/18 [id number]    Education Details  HEP, desensitization    Person(s) Educated  Patient    Methods  Explanation;Demonstration    Comprehension  Verbalized understanding;Returned demonstration       OT Short Term Goals - 01/29/18 1305      OT SHORT TERM GOAL  #1   Title  Patient to show reduction in edema of right wrist by 1 cm on the dorsum of the hand, .5 cm at the wrist and forearm to decrease pain    Baseline  see flowsheet for current measurements with comparison right and left.    Time  3    Period  Weeks    Status  New    Target Date  02/14/18      OT SHORT TERM GOAL #2   Title  Patient will demonstrate understanding of and implement home program to address hypersensitivity of right wrist.    Baseline  Patient unable to tolerate light touch at evaluation.     Time  3    Period  Weeks    Status  New    Target Date  02/14/18        OT Long Term Goals - 02/17/18 0824      OT LONG TERM GOAL #1   Title  Patient will demonstrate improved wrist flexion and extension to within normal limits to be able to hold items in right hand.     Baseline  limited wrist motion, see FS for details    Time  6    Period  Weeks    Status  On-going      OT LONG TERM GOAL #2   Title  Patient will decrease pain in wrist to a 2/10 or less to be able to engage in daily self care tasks with modified independence.     Baseline  dificulty with negotiation of pants, putting on shoes.     Time  6    Period  Weeks    Status  On-going      OT LONG TERM GOAL #3   Title  Patient will be independent with home exercise program for right wrist and hand    Baseline  no current program    Time  6    Period  Weeks    Status  On-going      OT LONG TERM GOAL #4   Title  Will add work tasks to program as patient progresses    Baseline  light duty currently    Time  6    Period  Weeks    Status  On-going            Plan - 02/17/18 0819    Clinical Impression Statement  Patient with increased pain in right wrist over the weekend and states it was a 10/10, she could not sleep.  With activity analysis, patient does not recall any change in activities or tasks prior to the weekend or during the weekend that could have exacerbated the increased pain and  decreased use.  She remains hypersensitive to touch, even light touch esepecially to the ulnar side of the right hand.  She was unable to tolerate fluidotherapy this date or desensitization techniques by therapist.  Her pain is less today than what she reported on the weekend and is back to baseline of the last few weeks however decreased toleration of therapy.  She will follow up with MD on thursday of this week and encouraged patient to discuss hypersensitivity and be specific in regards to describing  pain and location.       Occupational Profile and client history currently impacting functional performance  injury since 10/11/2017, currently on light duty at work, lifting, pushing and pulling restrictions 1-2#, hypersensitivity and increased pain    Occupational performance deficits (Please refer to evaluation for details):  ADL's;IADL's;Work;Leisure    Rehab Potential  Good    Current Impairments/barriers affecting progress:  length of time since injury, pain, hypersensitivity to right wrist and hand    OT Frequency  2x / week    OT Duration  6 weeks    OT Treatment/Interventions  Self-care/ADL training;Iontophoresis;Therapeutic exercise;Moist Heat;Paraffin;Neuromuscular education;Splinting;Patient/family education;Fluidtherapy;Therapeutic activities;Cryotherapy;Contrast Bath;DME and/or AE instruction;Manual Therapy;Passive range of motion    Consulted and Agree with Plan of Care  Patient       Patient will benefit from skilled therapeutic intervention in order to improve the following deficits and impairments:  Increased edema, Impaired flexibility, Pain, Decreased coordination, Impaired sensation, Decreased activity tolerance, Decreased range of motion, Decreased strength, Impaired UE functional use  Visit Diagnosis: Pain in right wrist  Stiffness of right wrist joint  Muscle weakness (generalized)  Other lack of coordination  TFCC (triangular fibrocartilage complex) injury, right,  initial encounter    Problem List Patient Active Problem List   Diagnosis Date Noted  . Abscess of axilla, right 09/09/2015  . Abortion, spontaneous 05/31/2015  . Edema leg 05/31/2015  . Chest discomfort 05/31/2015  . Cough 05/31/2015  . Diabetes mellitus ([place]) 05/31/2015  . Accumulation of fluid in tissues 05/31/2015  . Big thyroid 05/31/2015  . Cardiac murmur 05/31/2015  . Hot flash, menopausal 05/31/2015  . LBP (low back pain) 05/31/2015  . Acute thoracic back pain 05/31/2015  . Multinodular goiter 05/31/2015  . Adiposity 05/31/2015  . Abnormal blood sugar 05/31/2015  . Cutaneous eruption 05/31/2015  . Avitaminosis D 05/31/2015  . Anxiety 02/15/2015  . Accessory skin tags 11/29/2014  . Esophagitis, reflux 04/27/2008  . Other general symptoms and signs 10/05/2006  . Pure hypercholesterolemia 09/29/2006  . 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 Acute stress disorder 02/18/2006  . 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 Bone/cartilage disorder 07/29/2005  . Benign essential HTN 07/10/2005   Miranda Moore, OTR/L, CLT  Ryonna Cimini 02/17/2018, 8:25 AM  [place] [facility] [address] [place], [address] Phone: [phone]   Fax:  [phone]  Name: Miranda Moore MRN: [id number] Date of Birth: 11-May-1961

## 2018-02-18 ENCOUNTER — Ambulatory Visit: Payer: PRIVATE HEALTH INSURANCE | Admitting: Occupational Therapy

## 2018-03-24 ENCOUNTER — Other Ambulatory Visit: Payer: Self-pay | Admitting: Orthopaedic Surgery

## 2018-03-24 DIAGNOSIS — S63511D Sprain of carpal joint of right wrist, subsequent encounter: Secondary | ICD-10-CM

## 2018-03-29 ENCOUNTER — Ambulatory Visit
Admission: RE | Admit: 2018-03-29 | Discharge: 2018-03-29 | Disposition: A | Discharge status: Home / Self Care | Payer: PRIVATE HEALTH INSURANCE | Source: Ambulatory Visit | Attending: Orthopaedic Surgery | Admitting: Orthopaedic Surgery

## 2018-03-29 DIAGNOSIS — S63511D Sprain of carpal joint of right wrist, subsequent encounter: Secondary | ICD-10-CM | POA: Diagnosis not present

## 2018-03-29 DIAGNOSIS — X58XXXD Exposure to other specified factors, subsequent encounter: Secondary | ICD-10-CM | POA: Insufficient documentation

## 2018-03-29 DIAGNOSIS — M19031 Primary osteoarthritis, right wrist: Secondary | ICD-10-CM | POA: Insufficient documentation

## 2018-03-29 MED ORDER — GADOBENATE DIMEGLUMINE 529 MG/ML IV SOLN
5.0000 mL | Freq: Once | INTRAVENOUS | Status: AC | PRN
  Administered 2018-03-29: 0.05 mL via INTRAVENOUS

## 2018-03-29 MED ORDER — IOPAMIDOL (ISOVUE-300) INJECTION 61%
50.0000 mL | Freq: Once | INTRAVENOUS | Status: AC | PRN
  Administered 2018-03-29: 5 mL

## 2018-05-11 ENCOUNTER — Other Ambulatory Visit: Payer: Self-pay | Admitting: Family Medicine

## 2018-05-11 DIAGNOSIS — R600 Localized edema: Secondary | ICD-10-CM

## 2018-05-29 ENCOUNTER — Other Ambulatory Visit: Payer: Self-pay | Admitting: Physician Assistant

## 2018-05-29 DIAGNOSIS — F439 Reaction to severe stress, unspecified: Secondary | ICD-10-CM

## 2018-05-30 NOTE — Telephone Encounter (Signed)
I only saw patient once for CPE. She is normally a Recruitment consultant patient.

## 2018-05-30 NOTE — Telephone Encounter (Signed)
PLEASE SEE PREVIOUS NOTE

## 2018-05-30 NOTE — Telephone Encounter (Signed)
Advised pt that she will need an appointment for refills.  She will call back to schedule.  dbs,cma

## 2018-06-03 ENCOUNTER — Telehealth: Payer: Self-pay | Admitting: Family Medicine

## 2018-06-03 ENCOUNTER — Encounter: Payer: Self-pay | Admitting: Family Medicine

## 2018-06-03 ENCOUNTER — Other Ambulatory Visit: Payer: Self-pay | Admitting: Physician Assistant

## 2018-06-03 ENCOUNTER — Ambulatory Visit: Payer: BC Managed Care – PPO | Admitting: Family Medicine

## 2018-06-03 VITALS — BP 144/69 | HR 64 | Temp 97.8°F | Resp 18 | Wt 248.6 lb

## 2018-06-03 DIAGNOSIS — I10 Essential (primary) hypertension: Secondary | ICD-10-CM

## 2018-06-03 DIAGNOSIS — Z1159 Encounter for screening for other viral diseases: Secondary | ICD-10-CM | POA: Diagnosis not present

## 2018-06-03 DIAGNOSIS — F419 Anxiety disorder, unspecified: Secondary | ICD-10-CM

## 2018-06-03 DIAGNOSIS — R7303 Prediabetes: Secondary | ICD-10-CM

## 2018-06-03 DIAGNOSIS — F439 Reaction to severe stress, unspecified: Secondary | ICD-10-CM

## 2018-06-03 MED ORDER — ALPRAZOLAM 0.5 MG PO TABS: ORAL_TABLET | ORAL | 1 refills | Status: DC

## 2018-06-03 NOTE — Telephone Encounter (Signed)
Patient was advised.  

## 2018-06-03 NOTE — Telephone Encounter (Signed)
Done

## 2018-06-03 NOTE — Telephone Encounter (Signed)
Pt needing refills on:  ALPRAZolam Duanne Moron) 0.5 MG tablet   Please fill at:  [facility] [place] [facility] [phone] (Phone) [phone] (Fax)   Thanks, [facility]
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
## 2018-06-06 ENCOUNTER — Telehealth: Payer: Self-pay | Admitting: Family Medicine

## 2018-06-06 NOTE — Telephone Encounter (Signed)
hBuffy at [facility] called asking for a refill on pt's Alprazolam 0.5 mg 1 to 2 tablets by mouth 3 times a day.   They can take a verbal ok at  [facility] #  [phone]  Thanks teri

## 2018-06-06 NOTE — Telephone Encounter (Signed)
Tyrone Apple the pharmacists  at [facility] to refill Xanax 0.5 mg tablets , 1-2 tablets by mouth 3 times a daily prn, 90 quantity only 30 day supply per [place].

## 2018-06-06 NOTE — Telephone Encounter (Signed)
Sent refills electronically on 06-03-18. Ask [facility] to check and see if they received it.

## 2018-06-06 NOTE — Telephone Encounter (Signed)
[facility] called saying patient needs refill on

## 2018-07-11 ENCOUNTER — Other Ambulatory Visit: Payer: Self-pay | Admitting: Orthopedic Surgery

## 2018-07-11 ENCOUNTER — Other Ambulatory Visit (HOSPITAL_COMMUNITY): Payer: Self-pay | Admitting: Orthopedic Surgery

## 2018-07-11 DIAGNOSIS — M25531 Pain in right wrist: Secondary | ICD-10-CM

## 2018-07-19 ENCOUNTER — Ambulatory Visit (HOSPITAL_COMMUNITY)
Admission: RE | Admit: 2018-07-19 | Discharge: 2018-07-19 | Disposition: A | Discharge status: Home / Self Care | Payer: PRIVATE HEALTH INSURANCE | Source: Ambulatory Visit | Attending: Orthopedic Surgery | Admitting: Orthopedic Surgery

## 2018-07-19 DIAGNOSIS — M25531 Pain in right wrist: Secondary | ICD-10-CM | POA: Diagnosis present

## 2018-07-19 DIAGNOSIS — M19031 Primary osteoarthritis, right wrist: Secondary | ICD-10-CM | POA: Diagnosis not present

## 2018-07-26 ENCOUNTER — Other Ambulatory Visit: Payer: Self-pay | Admitting: Family Medicine

## 2018-07-26 DIAGNOSIS — F439 Reaction to severe stress, unspecified: Secondary | ICD-10-CM

## 2018-07-26 DIAGNOSIS — F32A Depression, unspecified: Secondary | ICD-10-CM

## 2018-07-26 DIAGNOSIS — F329 Major depressive disorder, single episode, unspecified: Secondary | ICD-10-CM

## 2018-07-26 NOTE — Telephone Encounter (Signed)
Remind patient to get labs done as ordered on 06-03-18. Refilled medications.

## 2018-07-26 NOTE — Telephone Encounter (Signed)
Patient was advised.  

## 2018-08-23 ENCOUNTER — Other Ambulatory Visit: Payer: Self-pay

## 2018-08-23 ENCOUNTER — Encounter (HOSPITAL_BASED_OUTPATIENT_CLINIC_OR_DEPARTMENT_OTHER): Payer: Self-pay | Admitting: *Deleted

## 2018-08-25 ENCOUNTER — Encounter (HOSPITAL_BASED_OUTPATIENT_CLINIC_OR_DEPARTMENT_OTHER)
Admission: RE | Admit: 2018-08-25 | Discharge: 2018-08-25 | Disposition: A | Discharge status: Home / Self Care | Payer: PRIVATE HEALTH INSURANCE | Source: Ambulatory Visit | Attending: Orthopedic Surgery | Admitting: Orthopedic Surgery

## 2018-08-25 DIAGNOSIS — Z01818 Encounter for other preprocedural examination: Secondary | ICD-10-CM | POA: Insufficient documentation

## 2018-08-25 DIAGNOSIS — R9431 Abnormal electrocardiogram [ECG] [EKG]: Secondary | ICD-10-CM | POA: Diagnosis not present

## 2018-08-25 DIAGNOSIS — I10 Essential (primary) hypertension: Secondary | ICD-10-CM | POA: Insufficient documentation

## 2018-08-25 NOTE — Progress Notes (Signed)
EKG reviewed by Dr. Odonno, will proceed with surgery as scheduled. 

## 2018-08-26 LAB — BASIC METABOLIC PANEL
Anion gap: 10 (ref 5–15)
BUN: 12 mg/dL (ref 6–20)
CHLORIDE: 107 mmol/L (ref 98–111)
CO2: 26 mmol/L (ref 22–32)
Calcium: 9.5 mg/dL (ref 8.9–10.3)
Creatinine, Ser: 1.04 mg/dL — ABNORMAL HIGH (ref 0.44–1.00)
GFR calc Af Amer: >60 mL/min (ref >60)
GFR calc non Af Amer: 60 mL/min — ABNORMAL LOW (ref >60)
Glucose, Bld: 91 mg/dL (ref 70–99)
Potassium: 4.3 mmol/L (ref 3.5–5.1)
SODIUM: 143 mmol/L (ref 135–145)

## 2018-08-30 ENCOUNTER — Encounter (HOSPITAL_BASED_OUTPATIENT_CLINIC_OR_DEPARTMENT_OTHER)
Admission: RE | Disposition: A | Discharge status: Home / Self Care | Payer: Self-pay | Source: Home / Self Care | Attending: Orthopedic Surgery

## 2018-08-30 ENCOUNTER — Ambulatory Visit (HOSPITAL_BASED_OUTPATIENT_CLINIC_OR_DEPARTMENT_OTHER): Payer: PRIVATE HEALTH INSURANCE | Admitting: Anesthesiology

## 2018-08-30 ENCOUNTER — Encounter (HOSPITAL_BASED_OUTPATIENT_CLINIC_OR_DEPARTMENT_OTHER): Payer: Self-pay | Admitting: *Deleted

## 2018-08-30 ENCOUNTER — Ambulatory Visit (HOSPITAL_BASED_OUTPATIENT_CLINIC_OR_DEPARTMENT_OTHER)
Admission: RE | Admit: 2018-08-30 | Discharge: 2018-08-30 | Disposition: A | Discharge status: Home / Self Care | Payer: PRIVATE HEALTH INSURANCE | Attending: Orthopedic Surgery | Admitting: Orthopedic Surgery

## 2018-08-30 DIAGNOSIS — Z7982 Long term (current) use of aspirin: Secondary | ICD-10-CM | POA: Insufficient documentation

## 2018-08-30 DIAGNOSIS — R7303 Prediabetes: Secondary | ICD-10-CM | POA: Diagnosis not present

## 2018-08-30 DIAGNOSIS — M25531 Pain in right wrist: Secondary | ICD-10-CM | POA: Insufficient documentation

## 2018-08-30 DIAGNOSIS — S63511A Sprain of carpal joint of right wrist, initial encounter: Secondary | ICD-10-CM | POA: Diagnosis not present

## 2018-08-30 DIAGNOSIS — G4730 Sleep apnea, unspecified: Secondary | ICD-10-CM | POA: Diagnosis not present

## 2018-08-30 DIAGNOSIS — K219 Gastro-esophageal reflux disease without esophagitis: Secondary | ICD-10-CM | POA: Insufficient documentation

## 2018-08-30 DIAGNOSIS — I10 Essential (primary) hypertension: Secondary | ICD-10-CM | POA: Diagnosis not present

## 2018-08-30 DIAGNOSIS — Z79899 Other long term (current) drug therapy: Secondary | ICD-10-CM | POA: Diagnosis not present

## 2018-08-30 DIAGNOSIS — F329 Major depressive disorder, single episode, unspecified: Secondary | ICD-10-CM | POA: Diagnosis not present

## 2018-08-30 DIAGNOSIS — F419 Anxiety disorder, unspecified: Secondary | ICD-10-CM | POA: Diagnosis not present

## 2018-08-30 DIAGNOSIS — E7800 Pure hypercholesterolemia, unspecified: Secondary | ICD-10-CM | POA: Diagnosis not present

## 2018-08-30 DIAGNOSIS — Z882 Allergy status to sulfonamides status: Secondary | ICD-10-CM | POA: Diagnosis not present

## 2018-08-30 DIAGNOSIS — X58XXXA Exposure to other specified factors, initial encounter: Secondary | ICD-10-CM | POA: Insufficient documentation

## 2018-08-30 HISTORY — DX: Prediabetes: R73.03

## 2018-08-30 HISTORY — PX: WRIST ARTHROSCOPY WITH DEBRIDEMENT: SHX6194

## 2018-08-30 HISTORY — DX: Depression, unspecified: F32.A

## 2018-08-30 HISTORY — DX: Gastro-esophageal reflux disease without esophagitis: K21.9

## 2018-08-30 HISTORY — DX: Anxiety disorder, unspecified: F41.9

## 2018-08-30 HISTORY — DX: Sleep apnea, unspecified: G47.30

## 2018-08-30 HISTORY — DX: Methicillin resistant Staphylococcus aureus infection, unspecified site: A49.02

## 2018-08-30 HISTORY — DX: Major depressive disorder, single episode, unspecified: F32.9

## 2018-08-30 SURGERY — WRIST ARTHROSCOPY WITH DEBRIDEMENT
Anesthesia: General | Site: Wrist | Laterality: Right

## 2018-08-30 MED ORDER — CEFAZOLIN SODIUM-DEXTROSE 2-4 GM/100ML-% IV SOLN
2.0000 g | INTRAVENOUS | Status: AC
  Administered 2018-08-30: 2 g via INTRAVENOUS

## 2018-08-30 MED ORDER — LIDOCAINE 2% (20 MG/ML) 5 ML SYRINGE
INTRAMUSCULAR | Status: DC | PRN
  Administered 2018-08-30: 60 mg via INTRAVENOUS

## 2018-08-30 MED ORDER — MIDAZOLAM HCL 2 MG/2ML IJ SOLN
1.0000 mg | INTRAMUSCULAR | Status: DC | PRN
  Administered 2018-08-30: 2 mg via INTRAVENOUS

## 2018-08-30 MED ORDER — PHENYLEPHRINE 40 MCG/ML (10ML) SYRINGE FOR IV PUSH (FOR BLOOD PRESSURE SUPPORT)
PREFILLED_SYRINGE | INTRAVENOUS | Status: AC
  Filled 2018-08-30: qty 10

## 2018-08-30 MED ORDER — DEXAMETHASONE SODIUM PHOSPHATE 10 MG/ML IJ SOLN
INTRAMUSCULAR | Status: AC
  Filled 2018-08-30: qty 1

## 2018-08-30 MED ORDER — EPHEDRINE SULFATE-NACL 50-0.9 MG/10ML-% IV SOSY
PREFILLED_SYRINGE | INTRAVENOUS | Status: DC | PRN
  Administered 2018-08-30: 10 mg via INTRAVENOUS
  Administered 2018-08-30: 15 mg via INTRAVENOUS
  Administered 2018-08-30 (×2): 10 mg via INTRAVENOUS

## 2018-08-30 MED ORDER — FENTANYL CITRATE (PF) 100 MCG/2ML IJ SOLN
INTRAMUSCULAR | Status: AC
  Filled 2018-08-30: qty 2

## 2018-08-30 MED ORDER — FENTANYL CITRATE (PF) 100 MCG/2ML IJ SOLN
50.0000 ug | INTRAMUSCULAR | Status: AC | PRN
  Administered 2018-08-30: 100 ug via INTRAVENOUS
  Administered 2018-08-30: 25 ug via INTRAVENOUS
  Administered 2018-08-30: 50 ug via INTRAVENOUS
  Administered 2018-08-30: 25 ug via INTRAVENOUS

## 2018-08-30 MED ORDER — CEFAZOLIN SODIUM-DEXTROSE 2-4 GM/100ML-% IV SOLN
INTRAVENOUS | Status: AC
  Filled 2018-08-30: qty 100

## 2018-08-30 MED ORDER — LIDOCAINE 2% (20 MG/ML) 5 ML SYRINGE
INTRAMUSCULAR | Status: AC
  Filled 2018-08-30: qty 5

## 2018-08-30 MED ORDER — MIDAZOLAM HCL 2 MG/2ML IJ SOLN
INTRAMUSCULAR | Status: AC
  Filled 2018-08-30: qty 2

## 2018-08-30 MED ORDER — FENTANYL CITRATE (PF) 100 MCG/2ML IJ SOLN
25.0000 ug | INTRAMUSCULAR | Status: DC | PRN
  Administered 2018-08-30 (×2): 50 ug via INTRAVENOUS

## 2018-08-30 MED ORDER — ONDANSETRON HCL 4 MG/2ML IJ SOLN
INTRAMUSCULAR | Status: AC
  Filled 2018-08-30: qty 2

## 2018-08-30 MED ORDER — CHLORHEXIDINE GLUCONATE 4 % EX LIQD: 60.0000 mL | Freq: Once | CUTANEOUS | Status: DC

## 2018-08-30 MED ORDER — PROPOFOL 10 MG/ML IV BOLUS
INTRAVENOUS | Status: DC | PRN
  Administered 2018-08-30: 120 mg via INTRAVENOUS

## 2018-08-30 MED ORDER — METOCLOPRAMIDE HCL 5 MG/ML IJ SOLN: 10.0000 mg | Freq: Once | INTRAMUSCULAR | Status: DC | PRN

## 2018-08-30 MED ORDER — ONDANSETRON HCL 4 MG/2ML IJ SOLN
INTRAMUSCULAR | Status: DC | PRN
  Administered 2018-08-30: 4 mg via INTRAVENOUS

## 2018-08-30 MED ORDER — EPHEDRINE 5 MG/ML INJ
INTRAVENOUS | Status: AC
  Filled 2018-08-30: qty 10

## 2018-08-30 MED ORDER — BUPIVACAINE HCL (PF) 0.5 % IJ SOLN
INTRAMUSCULAR | Status: AC
  Filled 2018-08-30: qty 30

## 2018-08-30 MED ORDER — BUPIVACAINE HCL (PF) 0.5 % IJ SOLN
INTRAMUSCULAR | Status: DC | PRN
  Administered 2018-08-30: 10 mL via INTRA_ARTICULAR

## 2018-08-30 MED ORDER — MEPERIDINE HCL 25 MG/ML IJ SOLN: 6.2500 mg | INTRAMUSCULAR | Status: DC | PRN

## 2018-08-30 MED ORDER — DEXAMETHASONE SODIUM PHOSPHATE 10 MG/ML IJ SOLN
INTRAMUSCULAR | Status: DC | PRN
  Administered 2018-08-30: 10 mg via INTRAVENOUS

## 2018-08-30 MED ORDER — SCOPOLAMINE 1 MG/3DAYS TD PT72: 1.0000 | MEDICATED_PATCH | Freq: Once | TRANSDERMAL | Status: DC | PRN

## 2018-08-30 MED ORDER — HYDROCODONE-ACETAMINOPHEN 7.5-325 MG PO TABS: 1.0000 | ORAL_TABLET | Freq: Once | ORAL | Status: DC | PRN

## 2018-08-30 MED ORDER — PHENYLEPHRINE 40 MCG/ML (10ML) SYRINGE FOR IV PUSH (FOR BLOOD PRESSURE SUPPORT)
PREFILLED_SYRINGE | INTRAVENOUS | Status: DC | PRN
  Administered 2018-08-30 (×2): 80 ug via INTRAVENOUS

## 2018-08-30 MED ORDER — LACTATED RINGERS IV SOLN
INTRAVENOUS | Status: DC
  Administered 2018-08-30 (×2): via INTRAVENOUS

## 2018-08-30 SURGICAL SUPPLY — 83 items
BANDAGE ACE 3X5.8 VEL STRL LF (GAUZE/BANDAGES/DRESSINGS) ×6 IMPLANT
BANDAGE ACE 4X5 VEL STRL LF (GAUZE/BANDAGES/DRESSINGS) IMPLANT
BANDAGE COBAN STERILE 2 (GAUZE/BANDAGES/DRESSINGS) IMPLANT
BLADE CUDA 2.0 (BLADE) ×3 IMPLANT
BLADE SURG 15 STRL LF DISP TIS (BLADE) ×1 IMPLANT
BLADE SURG 15 STRL SS (BLADE) ×2
BNDG COHESIVE 3X5 TAN STRL LF (GAUZE/BANDAGES/DRESSINGS) IMPLANT
BNDG CONFORM 3 STRL LF (GAUZE/BANDAGES/DRESSINGS) ×3 IMPLANT
BNDG GAUZE ELAST 4 BULKY (GAUZE/BANDAGES/DRESSINGS) ×3 IMPLANT
BRUSH SCRUB EZ PLAIN DRY (MISCELLANEOUS) ×3 IMPLANT
BUR FULL RADIUS 2.0 (BURR) ×2 IMPLANT
BUR FULL RADIUS 2.0MM (BURR) ×1
CLOSURE WOUND 1/2 X4 (GAUZE/BANDAGES/DRESSINGS)
CORD BIPOLAR FORCEPS 12FT (ELECTRODE) ×3 IMPLANT
COVER BACK TABLE 60X90IN (DRAPES) ×3 IMPLANT
COVER WAND RF STERILE (DRAPES) IMPLANT
CUFF TOURNIQUET SINGLE 18IN (TOURNIQUET CUFF) ×3 IMPLANT
DECANTER SPIKE VIAL GLASS SM (MISCELLANEOUS) ×3 IMPLANT
DRAPE EXTREMITY T 121X128X90 (DISPOSABLE) ×3 IMPLANT
DRAPE OEC MINIVIEW 54X84 (DRAPES) IMPLANT
DRAPE SURG 17X23 STRL (DRAPES) ×3 IMPLANT
DRSG EMULSION OIL 3X3 NADH (GAUZE/BANDAGES/DRESSINGS) IMPLANT
GAUZE SPONGE 4X4 12PLY STRL LF (GAUZE/BANDAGES/DRESSINGS) ×3 IMPLANT
GAUZE XEROFORM 1X8 LF (GAUZE/BANDAGES/DRESSINGS) ×3 IMPLANT
GLOVE BIO SURGEON STRL SZ8 (GLOVE) ×3 IMPLANT
GLOVE BIOGEL M STRL SZ7.5 (GLOVE) IMPLANT
GLOVE BIOGEL PI IND STRL 7.0 (GLOVE) ×3 IMPLANT
GLOVE BIOGEL PI INDICATOR 7.0 (GLOVE) ×6
GLOVE ECLIPSE 6.5 STRL STRAW (GLOVE) ×3 IMPLANT
GLOVE SS BIOGEL STRL SZ 8 (GLOVE) ×1 IMPLANT
GLOVE SUPERSENSE BIOGEL SZ 8 (GLOVE) ×2
GLOVE SURG SS PI 6.5 STRL IVOR (GLOVE) ×3 IMPLANT
GOWN STRL REUS W/ TWL LRG LVL3 (GOWN DISPOSABLE) ×2 IMPLANT
GOWN STRL REUS W/TWL LRG LVL3 (GOWN DISPOSABLE) ×4
IV NS IRRIG 3000ML ARTHROMATIC (IV SOLUTION) ×3 IMPLANT
IV SET EXT 30 76VOL 4 MALE LL (IV SETS) ×3 IMPLANT
NDL SUT 6 .5 CRC .975X.05 MAYO (NEEDLE) IMPLANT
NEEDLE HYPO 22GX1.5 SAFETY (NEEDLE) ×3 IMPLANT
NEEDLE HYPO 25X1 1.5 SAFETY (NEEDLE) ×3 IMPLANT
NEEDLE MAYO 6 CRC TAPER PT (NEEDLE) IMPLANT
NEEDLE MAYO TAPER (NEEDLE)
NS IRRIG 1000ML POUR BTL (IV SOLUTION) IMPLANT
PACK BASIN DAY SURGERY FS (CUSTOM PROCEDURE TRAY) ×3 IMPLANT
PAD CAST 3X4 CTTN HI CHSV (CAST SUPPLIES) ×1 IMPLANT
PAD CAST 4YDX4 CTTN HI CHSV (CAST SUPPLIES) ×1 IMPLANT
PADDING CAST ABS 4INX4YD NS (CAST SUPPLIES)
PADDING CAST ABS COTTON 4X4 ST (CAST SUPPLIES) IMPLANT
PADDING CAST COTTON 3X4 STRL (CAST SUPPLIES) ×2
PADDING CAST COTTON 4X4 STRL (CAST SUPPLIES) ×2
PASSER SUT SWANSON 36MM LOOP (INSTRUMENTS) IMPLANT
PROBE BIPOLAR ARTHRO 85MM 30D (MISCELLANEOUS) IMPLANT
SET SM JOINT TUBING/CANN (CANNULA) ×3 IMPLANT
SHEET MEDIUM DRAPE 40X70 STRL (DRAPES) ×3 IMPLANT
SLEEVE SCD COMPRESS KNEE MED (MISCELLANEOUS) ×3 IMPLANT
SLING ARM MED ADULT FOAM STRAP (SOFTGOODS) IMPLANT
SPLINT FAST PLASTER 5X30 (CAST SUPPLIES)
SPLINT FIBERGLASS 3X35 (CAST SUPPLIES) ×3 IMPLANT
SPLINT FIBERGLASS 4X30 (CAST SUPPLIES) IMPLANT
SPLINT PLASTER CAST FAST 5X30 (CAST SUPPLIES) IMPLANT
STOCKINETTE 4X48 STRL (DRAPES) ×3 IMPLANT
STOCKINETTE SYNTHETIC 3 UNSTER (CAST SUPPLIES) ×3 IMPLANT
STRIP CLOSURE SKIN 1/2X4 (GAUZE/BANDAGES/DRESSINGS) IMPLANT
SUT ETHIBOND 3-0 V-5 (SUTURE) IMPLANT
SUT FIBERWIRE 4-0 18 DIAM BLUE (SUTURE)
SUT PDS AB 2-0 CT2 27 (SUTURE) IMPLANT
SUT PROLENE 3 0 PS 2 (SUTURE) IMPLANT
SUT PROLENE 4 0 P 3 18 (SUTURE) IMPLANT
SUT PROLENE 4 0 PS 2 18 (SUTURE) ×3 IMPLANT
SUT VIC AB 3-0 FS2 27 (SUTURE) IMPLANT
SUTURE FIBERWR 4-0 18 DIA BLUE (SUTURE) IMPLANT
SYR BULB 3OZ (MISCELLANEOUS) IMPLANT
SYR CONTROL 10ML LL (SYRINGE) ×3 IMPLANT
TAPE SURG TRANSPORE 1 IN (GAUZE/BANDAGES/DRESSINGS) ×1 IMPLANT
TAPE SURGICAL TRANSPORE 1 IN (GAUZE/BANDAGES/DRESSINGS) ×2
TOWEL GREEN STERILE FF (TOWEL DISPOSABLE) ×3 IMPLANT
TRAP DIGIT (INSTRUMENTS) ×3 IMPLANT
TRAP FINGER LRG (INSTRUMENTS) ×3 IMPLANT
TRAY DSU PREP LF (CUSTOM PROCEDURE TRAY) ×3 IMPLANT
TUBE CONNECTING 20'X1/4 (TUBING) ×1
TUBE CONNECTING 20X1/4 (TUBING) ×2 IMPLANT
UNDERPAD 30X30 (UNDERPADS AND DIAPERS) ×3 IMPLANT
WAND SHORT BEVEL W/CORD (SURGICAL WAND) ×3 IMPLANT
WATER STERILE IRR 1000ML POUR (IV SOLUTION) ×3 IMPLANT

## 2018-08-30 NOTE — Discharge Instructions (Signed)
Keep bandage clean and dry.  Call for any problems.  No smoking.  Criteria for driving a car: you should be off your pain medicine for 7-8 hours, able to drive one handed(confident), thinking clearly and feeling able in your judgement to drive. Continue elevation as it will decrease swelling.  If instructed by MD move your fingers within the confines of the bandage/splint.  Use ice if instructed by your MD. Call immediately for any sudden loss of feeling in your hand/arm or change in functional abilities of the extremity.We recommend that you to take vitamin C 1000 mg a day to promote healing. We also recommend that if you require  pain medicine that you take a stool softener to prevent constipation as most pain medicines will have constipation side effects. We recommend either Peri-Colace or Senokot and recommend that you also consider adding MiraLAX as well to prevent the constipation affects from pain medicine if you are required to use them. These medicines are over the counter and may be purchased at a local pharmacy. A cup of yogurt and a probiotic can also be helpful during the recovery process as the medicines can disrupt your intestinal environment.     Post Anesthesia Home Care Instructions  Activity: Get plenty of rest for the remainder of the day. A responsible individual must stay with you for 24 hours following the procedure.  For the next 24 hours, DO NOT: -Drive a car -Paediatric nurse -Drink alcoholic beverages -Take any medication unless instructed by your physician -Make any legal decisions or sign important papers.  Meals: Start with liquid foods such as gelatin or soup. Progress to regular foods as tolerated. Avoid greasy, spicy, heavy foods. If nausea and/or vomiting occur, drink only clear liquids until the nausea and/or vomiting subsides. Call your physician if vomiting continues.  Special Instructions/Symptoms: Your throat may feel dry or sore from the anesthesia or  the breathing tube placed in your throat during surgery. If this causes discomfort, gargle with warm salt water. The discomfort should disappear within 24 hours.  If you had a scopolamine patch placed behind your ear for the management of post- operative nausea and/or vomiting:  1. The medication in the patch is effective for 72 hours, after which it should be removed.  Wrap patch in a tissue and discard in the trash. Wash hands thoroughly with soap and water. 2. You may remove the patch earlier than 72 hours if you experience unpleasant side effects which may include dry mouth, dizziness or visual disturbances. 3. Avoid touching the patch. Wash your hands with soap and water after contact with the patch.

## 2018-08-30 NOTE — Op Note (Signed)
See dictation#[id number] Amedeo Plenty MD

## 2018-08-30 NOTE — Anesthesia Postprocedure Evaluation (Signed)
Anesthesia Post Note  Patient: RONIN CRAGER  Procedure(s) Performed: WRIST ARTHROSCOPY WITH DEBRIDEMENT AND SYNOVECTOMY (Right Wrist)     Patient location during evaluation: PACU Anesthesia Type: General Level of consciousness: awake and alert and oriented Pain management: pain level controlled Vital Signs Assessment: post-procedure vital signs reviewed and stable Respiratory status: spontaneous breathing, nonlabored ventilation and respiratory function stable Cardiovascular status: blood pressure returned to baseline and stable Postop Assessment: no apparent nausea or vomiting Anesthetic complications: no    Last Vitals:  Vitals:   08/30/18 1457 08/30/18 1509  BP: (!) 123/55 (!) 135/58  Pulse: 71 75  Resp: 18 16  Temp:  36.6 C  SpO2: 95% 100%    Last Pain:  Vitals:   08/30/18 1509  TempSrc: Oral  PainSc: 2                  Seleen Walter A.

## 2018-08-30 NOTE — Anesthesia Procedure Notes (Signed)
Procedure Name: LMA Insertion Date/Time: 08/30/2018 12:44 PM Performed by: Maryella Shivers, CRNA Pre-anesthesia Checklist: Patient identified, Emergency Drugs available, Suction available and Patient being monitored Patient Re-evaluated:Patient Re-evaluated prior to induction Oxygen Delivery Method: Circle system utilized Preoxygenation: Pre-oxygenation with 100% oxygen Induction Type: IV induction Ventilation: Mask ventilation without difficulty LMA: LMA inserted LMA Size: 4.0 Number of attempts: 1 Airway Equipment and Method: Bite block Placement Confirmation: positive ETCO2 Tube secured with: Tape Dental Injury: Teeth and Oropharynx as per pre-operative assessment

## 2018-08-30 NOTE — Anesthesia Preprocedure Evaluation (Addendum)
Anesthesia Evaluation  Patient identified by MRN, date of birth, ID band Patient awake    Reviewed: Allergy & Precautions, NPO status , Patient's Chart, lab work & pertinent test results  Airway Mallampati: II  TM Distance: >3 FB Neck ROM: Full    Dental no notable dental hx. (+) Teeth Intact   Pulmonary sleep apnea and Continuous Positive Airway Pressure Ventilation ,    Pulmonary exam normal breath sounds clear to auscultation       Cardiovascular hypertension, Pt. on medications Normal cardiovascular exam+ dysrhythmias + Valvular Problems/Murmurs  Rhythm:Regular Rate:Normal  EKG- NSR prolonged QTc  Inverted T waves V5-6, I, aVL II   Neuro/Psych PSYCHIATRIC DISORDERS Anxiety Depression negative neurological ROS     GI/Hepatic Neg liver ROS, GERD  Medicated and Controlled,  Endo/Other  diabetes, Well ControlledHypercholesterolemia Obesity Enlarged thyroid  Renal/GU Renal InsufficiencyRenal disease  negative genitourinary   Musculoskeletal Chronic right wrist pain   Abdominal (+) + obese,   Peds  Hematology negative hematology ROS (+)   Anesthesia Other Findings   Reproductive/Obstetrics                            Anesthesia Physical Anesthesia Plan  ASA: III  Anesthesia Plan: General   Post-op Pain Management:    Induction: Intravenous  PONV Risk Score and Plan: 4 or greater and Ondansetron, Dexamethasone and Treatment may vary due to age or medical condition  Airway Management Planned: LMA  Additional Equipment:   Intra-op Plan:   Post-operative Plan: Extubation in OR  Informed Consent: I have reviewed the patients History and Physical, chart, labs and discussed the procedure including the risks, benefits and alternatives for the proposed anesthesia with the patient or authorized representative who has indicated his/her understanding and acceptance.     Dental advisory  given  Plan Discussed with: CRNA and Surgeon  Anesthesia Plan Comments:         Anesthesia Quick Evaluation
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
## 2018-08-30 NOTE — H&P (Signed)
Miranda Moore is an 58 y.o. female.   Chief Complaint: Right wrist pain HPI: Patient presents for arthroscopic evaluation and treatment right wrist secondary to chronic pain after an on-the-job injury.  This is well detailed in my office notes.  Patient presents for evaluation and treatment of the of their upper extremity predicament. The patient denies neck, back, chest or  abdominal pain. The patient notes that they have no lower extremity problems. The patients primary complaint is noted. We are planning surgical care pathway for the upper extremity.  Past Medical History:  Diagnosis Date  . Anxiety   . Depression   . GERD (gastroesophageal reflux disease)   . Hypertension   . MRSA infection 2007   left lower leg  . Pre-diabetes   . Sleep apnea    uses CPAP nightly    Past Surgical History:  Procedure Laterality Date  . ABDOMINAL HYSTERECTOMY  2002  . APPENDECTOMY  2013   laparascopic with removal of tubal remnant  . COLONOSCOPY  2015  . KNEE SURGERY  2012  . LAPAROSCOPIC OOPHORECTOMY  2012    Family History  Problem Relation Age of Onset  . Cancer Mother        colon  . Heart failure Mother   . Heart disease Father   . Hypertension Father   . Hypertension Brother   . Heart failure Paternal Grandmother   . Dementia Paternal Grandfather   . Breast cancer Paternal Aunt    Social History:  reports that she has never smoked. She has never used smokeless tobacco. She reports current alcohol use. She reports that she does not use drugs.  Allergies:  Allergies  Allergen Reactions  . Chlorpheniramine-Phenylephrine Hives  . Other Hives    actifed  . Sulfa Antibiotics   . Sulfur Other (See Comments)    Low b/p    Medications Prior to Admission  Medication Sig Dispense Refill  . ALPRAZolam (XANAX) 0.5 MG tablet TAKE 1-2 TABLETS BY MOUTH 3 TIMES DAILY PRN FOR ANXIETY STRESS 90 tablet 1  . amLODIPine-Valsartan-HCTZ 10-160-12.5 MG TABS TAKE 1 TABLET DAILY FOR     HYPERTENSION 90 tablet 3  . aspirin 81 MG tablet Take 81 mg by mouth daily.    . Calcium Carbonate-Vitamin D 600-200 MG-UNIT TABS     . Cholecalciferol (VITAMIN D-3) 1000 UNITS CAPS Take by mouth daily.    . furosemide (LASIX) 20 MG tablet TAKE 1 TABLET DAILY AS     NEEDED FOR EDEMA 90 tablet 1  . Omega-3 Fatty Acids (FISH OIL DOUBLE STRENGTH) 1200 MG CAPS Take by mouth.    Marland Kitchen omeprazole (PRILOSEC) 40 MG capsule TAKE 1 CAPSULE DAILY FOR   REFLUX ESOPHAGITIS WITH    GASTRITIS 90 capsule 3  . POTASSIUM CITRATE PO Take by mouth daily.    . sertraline (ZOLOFT) 50 MG tablet TAKE 1 TABLET DAILY 90 tablet 3    No results found for this or any previous visit (from the past 48 hour(s)). No results found.  ROS  Blood pressure 128/67, pulse 65, temperature 97.8 F (36.6 C), temperature source Oral, resp. rate 20, height 5\' 9"  (1.753 m), weight 113.3 kg, SpO2 100 %. Physical Exam  Chronic right wrist pain with failure of conservative management.  Patient desires to proceed with arthroscopic evaluation and treatment.  The patient is alert and oriented in no acute distress. The patient complains of pain in the affected upper extremity.  The patient is noted to have a normal  HEENT exam. Lung fields show equal chest expansion and no shortness of breath. Abdomen exam is nontender without distention. Lower extremity examination does not show any fracture dislocation or blood clot symptoms. Pelvis is stable and the neck and back are stable and nontender. Assessment/Plan We will plan for arthroscopic evaluation right wrist with repair reconstruction is necessary based upon the intraoperative findings.  We are planning surgery for your upper extremity. The risk and benefits of surgery to include risk of bleeding, infection, anesthesia,  damage to normal structures and failure of the surgery to accomplish its intended goals of relieving symptoms and restoring function have been discussed in detail. With this  in mind we plan to proceed. I have specifically discussed with the patient the pre-and postoperative regime and the dos and don'ts and risk and benefits in great detail. Risk and benefits of surgery also include risk of dystrophy(CRPS), chronic nerve pain, failure of the healing process to go onto completion and other inherent risks of surgery The relavent the pathophysiology of the disease/injury process, as well as the alternatives for treatment and postoperative course of action has been discussed in great detail with the patient who desires to proceed.  We will do everything in our power to help you (the patient) restore function to the upper extremity. It is a pleasure to see this patient today.   Willa Frater III, MD 08/30/2018, 12:31 PM

## 2018-08-30 NOTE — Transfer of Care (Signed)
Immediate Anesthesia Transfer of Care Note  Patient: Miranda Moore  Procedure(s) Performed: WRIST ARTHROSCOPY WITH DEBRIDEMENT AND SYNOVECTOMY (Right Wrist)  Patient Location: PACU  Anesthesia Type:General  Level of Consciousness: awake, alert  and oriented  Airway & Oxygen Therapy: Patient Spontanous Breathing and Patient connected to face mask oxygen  Post-op Assessment: Report given to RN and Post -op Vital signs reviewed and stable  Post vital signs: Reviewed and stable  Last Vitals:  Vitals Value Taken Time  BP 133/60   Temp    Pulse 70   Resp 13   SpO2 96%%     Last Pain:  Vitals:   08/30/18 1114  TempSrc: Oral  PainSc: 8       Patients Stated Pain Goal: 3 (61/44/31 5400)  Complications: No apparent anesthesia complications

## 2018-08-31 ENCOUNTER — Encounter (HOSPITAL_BASED_OUTPATIENT_CLINIC_OR_DEPARTMENT_OTHER): Payer: Self-pay | Admitting: Orthopedic Surgery

## 2018-09-13 DIAGNOSIS — Z5189 Encounter for other specified aftercare: Secondary | ICD-10-CM | POA: Insufficient documentation

## 2018-09-13 DIAGNOSIS — S63599A Other specified sprain of unspecified wrist, initial encounter: Secondary | ICD-10-CM | POA: Insufficient documentation

## 2018-09-14 ENCOUNTER — Other Ambulatory Visit: Payer: Self-pay | Admitting: Family Medicine

## 2018-09-14 ENCOUNTER — Ambulatory Visit: Payer: PRIVATE HEALTH INSURANCE | Attending: Orthopedic Surgery | Admitting: Occupational Therapy

## 2018-09-14 ENCOUNTER — Encounter: Payer: Self-pay | Admitting: Occupational Therapy

## 2018-09-14 ENCOUNTER — Other Ambulatory Visit: Payer: Self-pay

## 2018-09-14 DIAGNOSIS — M6281 Muscle weakness (generalized): Secondary | ICD-10-CM | POA: Insufficient documentation

## 2018-09-14 DIAGNOSIS — S6981XA Other specified injuries of right wrist, hand and finger(s), initial encounter: Secondary | ICD-10-CM | POA: Insufficient documentation

## 2018-09-14 DIAGNOSIS — M25531 Pain in right wrist: Secondary | ICD-10-CM | POA: Diagnosis not present

## 2018-09-14 DIAGNOSIS — M25631 Stiffness of right wrist, not elsewhere classified: Secondary | ICD-10-CM | POA: Diagnosis present

## 2018-09-14 DIAGNOSIS — R278 Other lack of coordination: Secondary | ICD-10-CM | POA: Diagnosis present

## 2018-09-14 NOTE — Patient Instructions (Signed)
Heat AROM for wrist in all planes But pain free - slight pull or stretch less than 1-2/10 Ice if needed   splint to wear per surgeon order- another 2 wks when working and strap around wrist

## 2018-09-14 NOTE — Therapy (Signed)
[facility] [address], [place], [address] Phone: [phone]   Fax:  [phone]  Occupational Therapy Evaluation  Patient Details  Name: Miranda Moore MRN: [id number] Date of Birth: [birth year] No data recorded  Encounter Date: 09/14/2018  OT End of Session - 09/14/18 1652    Visit Number  1    Number of Visits  16    Date for OT Re-Evaluation  11/09/18    Authorization Type  workers compensation    Authorization Time Period  8    OT Start Time  1314    OT Stop Time  1358    OT Time Calculation (min)  44 min    Activity Tolerance  Patient tolerated treatment well    Behavior During Therapy  [facility] for tasks assessed/performed       Past Medical History:  Diagnosis Date  . Anxiety   . Depression   . GERD (gastroesophageal reflux disease)   . Hypertension   . MRSA infection 2007   left lower leg  . Pre-diabetes   . Sleep apnea    uses CPAP nightly    Past Surgical History:  Procedure Laterality Date  . ABDOMINAL HYSTERECTOMY  2002  . APPENDECTOMY  2013   laparascopic with removal of tubal remnant  . COLONOSCOPY  2015  . KNEE SURGERY  2012  . LAPAROSCOPIC OOPHORECTOMY  2012  . WRIST ARTHROSCOPY WITH DEBRIDEMENT Right 08/30/2018   Procedure: WRIST ARTHROSCOPY WITH DEBRIDEMENT AND SYNOVECTOMY;  Surgeon: Roseanne Kaufman, MD;  Location: [place];  Service: Orthopedics;  Laterality: Right;    There were no vitals filed for this visit.  Subjective Assessment - 09/14/18 1642    Subjective   I had my wrist injury on 10/11/17 - and had MRI, shots and therapy with no relieve - and then I went to see Dr Amedeo Plenty and had surgery 08/30/18- seen him this am - took my stitches out - and pain is better - I have just soreness at rest -and then wrist are stiff - suppose to wear my splint for another 2 wks at work and this strap around my wrist     Limitations  Patient current restrictions include no  lifting, pushing, pulling greater than 2-3# at work and at home- and splint for 2 wks     Patient Stated Goals  I want to be able to get my wrist ROM and strength back to use it like before without pain     Currently in Pain?  Yes    Pain Score  0-No pain    Pain Location  Wrist    Pain Orientation  Right    Pain Descriptors / Indicators  Sore    Pain Type  Surgical pain    Pain Onset  1 to 4 weeks ago        [facility] OT Assessment - 09/14/18 0001      Assessment   Medical Diagnosis  TFCC debridement    Hand Dominance  Right    Next MD Visit  08/30/18    Prior Therapy  --   Had OT last year      Precautions   Precaution Comments  not more than 2-3lbs    Required Braces or Orthoses  --   brace for another 2 wks , and strap around the wrist     Home  Environment   Lives With  Spouse  Prior Function   Vocation  Full time employment    Leisure  work as Chartered certified accountant, shop, on table or phone       AROM   Right Forearm Pronation  90 Degrees    Right Forearm Supination  75 Degrees    Left Wrist Extension  54 Degrees    Left Wrist Flexion  48 Degrees   38 with loose fist   Left Wrist Radial Deviation  15 Degrees   pain 4/10    Left Wrist Ulnar Deviation  27 Degrees      fluidotherapy done with AROM of wrist in all planes - pain free  Review HEP - hand out provided    Heat AROM for wrist in all planes But pain free - slight pull or stretch less than 1-2/10 Ice if needed   splint to wear per surgeon order- another 2 wks when working and strap around wrist                OT Education - 09/14/18 1652    Education Details  Findings of eval and HEP     Person(s) Educated  Patient    Methods  Explanation;Demonstration;Handout    Comprehension  Verbalized understanding;Returned demonstration       OT Short Term Goals - 09/14/18 1707      OT SHORT TERM GOAL #1   Title  Pain on PRWHE improve to less than 8/50 with use in ADL's and IADL's     Baseline  soreness  at rest -and pain score on PRWHE 24/50 - was in splint up to now     Time  4    Period  Weeks    Status  New    Target Date  10/12/18      OT SHORT TERM GOAL #2   Title  Pt to be ind in HEP to increase AROM in wrist to [facility] without increase symptoms     Baseline  AROM decrease in all planes with flexion, ext, RD, sup worse and pain increased to 4/10 with RD     Time  4    Period  Weeks    Status  New    Target Date  10/12/18        OT Long Term Goals - 09/14/18 1709      OT LONG TERM GOAL #1   Title  R wrist strength increase to 4+/5 to be able to lift , pull , push more than  5 lbs without symptoms     Baseline  no strenghtening yet     Time  8    Period  Weeks    Status  New    Target Date  11/09/18      OT LONG TERM GOAL #2   Title  Grip and prehension  strength in the R increase to more than 50% compare to L to return to prior level of function and no increase symptoms     Baseline  NT - 2 wks s/p     Time  8    Period  Weeks    Status  New    Target Date  11/09/18      OT LONG TERM GOAL #3   Title  Function score on PRWHE improve wiht more than 25 points     Baseline  at eval PRWHE function score 46/50     Time  8    Period  Weeks    Status  New  Target Date  11/09/18            Plan - 09/14/18 1653    Clinical Impression Statement  Pt is 2 wks s/p R TFCC debridement , pt seen surgeon this am to remove stitches -and pt ed on scar massage to start in 2-3 days - and pt report no pain at rest - only soreness - pain much better than prior to surgery- pt show decrease wrist ROM in all planes - fisting and opposition WNL - per pt to wear wrist prefab splint for another 2 wks at work or when out and about - and then strap around wrist - decrease ROM , strength limiting her functional use of R dominant hand /wrist     Occupational performance deficits (Please refer to evaluation for details):  ADL's;IADL's;Work;Leisure    Rehab Potential  Good    Current  Impairments/barriers affecting progress:  length of time since injury, pain, hypersensitivity to right wrist and hand- since march 19    OT Frequency  --   1-2 x wk   OT Duration  4 weeks    OT Treatment/Interventions  Self-care/ADL training;Therapeutic exercise;Moist Heat;Paraffin;Splinting;Patient/family education;Fluidtherapy;Contrast Bath;Manual Therapy;Passive range of motion    Plan  assess progress and modify HEP     Clinical Decision Making  Multiple treatment options, significant modification of task necessary    OT Home Exercise Plan  see pt instruction    Consulted and Agree with Plan of Care  Patient       Patient will benefit from skilled therapeutic intervention in order to improve the following deficits and impairments:  Increased edema, Impaired flexibility, Pain, Decreased range of motion, Decreased strength, Impaired UE functional use  Visit Diagnosis: Pain in right wrist - Plan: Ot plan of care cert/re-cert  Stiffness of right wrist joint - Plan: Ot plan of care cert/re-cert  Muscle weakness (generalized) - Plan: Ot plan of care cert/re-cert  TFCC (triangular fibrocartilage complex) injury, right, initial encounter - Plan: Ot plan of care cert/re-cert    Problem List Patient Active Problem List   Diagnosis Date Noted  . Abscess of axilla, right 09/09/2015  . Abortion, spontaneous 05/31/2015  . Edema leg 05/31/2015  . Chest discomfort 05/31/2015  . Cough 05/31/2015  . Diabetes mellitus ([place]) 05/31/2015  . Accumulation of fluid in tissues 05/31/2015  . Big thyroid 05/31/2015  . Cardiac murmur 05/31/2015  . Hot flash, menopausal 05/31/2015  . LBP (low back pain) 05/31/2015  . Acute thoracic back pain 05/31/2015  . Multinodular goiter 05/31/2015  . Adiposity 05/31/2015  . Abnormal blood sugar 05/31/2015  . Cutaneous eruption 05/31/2015  . Avitaminosis D 05/31/2015  . Anxiety 02/15/2015  . Accessory skin tags 11/29/2014  . Esophagitis, reflux 04/27/2008   . Other general symptoms and signs 10/05/2006  . Pure hypercholesterolemia 09/29/2006  . 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 Acute stress disorder 02/18/2006  . 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 Bone/cartilage disorder 07/29/2005  . Benign essential HTN 07/10/2005    Rosalyn Gess OTR/L,CLT 09/14/2018, 5:15 PM  [facility] [address] [place], [address] Phone: [phone]   Fax:  [phone]  Name: Miranda Moore MRN: [id number] Date of Birth: 05/27/1961

## 2018-09-20 ENCOUNTER — Ambulatory Visit: Payer: PRIVATE HEALTH INSURANCE | Admitting: Occupational Therapy

## 2018-09-20 ENCOUNTER — Encounter: Payer: Self-pay | Admitting: Occupational Therapy

## 2018-09-20 DIAGNOSIS — M25531 Pain in right wrist: Secondary | ICD-10-CM | POA: Diagnosis not present

## 2018-09-20 DIAGNOSIS — S6981XA Other specified injuries of right wrist, hand and finger(s), initial encounter: Secondary | ICD-10-CM

## 2018-09-20 DIAGNOSIS — M6281 Muscle weakness (generalized): Secondary | ICD-10-CM

## 2018-09-20 DIAGNOSIS — M25631 Stiffness of right wrist, not elsewhere classified: Secondary | ICD-10-CM
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
## 2018-09-23 ENCOUNTER — Ambulatory Visit: Payer: PRIVATE HEALTH INSURANCE | Admitting: Occupational Therapy

## 2018-09-23 DIAGNOSIS — M25631 Stiffness of right wrist, not elsewhere classified: Secondary | ICD-10-CM

## 2018-09-23 DIAGNOSIS — M25531 Pain in right wrist: Secondary | ICD-10-CM | POA: Diagnosis not present

## 2018-09-23 DIAGNOSIS — M6281 Muscle weakness (generalized): Secondary | ICD-10-CM

## 2018-09-23 DIAGNOSIS — R278 Other lack of coordination: Secondary | ICD-10-CM

## 2018-09-23 DIAGNOSIS — S6981XA Other specified injuries of right wrist, hand and finger(s), initial encounter: Secondary | ICD-10-CM

## 2018-09-23 NOTE — Patient Instructions (Signed)
Add AAROM for wrist flexion , ext, RD ,UD - 10 reps  hold 3 sec pain free prior to AROM  And to also do supination - loose fist  And cont with splint wearing per MD order

## 2018-09-23 NOTE — Therapy (Signed)
[facility] [address], [place], [address] Phone: [phone]   Fax:  [phone]  Occupational Therapy Treatment  Patient Details  Name: Miranda Moore MRN: [id number] Date of Birth: 08-Oct-1960 No data recorded  Encounter Date: 09/23/2018  OT End of Session - 09/23/18 1204    Visit Number  3    Number of Visits  16    Date for OT Re-Evaluation  11/09/18    Authorization Type  workers compensation    OT Start Time  0915    OT Stop Time  0951    OT Time Calculation (min)  36 min    Activity Tolerance  Patient tolerated treatment well    Behavior During Therapy  [facility] for tasks assessed/performed       Past Medical History:  Diagnosis Date  . Anxiety   . Depression   . GERD (gastroesophageal reflux disease)   . Hypertension   . MRSA infection 2007   left lower leg  . Pre-diabetes   . Sleep apnea    uses CPAP nightly    Past Surgical History:  Procedure Laterality Date  . ABDOMINAL HYSTERECTOMY  2002  . APPENDECTOMY  2013   laparascopic with removal of tubal remnant  . COLONOSCOPY  2015  . KNEE SURGERY  2012  . LAPAROSCOPIC OOPHORECTOMY  2012  . WRIST ARTHROSCOPY WITH DEBRIDEMENT Right 08/30/2018   Procedure: WRIST ARTHROSCOPY WITH DEBRIDEMENT AND SYNOVECTOMY;  Surgeon: Roseanne Kaufman, MD;  Location: [place];  Service: Orthopedics;  Laterality: Right;    There were no vitals filed for this visit.  Subjective Assessment - 09/23/18 0934    Subjective   Only sligth pull with exercises and coming in just more soreness - using it more     Limitations  Patient current restrictions include no lifting, pushing, pulling greater than 2-3# at work and at home- and splint for 2 wks     Patient Stated Goals  I want to be able to get my wrist ROM and strength back to use it like before without pain     Currently in Pain?  Yes    Pain Score  1     Pain Location  Wrist    Pain Orientation   Right    Pain Descriptors / Indicators  Tender    Pain Type  Surgical pain    Pain Onset  1 to 4 weeks ago    Pain Frequency  Constant         [facility] OT Assessment - 09/23/18 0001      AROM   Right Forearm Supination  80 Degrees    Right Wrist Extension  65 Degrees    Right Wrist Flexion  72 Degrees   50   Right Wrist Radial Deviation  15 Degrees    Right Wrist Ulnar Deviation  32 Degrees      assess AROM prior to fluido           OT Treatments/Exercises (OP) - 09/23/18 0001      Cryotherapy   Number Minutes Cryotherapy  2 Minutes    Cryotherapy Location  Wrist    Type of Cryotherapy  Ice pack      RUE Fluidotherapy   Number Minutes Fluidotherapy  8 Minutes    RUE Fluidotherapy Location  Hand;Wrist    Comments  AROM for wrist in all planes  - pain free      add afterwards -  AAROM over edge of table for flexion , ext, RD, UD - 10 reps  pain free  And then followed by AROM - same that she was doing at home   great progress and even again after AAROM in flexion of wrist  cont with splint - and pain free ROM        OT Education - 09/23/18 1201    Education Details  add and review AAROM     Person(s) Educated  Patient    Methods  Explanation;Demonstration;Handout    Comprehension  Verbalized understanding;Returned demonstration       OT Short Term Goals - 09/14/18 1707      OT SHORT TERM GOAL #1   Title  Pain on PRWHE improve to less than 8/50 with use in ADL's and IADL's     Baseline  soreness at rest -and pain score on PRWHE 24/50 - was in splint up to now     Time  4    Period  Weeks    Status  New    Target Date  10/12/18      OT SHORT TERM GOAL #2   Title  Pt to be ind in HEP to increase AROM in wrist to [facility] without increase symptoms     Baseline  AROM decrease in all planes with flexion, ext, RD, sup worse and pain increased to 4/10 with RD     Time  4    Period  Weeks    Status  New    Target Date  10/12/18        OT Long Term Goals  - 09/14/18 1709      OT LONG TERM GOAL #1   Title  R wrist strength increase to 4+/5 to be able to lift , pull , push more than  5 lbs without symptoms     Baseline  no strenghtening yet     Time  8    Period  Weeks    Status  New    Target Date  11/09/18      OT LONG TERM GOAL #2   Title  Grip and prehension  strength in the R increase to more than 50% compare to L to return to prior level of function and no increase symptoms     Baseline  NT - 2 wks s/p     Time  8    Period  Weeks    Status  New    Target Date  11/09/18      OT LONG TERM GOAL #3   Title  Function score on PRWHE improve wiht more than 25 points     Baseline  at eval PRWHE function score 46/50     Time  8    Period  Weeks    Status  New    Target Date  11/09/18            Plan - 09/23/18 1223    Clinical Impression Statement  Pt is making great progress and pain still less than 1-2/10 during HEP and only soreness - do wear her splint still - did add pain free AAROM for RD,UD and flexion ,extention this date - to cont with those - pt is 3 1/2 wks s/p     Occupational performance deficits (Please refer to evaluation for details):  ADL's;IADL's;Work;Leisure    Rehab Potential  Good    Current Impairments/barriers affecting progress:  length of time since injury, pain, hypersensitivity to right  wrist and hand- since march 19    OT Frequency  --   1-2 x wk   OT Duration  4 weeks    OT Treatment/Interventions  Self-care/ADL training;Therapeutic exercise;Moist Heat;Paraffin;Splinting;Patient/family education;Fluidtherapy;Contrast Bath;Manual Therapy;Passive range of motion    Plan  assess progress with AAROM     Clinical Decision Making  Multiple treatment options, significant modification of task necessary    OT Home Exercise Plan  see pt instruction    Consulted and Agree with Plan of Care  Patient       Patient will benefit from skilled therapeutic intervention in order to improve the following deficits  and impairments:  Increased edema, Impaired flexibility, Pain, Decreased range of motion, Decreased strength, Impaired UE functional use  Visit Diagnosis: Pain in right wrist  Stiffness of right wrist joint  Muscle weakness (generalized)  TFCC (triangular fibrocartilage complex) injury, right, initial encounter  Other lack of coordination    Problem List Patient Active Problem List   Diagnosis Date Noted  . Abscess of axilla, right 09/09/2015  . Abortion, spontaneous 05/31/2015  . Edema leg 05/31/2015  . Chest discomfort 05/31/2015  . Cough 05/31/2015  . Diabetes mellitus ([place]) 05/31/2015  . Accumulation of fluid in tissues 05/31/2015  . Big thyroid 05/31/2015  . Cardiac murmur 05/31/2015  . Hot flash, menopausal 05/31/2015  . LBP (low back pain) 05/31/2015  . Acute thoracic back pain 05/31/2015  . Multinodular goiter 05/31/2015  . Adiposity 05/31/2015  . Abnormal blood sugar 05/31/2015  . Cutaneous eruption 05/31/2015  . Avitaminosis D 05/31/2015  . Anxiety 02/15/2015  . Accessory skin tags 11/29/2014  . Esophagitis, reflux 04/27/2008  . Other general symptoms and signs 10/05/2006  . Pure hypercholesterolemia 09/29/2006  . 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 Acute stress disorder 02/18/2006  . 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 Bone/cartilage disorder 07/29/2005  . Benign essential HTN 07/10/2005    Rosalyn Gess OTR/L,CLT 09/23/2018, 3:11 PM  [facility] [address], [place], [address] Phone: [phone]   Fax:  [phone]  Name: Miranda Moore MRN: [id number] Date of Birth: [birth year]

## 2018-09-27 ENCOUNTER — Ambulatory Visit: Payer: PRIVATE HEALTH INSURANCE | Attending: Orthopedic Surgery | Admitting: Occupational Therapy

## 2018-09-27 DIAGNOSIS — M25631 Stiffness of right wrist, not elsewhere classified: Secondary | ICD-10-CM | POA: Diagnosis present

## 2018-09-27 DIAGNOSIS — M25531 Pain in right wrist: Secondary | ICD-10-CM | POA: Insufficient documentation

## 2018-09-27 DIAGNOSIS — M6281 Muscle weakness (generalized): Secondary | ICD-10-CM | POA: Diagnosis present

## 2018-09-27 DIAGNOSIS — S6981XA Other specified injuries of right wrist, hand and finger(s), initial encounter: Secondary | ICD-10-CM | POA: Insufficient documentation

## 2018-09-27 DIAGNOSIS — R278 Other lack of coordination: Secondary | ICD-10-CM | POA: Insufficient documentation

## 2018-09-27 NOTE — Therapy (Signed)
[facility] [address], [place], [address] Phone: [phone]   Fax:  [phone]  Occupational Therapy Treatment  Patient Details  Name: Miranda Moore MRN: [id number] Date of Birth: 1960/09/29 No data recorded  Encounter Date: 09/27/2018  OT End of Session - 09/27/18 1301    Visit Number  4    Number of Visits  16    Date for OT Re-Evaluation  11/09/18    Authorization Type  workers compensation    Authorization Time Period  8    OT Start Time  1220    OT Stop Time  1250    OT Time Calculation (min)  30 min    Activity Tolerance  Patient tolerated treatment well    Behavior During Therapy  [facility] for tasks assessed/performed       Past Medical History:  Diagnosis Date  . Anxiety   . Depression   . GERD (gastroesophageal reflux disease)   . Hypertension   . MRSA infection 2007   left lower leg  . Pre-diabetes   . Sleep apnea    uses CPAP nightly    Past Surgical History:  Procedure Laterality Date  . ABDOMINAL HYSTERECTOMY  2002  . APPENDECTOMY  2013   laparascopic with removal of tubal remnant  . COLONOSCOPY  2015  . KNEE SURGERY  2012  . LAPAROSCOPIC OOPHORECTOMY  2012  . WRIST ARTHROSCOPY WITH DEBRIDEMENT Right 08/30/2018   Procedure: WRIST ARTHROSCOPY WITH DEBRIDEMENT AND SYNOVECTOMY;  Surgeon: Roseanne Kaufman, MD;  Location: [place];  Service: Orthopedics;  Laterality: Right;    There were no vitals filed for this visit.  Subjective Assessment - 09/27/18 1259    Subjective   Doing okay- just some soreness and if I work long hours and wear wrist splint - it feels stiff- but other wise very happy with progress     Patient Stated Goals  I want to be able to get my wrist ROM and strength back to use it like before without pain     Currently in Pain?  No/denies         [facility] OT Assessment - 09/27/18 0001      AROM   Right Forearm Supination  85 Degrees    Right  Wrist Extension  72 Degrees    Right Wrist Flexion  80 Degrees   hand in fist 60   Right Wrist Radial Deviation  15 Degrees    Right Wrist Ulnar Deviation  30 Degrees       assess AROM for wrist in all planes         OT Treatments/Exercises (OP) - 09/27/18 0001      Cryotherapy   Number Minutes Cryotherapy  3 Minutes    Cryotherapy Location  Wrist    Type of Cryotherapy  Ice pack      RUE Fluidotherapy   Number Minutes Fluidotherapy  8 Minutes    RUE Fluidotherapy Location  Hand;Wrist    Comments  AROM in all planes        AAROM over edge of table for flexion , ext, RD, UD - 10 reps review - no issues   pain free  And then followed by AROM - same that she was doing at home   great progress and even again after AAROM in flexion of wrist  cont with splint per surgeon order  - and pain free ROM     But  add isometric strength in neutral position - for flexion, Ext, RD, UD  12 reps   and can increase if pain free to 2 sets 3 days , then 3 sets in 6 days  Ice at end        OT Education - 09/27/18 1301    Education Details   review AAROM  and add isometric     Person(s) Educated  Patient    Methods  Explanation;Demonstration;Handout    Comprehension  Verbalized understanding;Returned demonstration       OT Short Term Goals - 09/14/18 1707      OT SHORT TERM GOAL #1   Title  Pain on PRWHE improve to less than 8/50 with use in ADL's and IADL's     Baseline  soreness at rest -and pain score on PRWHE 24/50 - was in splint up to now     Time  4    Period  Weeks    Status  New    Target Date  10/12/18      OT SHORT TERM GOAL #2   Title  Pt to be ind in HEP to increase AROM in wrist to [facility] without increase symptoms     Baseline  AROM decrease in all planes with flexion, ext, RD, sup worse and pain increased to 4/10 with RD     Time  4    Period  Weeks    Status  New    Target Date  10/12/18        OT Long Term Goals - 09/14/18 1709      OT LONG TERM GOAL  #1   Title  R wrist strength increase to 4+/5 to be able to lift , pull , push more than  5 lbs without symptoms     Baseline  no strenghtening yet     Time  8    Period  Weeks    Status  New    Target Date  11/09/18      OT LONG TERM GOAL #2   Title  Grip and prehension  strength in the R increase to more than 50% compare to L to return to prior level of function and no increase symptoms     Baseline  NT - 2 wks s/p     Time  8    Period  Weeks    Status  New    Target Date  11/09/18      OT LONG TERM GOAL #3   Title  Function score on PRWHE improve wiht more than 25 points     Baseline  at eval PRWHE function score 46/50     Time  8    Period  Weeks    Status  New    Target Date  11/09/18            Plan - 09/27/18 1302    Clinical Impression Statement  Pt is 4 wks s/o TFCC surgery - pt making great progress- every time with AROM - did add last time AAROM and this date add isometric for neutral position for wrist - no pain - pt can gradually increase sets the next week -and will reassess and upgrade next week     Occupational performance deficits (Please refer to evaluation for details):  ADL's;IADL's;Work;Leisure    Rehab Potential  Good    Current Impairments/barriers affecting progress:  length of time since injury, pain, hypersensitivity to right wrist and hand- since march 19  OT Frequency  1x / week    OT Duration  4 weeks    OT Treatment/Interventions  Self-care/ADL training;Therapeutic exercise;Moist Heat;Paraffin;Splinting;Patient/family education;Fluidtherapy;Contrast Bath;Manual Therapy;Passive range of motion    Plan  assess progress with AAROM and isometric    Clinical Decision Making  Multiple treatment options, significant modification of task necessary    OT Home Exercise Plan  see pt instruction    Consulted and Agree with Plan of Care  Patient       Patient will benefit from skilled therapeutic intervention in order to improve the following deficits  and impairments:  Increased edema, Impaired flexibility, Pain, Decreased range of motion, Decreased strength, Impaired UE functional use  Visit Diagnosis: Pain in right wrist  Stiffness of right wrist joint  Muscle weakness (generalized)  TFCC (triangular fibrocartilage complex) injury, right, initial encounter  Other lack of coordination    Problem List Patient Active Problem List   Diagnosis Date Noted  . Abscess of axilla, right 09/09/2015  . Abortion, spontaneous 05/31/2015  . Edema leg 05/31/2015  . Chest discomfort 05/31/2015  . Cough 05/31/2015  . Diabetes mellitus ([employer]) 05/31/2015  . Accumulation of fluid in tissues 05/31/2015  . Big thyroid 05/31/2015  . Cardiac murmur 05/31/2015  . Hot flash, menopausal 05/31/2015  . LBP (low back pain) 05/31/2015  . Acute thoracic back pain 05/31/2015  . Multinodular goiter 05/31/2015  . Adiposity 05/31/2015  . Abnormal blood sugar 05/31/2015  . Cutaneous eruption 05/31/2015  . Avitaminosis D 05/31/2015  . Anxiety 02/15/2015  . Accessory skin tags 11/29/2014  . Esophagitis, reflux 04/27/2008  . Other general symptoms and signs 10/05/2006  . Pure hypercholesterolemia 09/29/2006  . 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 Acute stress disorder 02/18/2006  . 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 Bone/cartilage disorder 07/29/2005  . Benign essential HTN 07/10/2005    Rosalyn Gess OTR/L,CLT 09/27/2018, 1:04 PM  [facility] [address] [place], [address] Phone: [phone]   Fax:  [phone]  Name: BRECKLYN GALVIS MRN: [id number] Date of Birth: 03/15/1961

## 2018-09-27 NOTE — Patient Instructions (Signed)
Same HEP  But add isometric strength in neutral position - for flexion, Ext, RD, UD  12 reps   and can increase if pain free to 2 sets 3 days , then 3 sets in 6 days

## 2018-10-04 ENCOUNTER — Ambulatory Visit: Payer: PRIVATE HEALTH INSURANCE | Admitting: Occupational Therapy

## 2018-10-04 DIAGNOSIS — M6281 Muscle weakness (generalized): Secondary | ICD-10-CM

## 2018-10-04 DIAGNOSIS — S6981XA Other specified injuries of right wrist, hand and finger(s), initial encounter: Secondary | ICD-10-CM

## 2018-10-04 DIAGNOSIS — M25531 Pain in right wrist: Secondary | ICD-10-CM | POA: Diagnosis not present

## 2018-10-04 DIAGNOSIS — M25631 Stiffness of right wrist, not elsewhere classified: Secondary | ICD-10-CM

## 2018-10-04 NOTE — Patient Instructions (Signed)
Same HEP for ROM , and then strengthening - isometric - but end range for RD, UD, flexion and extention  Pain free  1 sets 12-15 and then increase gradually sets over next 6 days

## 2018-10-04 NOTE — Therapy (Signed)
[facility] [address], [place], [address] Phone: [phone]   Fax:  [phone]  Occupational Therapy Treatment  Patient Details  Name: Miranda Moore MRN: [id number] Date of Birth: [birth year] No data recorded  Encounter Date: 10/04/2018  OT End of Session - 10/04/18 1344    Visit Number  5    Number of Visits  16    Date for OT Re-Evaluation  11/09/18    OT Start Time  1308    OT Stop Time  1338    OT Time Calculation (min)  30 min    Activity Tolerance  Patient tolerated treatment well    Behavior During Therapy  [facility] for tasks assessed/performed       Past Medical History:  Diagnosis Date  . Anxiety   . Depression   . GERD (gastroesophageal reflux disease)   . Hypertension   . MRSA infection 2007   left lower leg  . Pre-diabetes   . Sleep apnea    uses CPAP nightly    Past Surgical History:  Procedure Laterality Date  . ABDOMINAL HYSTERECTOMY  2002  . APPENDECTOMY  2013   laparascopic with removal of tubal remnant  . COLONOSCOPY  2015  . KNEE SURGERY  2012  . LAPAROSCOPIC OOPHORECTOMY  2012  . WRIST ARTHROSCOPY WITH DEBRIDEMENT Right 08/30/2018   Procedure: WRIST ARTHROSCOPY WITH DEBRIDEMENT AND SYNOVECTOMY;  Surgeon: Roseanne Kaufman, MD;  Location: [place];  Service: Orthopedics;  Laterality: Right;    There were no vitals filed for this visit.  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  No/denies                   OT Treatments/Exercises (OP) - 10/04/18 0001      RUE Fluidotherapy   Number Minutes Fluidotherapy  8 Minutes    RUE  Fluidotherapy Location  Hand;Wrist    Comments  AROM in all planes - focus on composite flexion        AAROM over edge of table for flexion , ext, RD, UD - 10 reps review - no issues  pain free  Composite flexion PROM for wrist   And then followed by AROM - same that she was doing at home  great progress and even again after AAROM in flexion of wrist cont with splint per surgeon order  - and pain free ROM     Change her  isometric strength to end range for wrist  flexion, Ext, RD, UD  12 reps   and can increase if pain free to 2 sets 3 days , then 3 sets in 6 day       OT Education - 10/04/18 1344    Education Details   review AAROM  and cont isometric     Person(s) Educated  Patient    Methods  Explanation;Demonstration;Handout    Comprehension  Verbalized understanding;Returned demonstration       OT Short Term Goals - 09/14/18 1707  OT SHORT TERM GOAL #1   Title  Pain on PRWHE improve to less than 8/50 with use in ADL's and IADL's     Baseline  soreness at rest -and pain score on PRWHE 24/50 - was in splint up to now     Time  4    Period  Weeks    Status  New    Target Date  10/12/18      OT SHORT TERM GOAL #2   Title  Pt to be ind in HEP to increase AROM in wrist to [facility] without increase symptoms     Baseline  AROM decrease in all planes with flexion, ext, RD, sup worse and pain increased to 4/10 with RD     Time  4    Period  Weeks    Status  New    Target Date  10/12/18        OT Long Term Goals - 09/14/18 1709      OT LONG TERM GOAL #1   Title  R wrist strength increase to 4+/5 to be able to lift , pull , push more than  5 lbs without symptoms     Baseline  no strenghtening yet     Time  8    Period  Weeks    Status  New    Target Date  11/09/18      OT LONG TERM GOAL #2   Title  Grip and prehension  strength in the R increase to more than 50% compare to L to return to prior level of function and no increase symptoms     Baseline  NT - 2  wks s/p     Time  8    Period  Weeks    Status  New    Target Date  11/09/18      OT LONG TERM GOAL #3   Title  Function score on PRWHE improve wiht more than 25 points     Baseline  at eval PRWHE function score 46/50     Time  8    Period  Weeks    Status  New    Target Date  11/09/18            Plan - 10/04/18 1345    Clinical Impression Statement  Pt is 5 wks s/p TFCC surgery - pt show great progress in AROM and pain - and able to tolerate isometric strengthening this date - pt to follow up next week with surgeon and will increase strengtrening for pt to return back to 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 (Please refer to evaluation for details):  ADL's;IADL's;Work;Leisure    Rehab Potential  Good    Current Impairments/barriers affecting progress:  length of time since injury, pain, hypersensitivity to right wrist and hand- since march 19    OT Frequency  1x / week    OT Duration  4 weeks    OT Treatment/Interventions  Self-care/ADL training;Therapeutic exercise;Moist Heat;Paraffin;Splinting;Patient/family education;Fluidtherapy;Contrast Bath;Manual Therapy;Passive range of motion    Plan  results of surgeon visit and increase strength     Clinical Decision Making  Multiple treatment options, significant modification of task necessary    OT Home Exercise Plan  see pt instruction    Consulted and Agree with Plan of Care  Patient       Patient will benefit from skilled therapeutic intervention in order to improve the following deficits  and impairments:  Increased edema, Impaired flexibility, Pain, Decreased range of motion, Decreased strength, Impaired UE functional use  Visit Diagnosis: Muscle weakness (generalized)  TFCC (triangular fibrocartilage complex) injury, right, initial encounter  Stiffness of right wrist joint    Problem List Patient Active Problem List   Diagnosis Date Noted  . Abscess of axilla, right 09/09/2015   . Abortion, spontaneous 05/31/2015  . Edema leg 05/31/2015  . Chest discomfort 05/31/2015  . Cough 05/31/2015  . Diabetes mellitus ([place]) 05/31/2015  . Accumulation of fluid in tissues 05/31/2015  . Big thyroid 05/31/2015  . Cardiac murmur 05/31/2015  . Hot flash, menopausal 05/31/2015  . LBP (low back pain) 05/31/2015  . Acute thoracic back pain 05/31/2015  . Multinodular goiter 05/31/2015  . Adiposity 05/31/2015  . Abnormal blood sugar 05/31/2015  . Cutaneous eruption 05/31/2015  . Avitaminosis D 05/31/2015  . Anxiety 02/15/2015  . Accessory skin tags 11/29/2014  . Esophagitis, reflux 04/27/2008  . Other general symptoms and signs 10/05/2006  . Pure hypercholesterolemia 09/29/2006  . 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 Acute stress disorder 02/18/2006  . 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 Bone/cartilage disorder 07/29/2005  . Benign essential HTN 07/10/2005    Rosalyn Gess OTR/l,CLT 10/04/2018, 1:47 PM  [place] [address] [place], [address] Phone: [phone]   Fax:  [phone]  Name: Miranda Moore MRN: [id number] Date of Birth: Aug 13, 1960

## 2018-10-11 ENCOUNTER — Ambulatory Visit: Payer: BC Managed Care – PPO | Admitting: Physician Assistant

## 2018-10-11 ENCOUNTER — Encounter: Payer: Self-pay | Admitting: Physician Assistant

## 2018-10-11 VITALS — BP 120/73 | HR 69 | Temp 98.3°F | Wt 251.4 lb

## 2018-10-11 DIAGNOSIS — L0291 Cutaneous abscess, unspecified: Secondary | ICD-10-CM | POA: Diagnosis not present

## 2018-10-11 DIAGNOSIS — E119 Type 2 diabetes mellitus without complications: Secondary | ICD-10-CM

## 2018-10-11 MED ORDER — DOXYCYCLINE HYCLATE 100 MG PO TABS: 100.0000 mg | ORAL_TABLET | Freq: Two times a day (BID) | ORAL | 0 refills | Status: AC

## 2018-10-11 NOTE — Patient Instructions (Signed)

## 2018-10-11 NOTE — Progress Notes (Signed)
Patient: Miranda Moore Female    DOB: Feb 09, 1961   58 y.o.   MRN: [id number] Visit Date: 10/11/2018  Today's Provider: Trinna Post, PA-C   Chief Complaint  Patient presents with  . Rash   Subjective:     Rash  This is a new problem. The current episode started in the past 7 days. The problem has been gradually worsening since onset. The affected locations include the back. The rash is characterized by pain, redness and swelling. She was exposed to an insect bite/sting. Treatments tried: warm compress and NSAIDS. The treatment provided no relief.    Allergies  Allergen Reactions  . Chlorpheniramine-Phenylephrine Hives  . Other Hives    actifed  . Sulfa Antibiotics   . Sulfur Other (See Comments)    Low b/p     Current Outpatient Medications:  .  ALPRAZolam (XANAX) 0.5 MG tablet, TAKE 1-2 TABLETS BY MOUTH 3 TIMES DAILY PRN FOR ANXIETY STRESS, Disp: 90 tablet, Rfl: 1 .  amLODIPine-Valsartan-HCTZ 10-160-12.5 MG TABS, TAKE 1 TABLET DAILY FOR    HYPERTENSION, Disp: 90 tablet, Rfl: 3 .  aspirin 81 MG tablet, Take 81 mg by mouth daily., Disp: , Rfl:  .  Calcium Carbonate-Vitamin D 600-200 MG-UNIT TABS, , Disp: , Rfl:  .  Cholecalciferol (VITAMIN D-3) 1000 UNITS CAPS, Take by mouth daily., Disp: , Rfl:  .  furosemide (LASIX) 20 MG tablet, TAKE 1 TABLET DAILY AS     NEEDED FOR EDEMA, Disp: 90 tablet, Rfl: 1 .  Omega-3 Fatty Acids (FISH OIL DOUBLE STRENGTH) 1200 MG CAPS, Take by mouth., Disp: , Rfl:  .  omeprazole (PRILOSEC) 40 MG capsule, TAKE 1 CAPSULE DAILY FOR   REFLUX ESOPHAGITIS WITH    GASTRITIS, Disp: 90 capsule, Rfl: 3 .  POTASSIUM CITRATE PO, Take by mouth daily., Disp: , Rfl:  .  sertraline (ZOLOFT) 50 MG tablet, TAKE 1 TABLET DAILY, Disp: 90 tablet, Rfl: 3  Review of Systems  Constitutional: Negative.   HENT: Negative.   Eyes: Negative.   Respiratory: Negative.   Cardiovascular: Negative.   Gastrointestinal: Negative.   Endocrine: Negative.   Genitourinary:  Negative.   Musculoskeletal: Negative.   Skin: Positive for rash.  Allergic/Immunologic: Negative.   Neurological: Negative.   Hematological: Negative.   Psychiatric/Behavioral: Negative.     Social History   Tobacco Use  . Smoking status: Never Smoker  . Smokeless tobacco: Never Used  Substance Use Topics  . Alcohol use: Yes    Alcohol/week: 0.0 standard drinks    Comment: 2-4/week      Objective:   There were no vitals taken for this visit. There were no vitals filed for this visit.   Physical Exam Constitutional:      Appearance: Normal appearance.  Cardiovascular:     Rate and Rhythm: Normal rate and regular rhythm.     Heart sounds: Normal heart sounds.  Skin:    General: Skin is warm and dry.       Neurological:     Mental Status: She is alert and oriented to person, place, and time. Mental status is at baseline.  Psychiatric:        Mood and Affect: Mood normal.        Behavior: Behavior normal.         Assessment & Plan    1. Abscess  Abscess vs infected cyst, counseled patient to use antibiotics and warm compresses. Abscess will likely come to a  head and drain on its own which I have advised patient is expected. If not draining, or worsening may have to come to clinic for I&D. If cyst still suspected, may need surgery referral for removal.   - doxycycline (VIBRA-TABS) 100 MG tablet; Take 1 tablet (100 mg total) by mouth 2 (two) times daily for 7 days.  Dispense: 14 tablet; Refill: 0  2. Type 2 diabetes mellitus without complication, without long-term current use of insulin ([place])  Diabetes well controlled based on last A1c check, continue to monitor.   The entirety of the information documented in the History of Present Illness, Review of Systems and Physical Exam were personally obtained by me. Portions of this information were initially documented by [facility], CMA and reviewed by me for thoroughness and accuracy.               Trinna Post, PA-C  [facility]

## 2018-10-12 ENCOUNTER — Ambulatory Visit: Payer: PRIVATE HEALTH INSURANCE | Attending: Orthopedic Surgery | Admitting: Occupational Therapy

## 2018-10-12 DIAGNOSIS — S6981XA Other specified injuries of right wrist, hand and finger(s), initial encounter: Secondary | ICD-10-CM | POA: Insufficient documentation

## 2018-10-12 DIAGNOSIS — M25631 Stiffness of right wrist, not elsewhere classified: Secondary | ICD-10-CM | POA: Diagnosis present

## 2018-10-12 DIAGNOSIS — R278 Other lack of coordination: Secondary | ICD-10-CM | POA: Diagnosis present

## 2018-10-12 DIAGNOSIS — M25531 Pain in right wrist: Secondary | ICD-10-CM | POA: Diagnosis present

## 2018-10-12 DIAGNOSIS — M6281 Muscle weakness (generalized): Secondary | ICD-10-CM | POA: Diagnosis not present

## 2018-10-12 NOTE — Therapy (Signed)
[place] [facility] [address] [place], [address] Phone: [phone]   Fax:  [phone]  Occupational Therapy Treatment  Patient Details  Name: [name] MRN: [id number] Date of Birth: 20-Sep-1960 No data recorded  Encounter Date: 10/12/2018  OT End of Session - 10/12/18 1852    Visit Number  6    Number of Visits  16    Date for OT Re-Evaluation  11/09/18    Authorization Type  workers compensation    OT Start Time  1445    OT Stop Time  1524    OT Time Calculation (min)  39 min    Activity Tolerance  Patient tolerated treatment well    Behavior During Therapy  [facility] for tasks assessed/performed       Past Medical History:  Diagnosis Date  . Anxiety   . Depression   . GERD (gastroesophageal reflux disease)   . Hypertension   . MRSA infection 2007   left lower leg  . Pre-diabetes   . Sleep apnea    uses CPAP nightly    Past Surgical History:  Procedure Laterality Date  . ABDOMINAL HYSTERECTOMY  2002  . APPENDECTOMY  2013   laparascopic with removal of tubal remnant  . COLONOSCOPY  2015  . KNEE SURGERY  2012  . LAPAROSCOPIC OOPHORECTOMY  2012  . WRIST ARTHROSCOPY WITH DEBRIDEMENT Right 08/30/2018   Procedure: WRIST ARTHROSCOPY WITH DEBRIDEMENT AND SYNOVECTOMY;  Surgeon: Miranda Kaufman, MD;  Location: [place];  Service: Orthopedics;  Laterality: Right;    There were no vitals filed for this visit.  Subjective Assessment - 10/12/18 1849    Subjective   Seen Dr Amedeo Plenty this am - can start strengthening and follow up in 6 wks - was happy with my progress - it is today a year ago - that I was hurt at the job     Patient Stated Goals  I want to be able to get my wrist ROM and strength back to use it like before without pain     Currently in Pain?  No/denies         [facility] OT Assessment - 10/12/18 0001      Strength   Right Hand Grip (lbs)  32    Right Hand Lateral Pinch  21 lbs     Right Hand 3 Point Pinch  16 lbs    Left Hand Grip (lbs)  65    Left Hand Lateral Pinch  23 lbs    Left Hand 3 Point Pinch  18 lbs       Assess grip and prehension strength- see flowsheet   pt report no pain   done PRWHE with pt for pain 10/50 because she do not do anything heavy  And function 28/50   Pt seen surgeon this am -and can do strengthening -and up her weight to 10 lbs  Will follow up with surgeon in 6 wks   This date upgrade her HEP   pain free to be - did attempt 2 lbs but pt had 2-3/10 pain  Decrease to 1 lbs or 16oz hammer    16oz hammer for wrist in all planes  And teal putty for grip , lat grip and 3 point ( 2nd/3rd and 4th/5th )   12-15 reps pain free   2 x day for 3 days  Then increase to 2 sets 3 days , 3 sets in another 3 days -  if pain free ( reintegrate with pt again pain free)  Ice at end done                 OT Education - 10/12/18 1852    Education Details  review and upgrade HEP for strengthening     Person(s) Educated  Patient    Methods  Explanation;Demonstration;Handout    Comprehension  Verbalized understanding;Returned demonstration       OT Short Term Goals - 10/12/18 1908      OT SHORT TERM GOAL #1   Title  Pain on PRWHE improve to less than 8/50 with use in ADL's and IADL's     Baseline  soreness at rest -and pain score on PRWHE 24/50 at eval - and now 2 /10 with 2 lbs weight - no pain with use     Status  Achieved      OT SHORT TERM GOAL #2   Title  Pt to be ind in HEP to increase AROM in wrist to [facility] without increase symptoms     Status  Achieved        OT Long Term Goals - 10/12/18 1909      OT LONG TERM GOAL #1   Title  R wrist strength increase to 4+/5 to be able to lift , pull , push more than  5 lbs without symptoms     Baseline  initiate strengtening this date with weight and putty     Time  4    Period  Weeks    Target Date  11/09/18      OT LONG TERM GOAL #2   Title  Grip and prehension  strength  in the R increase to more than 50% compare to L to return to prior level of function and no increase symptoms     Baseline  R hand 32, L 65 lbs ;     Time  4    Period  Weeks    Status  On-going    Target Date  11/23/18      OT LONG TERM GOAL #3   Title  Function score on PRWHE improve wiht more than 25 points     Baseline  at eval PRWHE function score 46/50 , improve to 28/50     Time  4    Period  Weeks    Status  On-going    Target Date  11/23/18            Plan - 10/12/18 1859    Clinical Impression Statement  Pt is 6 wks s/p TFCC surgery - pt seen surgeon this date and can start strengthening and up to 10 lbs - and to follow up in 6 wks again - pt able to tolerate 1 lbs this date for wrist and medium putty- had some pain with 2 lbs weight - pt to increase sets this week and follow up in 8 days     Occupational performance deficits (Please refer to evaluation for details):  ADL's;IADL's;Work;Leisure    Rehab Potential  Good    Clinical Decision Making  Several treatment options, min-mod task modification necessary    OT Frequency  1x / week    OT Duration  6 weeks    OT Treatment/Interventions  Self-care/ADL training;Therapeutic exercise;Moist Heat;Paraffin;Splinting;Patient/family education;Fluidtherapy;Contrast Bath;Manual Therapy;Passive range of motion    Plan  assess progress with 1 lbs weight and if can upgrade to 2 lbs and putty increase resistance     OT  Home Exercise Plan  see pt instruction    Consulted and Agree with Plan of Care  Patient       Patient will benefit from skilled therapeutic intervention in order to improve the following deficits and impairments:     Visit Diagnosis: Muscle weakness (generalized)  TFCC (triangular fibrocartilage complex) injury, right, initial encounter  Stiffness of right wrist joint  Pain in right wrist  Other lack of coordination    Problem List Patient Active Problem List   Diagnosis Date Noted  . Abscess of  axilla, right 09/09/2015  . Abortion, spontaneous 05/31/2015  . Edema leg 05/31/2015  . Chest discomfort 05/31/2015  . Cough 05/31/2015  . Diabetes mellitus ([place]) 05/31/2015  . Accumulation of fluid in tissues 05/31/2015  . Big thyroid 05/31/2015  . Cardiac murmur 05/31/2015  . Hot flash, menopausal 05/31/2015  . LBP (low back pain) 05/31/2015  . Acute thoracic back pain 05/31/2015  . Multinodular goiter 05/31/2015  . Adiposity 05/31/2015  . Abnormal blood sugar 05/31/2015  . Cutaneous eruption 05/31/2015  . Avitaminosis D 05/31/2015  . Anxiety 02/15/2015  . Accessory skin tags 11/29/2014  . Esophagitis, reflux 04/27/2008  . Other general symptoms and signs 10/05/2006  . Pure hypercholesterolemia 09/29/2006  . 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 Acute stress disorder 02/18/2006  . 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 Bone/cartilage disorder 07/29/2005  . Benign essential HTN 07/10/2005    Rosalyn Gess OTR/l,CLT  10/12/2018, 7:16 PM  [place] [facility] [address], [place], [address] Phone: [phone]   Fax:  [phone]  Name: Miranda Moore MRN: [id number] Date of Birth: 1960/09/15

## 2018-10-12 NOTE — Patient Instructions (Signed)
16oz hammer for wrist in all planes  And teal putty for grip , lat grip and 3 point ( 2nd/3rd and 4th/5th )   12-15 reps pain free   2 x day for 3 days  Then increase to 2 sets 3 days , 3 sets in another 3 days - if pain free  Ice at end

## 2018-10-21 ENCOUNTER — Encounter: Payer: Self-pay | Admitting: Physician Assistant

## 2018-10-21 ENCOUNTER — Ambulatory Visit: Payer: PRIVATE HEALTH INSURANCE | Admitting: Occupational Therapy

## 2018-10-21 ENCOUNTER — Ambulatory Visit: Payer: BC Managed Care – PPO | Admitting: Physician Assistant

## 2018-10-21 ENCOUNTER — Other Ambulatory Visit: Payer: Self-pay

## 2018-10-21 VITALS — BP 108/65 | HR 76 | Temp 98.2°F | Resp 16 | Wt 250.2 lb

## 2018-10-21 DIAGNOSIS — L0291 Cutaneous abscess, unspecified: Secondary | ICD-10-CM

## 2018-10-21 DIAGNOSIS — M6281 Muscle weakness (generalized): Secondary | ICD-10-CM

## 2018-10-21 DIAGNOSIS — M25531 Pain in right wrist: Secondary | ICD-10-CM

## 2018-10-21 DIAGNOSIS — R278 Other lack of coordination: Secondary | ICD-10-CM

## 2018-10-21 DIAGNOSIS — S6981XA Other specified injuries of right wrist, hand and finger(s), initial encounter: Secondary | ICD-10-CM

## 2018-10-21 DIAGNOSIS — M25631 Stiffness of right wrist, not elsewhere classified: Secondary | ICD-10-CM

## 2018-10-21 NOTE — Progress Notes (Signed)
Patient: Miranda Moore Female    DOB: 09-11-1960   58 y.o.   MRN: [id number] Visit Date: 10/21/2018  Today's Provider: Trinna Post, PA-C   Chief Complaint  Patient presents with  . Cyst   Subjective:     HPI  Patient seen in clinic on 10/11/2018 and prescribed doxycycline 100 mg BID x 7 days. She completed this and reports cyst is not any better. She reports that is swollen and is tender. Reports that her husband tried to squeeze it. She reports that she tried a heating pad.  Allergies  Allergen Reactions  . Chlorpheniramine-Phenylephrine Hives  . Other Hives    actifed  . Sulfa Antibiotics   . Sulfur Other (See Comments)    Low b/p     Current Outpatient Medications:  .  ALPRAZolam (XANAX) 0.5 MG tablet, TAKE 1-2 TABLETS BY MOUTH 3 TIMES DAILY PRN FOR ANXIETY STRESS, Disp: 90 tablet, Rfl: 1 .  amLODIPine-Valsartan-HCTZ 10-160-12.5 MG TABS, TAKE 1 TABLET DAILY FOR    HYPERTENSION, Disp: 90 tablet, Rfl: 3 .  aspirin 81 MG tablet, Take 81 mg by mouth daily., Disp: , Rfl:  .  Calcium Carbonate-Vitamin D 600-200 MG-UNIT TABS, , Disp: , Rfl:  .  Cholecalciferol (VITAMIN D-3) 1000 UNITS CAPS, Take by mouth daily., Disp: , Rfl:  .  furosemide (LASIX) 20 MG tablet, TAKE 1 TABLET DAILY AS     NEEDED FOR EDEMA, Disp: 90 tablet, Rfl: 1 .  Omega-3 Fatty Acids (FISH OIL DOUBLE STRENGTH) 1200 MG CAPS, Take by mouth., Disp: , Rfl:  .  omeprazole (PRILOSEC) 40 MG capsule, TAKE 1 CAPSULE DAILY FOR   REFLUX ESOPHAGITIS WITH    GASTRITIS, Disp: 90 capsule, Rfl: 3 .  POTASSIUM CITRATE PO, Take by mouth daily., Disp: , Rfl:  .  sertraline (ZOLOFT) 50 MG tablet, TAKE 1 TABLET DAILY, Disp: 90 tablet, Rfl: 3  Review of Systems  Social History   Tobacco Use  . Smoking status: Never Smoker  . Smokeless tobacco: Never Used  Substance Use Topics  . Alcohol use: Yes    Alcohol/week: 0.0 standard drinks    Comment: 2-4/week      Objective:   BP 108/65 (BP Location: Left Arm, Patient  Position: Sitting, Cuff Size: Large)   Pulse 76   Temp 98.2 F (36.8 C) (Oral)   Resp 16   Wt 250 lb 3.2 oz (113.5 kg)   BMI 36.95 kg/m  Vitals:   10/21/18 1215  BP: 108/65  Pulse: 76  Resp: 16  Temp: 98.2 F (36.8 C)  TempSrc: Oral  Weight: 250 lb 3.2 oz (113.5 kg)     Physical Exam Skin:        Incision and Drainage Procedure Note  Pre-operative Diagnosis: Abscess  Post-operative Diagnosis: same  Indications: Painful abscess not responding to PO antibiotics  Anesthesia: 2% lidocaine with epinephrine  Procedure Details  The procedure, risks and complications have been discussed in detail (including, but not limited to airway compromise, infection, bleeding) with the patient, and the patient has signed consent to the procedure. Patient denies any allergies to lidocaine or epinephrine.   The skin was sterilely prepped and  in the usual fashion. After adequate local anesthesia, I&D with a #11 blade was performed on the left posterior thoracic area. Purulent drainage: present The patient was observed until stable.  Findings: Abscess with reduced fluctuance s/p procedure.  EBL: 2 cc's    Condition: Tolerated procedure well  Complications: none.      Assessment & Plan    1. Abscess  Patient tolerated procedure well. Instructed on post I&D care. Advised patient she may need f/u with surgery if abscess recurrent.  The entirety of the information documented in the History of Present Illness, Review of Systems and Physical Exam were personally obtained by me. Portions of this information were initially documented by Lyndel Pleasure, CMA and reviewed by me for thoroughness and accuracy.   F/u PRN.        Trinna Post, PA-C  [facility]

## 2018-10-21 NOTE — Therapy (Signed)
[facility] [address] [place], [address] Phone: [phone]   Fax:  [phone]  Occupational Therapy Treatment  Patient Details  Name: Miranda Moore MRN: [id number] Date of Birth: 11-26-1960 No data recorded  Encounter Date: 10/21/2018  OT End of Session - 10/21/18 1131    Visit Number  7    Number of Visits  16    Date for OT Re-Evaluation  11/09/18    OT Start Time  0830    OT Stop Time  0903    OT Time Calculation (min)  33 min    Activity Tolerance  Patient tolerated treatment well    Behavior During Therapy  [employer] for tasks assessed/performed       Past Medical History:  Diagnosis Date  . Anxiety   . Depression   . GERD (gastroesophageal reflux disease)   . Hypertension   . MRSA infection 2007   left lower leg  . Pre-diabetes   . Sleep apnea    uses CPAP nightly    Past Surgical History:  Procedure Laterality Date  . ABDOMINAL HYSTERECTOMY  2002  . APPENDECTOMY  2013   laparascopic with removal of tubal remnant  . COLONOSCOPY  2015  . KNEE SURGERY  2012  . LAPAROSCOPIC OOPHORECTOMY  2012  . WRIST ARTHROSCOPY WITH DEBRIDEMENT Right 08/30/2018   Procedure: WRIST ARTHROSCOPY WITH DEBRIDEMENT AND SYNOVECTOMY;  Surgeon: Roseanne Kaufman, MD;  Location: [facility];  Service: Orthopedics;  Laterality: Right;    There were no vitals filed for this visit.  Subjective Assessment - 10/21/18 1130    Subjective   No pain -doing okay with HEP - did not do it as much as I should have -     Patient Stated Goals  I want to be able to get my wrist ROM and strength back to use it like before without pain     Currently in Pain?  No/denies         [facility] OT Assessment - 10/21/18 0001      Strength   Right Hand Grip (lbs)  58    Right Hand Lateral Pinch  24 lbs    Right Hand 3 Point Pinch  19 lbs    Left Hand Grip (lbs)  65    Left Hand Lateral Pinch  23 lbs    Left Hand 3 Point  Pinch  18 lbs         great progress - still lack last 10 degrees of Flexion at wrist      Assess grip and prehension strength- see flowsheet   pt report no pain     This date upgrade her HEP again     2 lbs for wrist in all planes  And  Upgrade to green firm  for grip , twisting and pulling - using all digits    12-15 reps pain free   2 x day for 3 days  Then increase to 2 sets 3 days , 3 sets in another 3 days - if pain free ( reintegrate with pt again pain free)  Ice at end done  Also GTB for elbow flexion and exention as well as scapula squeezes  Same upgrade for reps and sets           OT Education - 10/21/18 1131    Education Details  upgrade to green putty and 2 lbs for wrist  OT Short Term Goals - 10/12/18 1908      OT SHORT TERM GOAL #1   Title  Pain on PRWHE improve to less than 8/50 with use in ADL's and IADL's     Baseline  soreness at rest -and pain score on PRWHE 24/50 at eval - and now 2 /10 with 2 lbs weight - no pain with use     Status  Achieved      OT SHORT TERM GOAL #2   Title  Pt to be ind in HEP to increase AROM in wrist to [facility] without increase symptoms     Status  Achieved        OT Long Term Goals - 10/12/18 1909      OT LONG TERM GOAL #1   Title  R wrist strength increase to 4+/5 to be able to lift , pull , push more than  5 lbs without symptoms     Baseline  initiate strengtening this date with weight and putty     Time  4    Period  Weeks    Target Date  11/09/18      OT LONG TERM GOAL #2   Title  Grip and prehension  strength in the R increase to more than 50% compare to L to return to prior level of function and no increase symptoms     Baseline  R hand 32, L 65 lbs ;     Time  4    Period  Weeks    Status  On-going    Target Date  11/23/18      OT LONG TERM GOAL #3   Title  Function score on PRWHE improve wiht more than 25 points     Baseline  at eval PRWHE function score 46/50 , improve to 28/50     Time   4    Period  Weeks    Status  On-going    Target Date  11/23/18            Plan - 10/21/18 1132    Clinical Impression Statement  Pt is about 7 wks s/p TFCC surgery - pt tolerated strengthening this past week very well - no pain  with HEP and progress greatly in grip /prehension strength - upgrade HEP this date     Occupational performance deficits (Please refer to evaluation for details):  ADL's;IADL's;Work;Leisure    Rehab Potential  Good    Clinical Decision Making  Several treatment options, min-mod task modification necessary    OT Frequency  1x / week    OT Duration  6 weeks    OT Treatment/Interventions  Self-care/ADL training;Therapeutic exercise;Moist Heat;Paraffin;Splinting;Patient/family education;Fluidtherapy;Contrast Bath;Manual Therapy;Passive range of motion    Plan  assess progress with 2 lbs weight and if can upgrade to 3lbs and putty increase resistance     OT Home Exercise Plan  see pt instruction    Consulted and Agree with Plan of Care  Patient       Patient will benefit from skilled therapeutic intervention in order to improve the following deficits and impairments:     Visit Diagnosis: Muscle weakness (generalized)  TFCC (triangular fibrocartilage complex) injury, right, initial encounter  Stiffness of right wrist joint  Pain in right wrist  Other lack of coordination    Problem List Patient Active Problem List   Diagnosis Date Noted  . Abscess of axilla, right 09/09/2015  . Abortion, spontaneous 05/31/2015  . Edema leg 05/31/2015  .  Chest discomfort 05/31/2015  . Cough 05/31/2015  . Diabetes mellitus ([place]) 05/31/2015  . Accumulation of fluid in tissues 05/31/2015  . Big thyroid 05/31/2015  . Cardiac murmur 05/31/2015  . Hot flash, menopausal 05/31/2015  . LBP (low back pain) 05/31/2015  . Acute thoracic back pain 05/31/2015  . Multinodular goiter 05/31/2015  . Adiposity 05/31/2015  . Abnormal blood sugar 05/31/2015  . Cutaneous  eruption 05/31/2015  . Avitaminosis D 05/31/2015  . Anxiety 02/15/2015  . Accessory skin tags 11/29/2014  . Esophagitis, reflux 04/27/2008  . Other general symptoms and signs 10/05/2006  . Pure hypercholesterolemia 09/29/2006  . 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 Acute stress disorder 02/18/2006  . 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 Bone/cartilage disorder 07/29/2005  . Benign essential HTN 07/10/2005    Rosalyn Gess OTR/l,CLT 10/21/2018, 11:40 AM  [facility] [address] [place], [address] Phone: [phone]   Fax:  [phone]  Name: Miranda Moore MRN: [id number] Date of Birth: 14-May-1961

## 2018-10-21 NOTE — Patient Instructions (Signed)
See note

## 2018-10-21 NOTE — Patient Instructions (Addendum)
Incision and Drainage, Care After  Refer to this sheet in the next few weeks. These instructions provide you with information about caring for yourself after your procedure. Your health care provider may also give you more specific instructions. Your treatment has been planned according to current medical practices, but problems sometimes occur. Call your health care provider if you have any problems or questions after your procedure.  What can I expect after the procedure?  After the procedure, it is common to have:  · Pain or discomfort around your incision site.  · Drainage from your incision.  Follow these instructions at home:  · Take over-the-counter and prescription medicines only as told by your health care provider.  · If you were prescribed an antibiotic medicine, take it as told by your health care provider. Do not stop taking the antibiotic even if you start to feel better.  · Follow instructions from your health care provider about:  ? How to take care of your incision.  ? When and how you should change your packing and bandage (dressing). Wash your hands with soap and water before you change your dressing. If soap and water are not available, use hand sanitizer.  ? When you should remove your dressing.  · Do not take baths, swim, or use a hot tub until your health care provider approves.  · Keep all follow-up visits as told by your health care provider. This is important.  · Check your incision area every day for signs of infection. Check for:  ? More redness, swelling, or pain.  ? More fluid or blood.  ? Warmth.  ? Pus or a bad smell.  Contact a health care provider if:  · Your cyst or abscess returns.  · You have a fever.  · You have more redness, swelling, or pain around your incision.  · You have more fluid or blood coming from your incision.  · Your incision feels warm to the touch.  · You have pus or a bad smell coming from your incision.  Get help right away if:  · You have severe pain or  bleeding.  · You cannot eat or drink without vomiting.  · You have decreased urine output.  · You become short of breath.  · You have chest pain.  · You cough up blood.  · The area where the incision and drainage occurred becomes numb or it tingles.  This information is not intended to replace advice given to you by your health care provider. Make sure you discuss any questions you have with your health care provider.  Document Released: 10/19/2011 Document Revised: 12/27/2015 Document Reviewed: 05/17/2015  Elsevier Interactive Patient Education © 2019 [employer]

## 2018-10-31 ENCOUNTER — Ambulatory Visit: Payer: PRIVATE HEALTH INSURANCE | Admitting: Occupational Therapy

## 2018-11-01 MED ORDER — LIDOCAINE-EPINEPHRINE 2 %-1:100000 IJ SOLN: 3.0000 mL | Freq: Once | INTRAMUSCULAR | Status: DC

## 2018-11-02 ENCOUNTER — Ambulatory Visit: Payer: PRIVATE HEALTH INSURANCE | Admitting: Occupational Therapy

## 2018-11-02 ENCOUNTER — Other Ambulatory Visit: Payer: Self-pay

## 2018-11-02 DIAGNOSIS — M25631 Stiffness of right wrist, not elsewhere classified: Secondary | ICD-10-CM

## 2018-11-02 DIAGNOSIS — M6281 Muscle weakness (generalized): Secondary | ICD-10-CM

## 2018-11-02 DIAGNOSIS — S6981XA Other specified injuries of right wrist, hand and finger(s), initial encounter: Secondary | ICD-10-CM

## 2018-11-02 DIAGNOSIS — M25531 Pain in right wrist: Secondary | ICD-10-CM

## 2018-11-02 NOTE — Therapy (Signed)
[facility] [address], [place], [address] Phone: [phone]   Fax:  [phone]  Occupational Therapy Treatment  Patient Details  Name: Miranda Moore MRN: [id number] Date of Birth: 1960-11-02 No data recorded  Encounter Date: 11/02/2018  OT End of Session - 11/02/18 1517    Visit Number  8    Number of Visits  16    Date for OT Re-Evaluation  11/09/18    Authorization Type  workers compensation    OT Start Time  1100    OT Stop Time  1147    OT Time Calculation (min)  47 min    Activity Tolerance  Patient tolerated treatment well    Behavior During Therapy  [facility] for tasks assessed/performed       Past Medical History:  Diagnosis Date  . Anxiety   . Depression   . GERD (gastroesophageal reflux disease)   . Hypertension   . MRSA infection 2007   left lower leg  . Pre-diabetes   . Sleep apnea    uses CPAP nightly    Past Surgical History:  Procedure Laterality Date  . ABDOMINAL HYSTERECTOMY  2002  . APPENDECTOMY  2013   laparascopic with removal of tubal remnant  . COLONOSCOPY  2015  . KNEE SURGERY  2012  . LAPAROSCOPIC OOPHORECTOMY  2012  . WRIST ARTHROSCOPY WITH DEBRIDEMENT Right 08/30/2018   Procedure: WRIST ARTHROSCOPY WITH DEBRIDEMENT AND SYNOVECTOMY;  Surgeon: Roseanne Kaufman, MD;  Location: [facility];  Service: Orthopedics;  Laterality: Right;    There were no vitals filed for this visit.  Subjective Assessment - 11/02/18 1511    Subjective   I am doing okay- no pain - was not able to do as many reps or sets as you told me - my aunt past away and had death in the family     Patient Stated Goals  I want to be able to get my wrist ROM and strength back to use it like before without pain     Currently in Pain?  No/denies         [facility] OT Assessment - 11/02/18 0001      AROM   Right Wrist Flexion  75 Degrees      Strength   Right Hand Grip (lbs)  72    Right  Hand Lateral Pinch  24 lbs    Right Hand 3 Point Pinch  19 lbs    Left Hand Grip (lbs)  80    Left Hand Lateral Pinch  25 lbs    Left Hand 3 Point Pinch  19 lbs       great progress - still lack composite Flexion at wrist    Assess grip and prehension strength- see flowsheet  pt report no pain          OT Treatments/Exercises (OP) - 11/02/18 0001      RUE Paraffin   Number Minutes Paraffin  8 Minutes    RUE Paraffin Location  Wrist    Comments  wrist flexion stretch with heatingpad at [facility]           This date upgrade her HEP again   3 lbs for wrist in all planes  3 sets of 15 pain free  And  Upgrade to dark blue putty  firm  for grip , twisting and pulling - using all digits   Needed mod A for correctly doing HEP -  2 sets of 12   12-15 reps pain free  1 x day  Ice at enddone  Also upgrade to blue therapy band for  Simulating activities like pulling or pushing ( rolling pt's in bed ) and using gaitbelt  12 reps - can increase to 2 sets pain free      HEP :    Pt to cont wrist in all planes 3 lbs  3 sets of 15  1 x day  Putty increase to dark blue firm putty - grip ,twisting and pulling - 12 reps  2 sets  1  Day  Blue band for simulating pulling or pushing ( rolling pt's in bed)  And bicep curl - simulate gait belt use  12 reps   increase gradually -if pain free      OT Education - 11/02/18 1516    Education Details  to increase reps and sets pain free - to work on act tolerance for wrist and simulate work Hotel manager) Educated  Patient    Methods  Explanation;Demonstration;Handout    Comprehension  Verbalized understanding;Returned demonstration       OT Short Term Goals - 10/12/18 1908      OT SHORT TERM GOAL #1   Title  Pain on PRWHE improve to less than 8/50 with use in ADL's and IADL's     Baseline  soreness at rest -and pain score on PRWHE 24/50 at eval - and now 2 /10 with 2 lbs weight - no pain with use     Status   Achieved      OT SHORT TERM GOAL #2   Title  Pt to be ind in HEP to increase AROM in wrist to [facility] without increase symptoms     Status  Achieved        OT Long Term Goals - 10/12/18 1909      OT LONG TERM GOAL #1   Title  R wrist strength increase to 4+/5 to be able to lift , pull , push more than  5 lbs without symptoms     Baseline  initiate strengtening this date with weight and putty     Time  4    Period  Weeks    Target Date  11/09/18      OT LONG TERM GOAL #2   Title  Grip and prehension  strength in the R increase to more than 50% compare to L to return to prior level of function and no increase symptoms     Baseline  R hand 32, L 65 lbs ;     Time  4    Period  Weeks    Status  On-going    Target Date  11/23/18      OT LONG TERM GOAL #3   Title  Function score on PRWHE improve wiht more than 25 points     Baseline  at eval PRWHE function score 46/50 , improve to 28/50     Time  4    Period  Weeks    Status  On-going    Target Date  11/23/18            Plan - 11/02/18 1518    Clinical Impression Statement  Pt is about 9 wks s/p TFCC surgery - pt doing very well with strengthening and pain - but had funeral in family and was not able to work on so many reps and sets - pt to  increase sets and reps with 3 lbs and increase her putty resistance - and 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 (Please refer to evaluation for details):  ADL's;IADL's;Work;Leisure    Rehab Potential  Good    Clinical Decision Making  Several treatment options, min-mod task modification necessary    Modification or Assistance to Complete Evaluation   No modification of tasks or assist necessary to complete eval    OT Frequency  1x / week    OT Duration  2 weeks    OT Treatment/Interventions  Self-care/ADL training;Therapeutic exercise;Moist Heat;Paraffin;Splinting;Patient/family education;Fluidtherapy;Contrast Bath;Manual Therapy;Passive range of motion    Plan   assess progress with 3 lbs weight and dark firm putty     OT Home Exercise Plan  see pt instruction    Consulted and Agree with Plan of Care  Patient       Patient will benefit from skilled therapeutic intervention in order to improve the following deficits and impairments:     Visit Diagnosis: Muscle weakness (generalized)  TFCC (triangular fibrocartilage complex) injury, right, initial encounter  Stiffness of right wrist joint  Pain in right wrist    Problem List Patient Active Problem List   Diagnosis Date Noted  . Abscess of axilla, right 09/09/2015  . Abortion, spontaneous 05/31/2015  . Edema leg 05/31/2015  . Chest discomfort 05/31/2015  . Cough 05/31/2015  . Diabetes mellitus ([place]) 05/31/2015  . Accumulation of fluid in tissues 05/31/2015  . Big thyroid 05/31/2015  . Cardiac murmur 05/31/2015  . Hot flash, menopausal 05/31/2015  . LBP (low back pain) 05/31/2015  . Acute thoracic back pain 05/31/2015  . Multinodular goiter 05/31/2015  . Adiposity 05/31/2015  . Abnormal blood sugar 05/31/2015  . Cutaneous eruption 05/31/2015  . Avitaminosis D 05/31/2015  . Anxiety 02/15/2015  . Accessory skin tags 11/29/2014  . Esophagitis, reflux 04/27/2008  . Other general symptoms and signs 10/05/2006  . Pure hypercholesterolemia 09/29/2006  . 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 Acute stress disorder 02/18/2006  . 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 Bone/cartilage disorder 07/29/2005  . Benign essential HTN 07/10/2005    Rosalyn Gess OTR/L,CLT 11/02/2018, 3:21 PM  [facility] [address] [place], [address] Phone: [phone]   Fax:  [phone]  Name: Miranda Moore MRN: [id number] Date of Birth: [birth year]

## 2018-11-02 NOTE — Patient Instructions (Signed)
Pt to cont wrist in all planes 3 lbs  3 sets of 15  1 x day  Putty increase to dark blue firm putty - grip ,twisting and pulling - 12 reps  2 sets  1  Day  Blue band for simulating pulling or pushing ( rolling pt's in bed)  And bicep curl - simulate gait belt use  12 reps   increase gradually -if pain free

## 2018-11-14 ENCOUNTER — Other Ambulatory Visit: Payer: Self-pay

## 2018-11-14 ENCOUNTER — Ambulatory Visit: Payer: PRIVATE HEALTH INSURANCE | Attending: Orthopedic Surgery | Admitting: Occupational Therapy

## 2018-11-14 DIAGNOSIS — M25631 Stiffness of right wrist, not elsewhere classified: Secondary | ICD-10-CM | POA: Diagnosis present

## 2018-11-14 DIAGNOSIS — S6981XA Other specified injuries of right wrist, hand and finger(s), initial encounter: Secondary | ICD-10-CM | POA: Insufficient documentation

## 2018-11-14 DIAGNOSIS — M25531 Pain in right wrist: Secondary | ICD-10-CM | POA: Insufficient documentation

## 2018-11-14 DIAGNOSIS — M6281 Muscle weakness (generalized): Secondary | ICD-10-CM | POA: Diagnosis not present

## 2018-11-14 DIAGNOSIS — R278 Other lack of coordination: Secondary | ICD-10-CM | POA: Insufficient documentation

## 2018-11-14 NOTE — Patient Instructions (Signed)
Cont with 3 lbs weight - but do 2 sets and then 3rd set with 2 lbs  12 reps  Do not force RD ,UD end ranges  Putty dark firm blue - grip , pulling and twisting- cont same HEP  BTB - cont with scapula retraction, shoulder extention Simulate pushing bands into protraction Can double band for tricep  And rotate different forearm positions for elbow flexion - single band  12 reps and gradually increase sets

## 2018-11-14 NOTE — Therapy (Signed)
[facility] [address], [place], [address] Phone: [phone]   Fax:  [phone]  Occupational Therapy Treatment  Patient Details  Name: Miranda Moore MRN: [id number] Date of Birth: 1961/04/26 No data recorded  Encounter Date: 11/14/2018  OT End of Session - 11/14/18 0859    Visit Number  9    Number of Visits  12    Date for OT Re-Evaluation  12/19/18    Authorization - Visit Number  9    Authorization - Number of Visits  12    OT Start Time  0802    OT Stop Time  0848    OT Time Calculation (min)  46 min    Activity Tolerance  Patient tolerated treatment well    Behavior During Therapy  [facility] for tasks assessed/performed       Past Medical History:  Diagnosis Date  . Anxiety   . Depression   . GERD (gastroesophageal reflux disease)   . Hypertension   . MRSA infection 2007   left lower leg  . Pre-diabetes   . Sleep apnea    uses CPAP nightly    Past Surgical History:  Procedure Laterality Date  . ABDOMINAL HYSTERECTOMY  2002  . APPENDECTOMY  2013   laparascopic with removal of tubal remnant  . COLONOSCOPY  2015  . KNEE SURGERY  2012  . LAPAROSCOPIC OOPHORECTOMY  2012  . WRIST ARTHROSCOPY WITH DEBRIDEMENT Right 08/30/2018   Procedure: WRIST ARTHROSCOPY WITH DEBRIDEMENT AND SYNOVECTOMY;  Surgeon: Roseanne Kaufman, MD;  Location: [facility];  Service: Orthopedics;  Laterality: Right;    There were no vitals filed for this visit.  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  No/denies         [facility] OT Assessment - 11/14/18 0001      AROM   Right Wrist Flexion  82 Degrees      Strength   Right Hand Grip (lbs)  80    Right Hand Lateral Pinch  24 lbs    Right Hand 3 Point Pinch  15 lbs    Left Hand Grip (lbs)  80    Left Hand Lateral Pinch  24 lbs    Left Hand 3 Point Pinch  15 lbs       assess grip and prehension - no issues  And dark blue putty - pt repot no issues- easy to do at home  But not 3 lbs - feel little soreness on ulnar side in 2nd set of 3 lbs  Reinforce again with pt to work on sets -and act tolerance - she had funeral the previous week and did not do 3 sets of 2 lbs   pt to do 2 sets of 3 lbs but do not force end range and do 3rd set with 2 lbs - 12 reps  And pain free         OT Treatments/Exercises (OP) - 11/14/18 0001  RUE Paraffin   Number Minutes Paraffin  8 Minutes    RUE Paraffin Location  Wrist    Comments  wrist flexion stretch       Cont with 3 lbs weight - but do 2 sets and then 3rd set with 2 lbs  12 reps  Do not force RD ,UD end ranges - pt was pushing and forcing end range - causing some discomfort   Putty dark firm blue - grip , pulling and twisting- cont same HEP   Blue TB - cont with scapula retraction, shoulder extention Simulate pushing bands into protraction Simulate with above rolling or pushing person over in bed   Can double band for tricep  And rotate different forearm/wrist positions for elbow flexion - single band  12 reps and gradually increase sets         OT Education - 11/14/18 0858    Education Details  to increase reps and sets pain free - to work on act tolerance for wrist and simulate work act -do 2 sets with 3 lbs and then 3rd with 2lbs     Person(s) Educated  Patient    Methods  Explanation;Demonstration;Handout    Comprehension  Verbalized understanding;Returned demonstration       OT Short Term Goals - 11/14/18 0953      OT SHORT TERM GOAL #1   Title  Pain on PRWHE improve to less than 8/50 with use in ADL's and IADL's      Baseline  pain score on PRWHE 24/50 at eval - and now 0/10  - no pain with use - lmited to less than 10 lbs     Status  Achieved      OT SHORT TERM GOAL #2   Title  Pt to be ind in HEP to increase AROM in wrist to [facility] without increase symptoms     Baseline  AROM decrease in all planes with flexion, ext, RD, sup worse and pain increased to 4/10 with RD     Status  Achieved        OT Long Term Goals - 11/14/18 0954      OT LONG TERM GOAL #1   Title  R wrist strength increase to 4+/5 to be able to lift , pull , push more than  5 lbs without symptoms     Baseline  pt feels 3 lbs with RD, UD at end of 2nd set - pt not to force - did not feel wrist with theraband for elbow and shoulder - that is about 7lbs and do not feel with putty     Time  4    Period  Weeks    Status  On-going    Target Date  12/12/18      OT LONG TERM GOAL #2   Title  Grip and prehension  strength in the R increase to more than 50% compare to L to return to prior level of function and no increase symptoms     Baseline  R hand 32, L 65 lbs 4 wks ago -and now 80 lbs for bilateral grip     Status  Achieved      OT LONG TERM GOAL #3   Title  Function score on PRWHE improve wiht more than 25 points     Baseline  at eval PRWHE function score 46/50 , improve to 28/50 - to be assess next visit    Time  2    Period  Weeks  Target Date  11/28/18      OT LONG TERM GOAL #4   Title  Will add work tasks to program as patient progresses    Baseline  simulate work act - 7 -14 lbs with therapy band     Time  5    Period  Weeks    Status  On-going    Target Date  12/19/18            Plan - 11/14/18 0900    Clinical Impression Statement  Pt is about 10 wks s/p from Wrist TFCC surgery and debridement - pt showed great progress in ROM , grip /prehension strength-  pt doing great with strengtening for wrist - but do not have the act tolerance yet - and some soreness with 3 lbs for wrist - if forcing - pt to do  strengthening in comfortable level and discuss to work on increase sets - before able to upgrade - pt to see surgeon next week  -    Occupational performance deficits (Please refer to evaluation for details):  ADL's;IADL's;Work;Leisure    Rehab Potential  Good    Clinical Decision Making  Several treatment options, min-mod task modification necessary    Modification or Assistance to Complete Evaluation   No modification of tasks or assist necessary to complete eval    OT Frequency  1x / week    OT Duration  2 weeks    OT Treatment/Interventions  Self-care/ADL training;Therapeutic exercise;Moist Heat;Paraffin;Splinting;Patient/family education;Fluidtherapy;Contrast Bath;Manual Therapy;Passive range of motion    Plan  assess progress with 3 lbs weight and dark firm putty - and band     OT Home Exercise Plan  see pt instruction    Consulted and Agree with Plan of Care  Patient       Patient will benefit from skilled therapeutic intervention in order to improve the following deficits and impairments:     Visit Diagnosis: Muscle weakness (generalized)  TFCC (triangular fibrocartilage complex) injury, right, initial encounter  Stiffness of right wrist joint  Pain in right wrist  Other lack of coordination    Problem List Patient Active Problem List   Diagnosis Date Noted  . Abscess of axilla, right 09/09/2015  . Abortion, spontaneous 05/31/2015  . Edema leg 05/31/2015  . Chest discomfort 05/31/2015  . Cough 05/31/2015  . Diabetes mellitus ([place]) 05/31/2015  . Accumulation of fluid in tissues 05/31/2015  . Big thyroid 05/31/2015  . Cardiac murmur 05/31/2015  . Hot flash, menopausal 05/31/2015  . LBP (low back pain) 05/31/2015  . Acute thoracic back pain 05/31/2015  . Multinodular goiter 05/31/2015  . Adiposity 05/31/2015  . Abnormal blood sugar 05/31/2015  . Cutaneous eruption 05/31/2015  . Avitaminosis D 05/31/2015  . Anxiety 02/15/2015  . Accessory skin tags 11/29/2014  .  Esophagitis, reflux 04/27/2008  . Other general symptoms and signs 10/05/2006  . Pure hypercholesterolemia 09/29/2006  . 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 Acute stress disorder 02/18/2006  . 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 Bone/cartilage disorder 07/29/2005  . Benign essential HTN 07/10/2005    Rosalyn Gess OTR/L,CLT 11/14/2018, 9:57 AM  [facility] [address] [place], [address] Phone: [phone]   Fax:  [phone]  Name: Miranda Moore MRN: [id number] Date of Birth: 1961-01-27

## 2018-11-20 ENCOUNTER — Other Ambulatory Visit: Payer: Self-pay | Admitting: Family Medicine

## 2018-11-20 DIAGNOSIS — R600 Localized edema: Secondary | ICD-10-CM

## 2018-11-24 ENCOUNTER — Ambulatory Visit: Payer: PRIVATE HEALTH INSURANCE | Attending: Orthopedic Surgery | Admitting: Occupational Therapy

## 2018-11-24 ENCOUNTER — Other Ambulatory Visit: Payer: Self-pay

## 2018-11-24 DIAGNOSIS — M25631 Stiffness of right wrist, not elsewhere classified: Secondary | ICD-10-CM | POA: Insufficient documentation

## 2018-11-24 DIAGNOSIS — M6281 Muscle weakness (generalized): Secondary | ICD-10-CM | POA: Insufficient documentation

## 2018-11-24 DIAGNOSIS — S6981XA Other specified injuries of right wrist, hand and finger(s), initial encounter: Secondary | ICD-10-CM | POA: Diagnosis present

## 2018-11-24 DIAGNOSIS — R278 Other lack of coordination: Secondary | ICD-10-CM | POA: Insufficient documentation

## 2018-11-24 DIAGNOSIS — M25531 Pain in right wrist: Secondary | ICD-10-CM | POA: Diagnosis present

## 2018-11-24 NOTE — Therapy (Signed)
[facility] [address], [place], [address] Phone: [phone]   Fax:  [phone]  Occupational Therapy Treatment  Patient Details  Name: Miranda Moore MRN: [id number] Date of Birth: [birth year] No data recorded  Encounter Date: 11/24/2018  OT End of Session - 11/24/18 1607    Visit Number  10    Number of Visits  12    Date for OT Re-Evaluation  12/19/18    Authorization Type  workers Chief [employer] - Visit Number  10    Authorization - Number of Visits  12    OT Start Time  1500    OT Stop Time  1539    OT Time Calculation (min)  39 min    Activity Tolerance  Patient tolerated treatment well    Behavior During Therapy  [facility] for tasks assessed/performed       Past Medical History:  Diagnosis Date  . Anxiety   . Depression   . GERD (gastroesophageal reflux disease)   . Hypertension   . MRSA infection 2007   left lower leg  . Pre-diabetes   . Sleep apnea    uses CPAP nightly    Past Surgical History:  Procedure Laterality Date  . ABDOMINAL HYSTERECTOMY  2002  . APPENDECTOMY  2013   laparascopic with removal of tubal remnant  . COLONOSCOPY  2015  . KNEE SURGERY  2012  . LAPAROSCOPIC OOPHORECTOMY  2012  . WRIST ARTHROSCOPY WITH DEBRIDEMENT Right 08/30/2018   Procedure: WRIST ARTHROSCOPY WITH DEBRIDEMENT AND SYNOVECTOMY;  Surgeon: Roseanne Kaufman, MD;  Location: [facility];  Service: Orthopedics;  Laterality: Right;    There were no vitals filed for this visit.  Subjective Assessment - 11/24/18 1602    Subjective   I seen Dr Herbert Seta - and he kept me about 5-15 lbs and to wear my little widget strap - but then Tuesday my wrist started hurting - I did my exercises with the 3 lbs weight and putty - the twisting - I can feel it on that side  - I did not do my exercises as much last week     Patient Stated Goals  I want to be able to get my wrist ROM and strength  back to use it like before without pain     Currently in Pain?  Yes    Pain Score  3     Pain Location  Wrist    Pain Orientation  Right    Pain Descriptors / Indicators  Aching;Sore    Pain Type  Surgical pain    Pain Onset  In the past 7 days    Aggravating Factors   palpation , wrist flexion and UD               Pt report some pain on ulnar side of wrist - tenderness since the day after her appt with surgeon   Don't know if she done something or tweaked her wrist      OT Treatments/Exercises (OP) - 11/24/18 0001      Ultrasound   Ultrasound Location  ulnar wrist R     Ultrasound Parameters  3.3MHZ 20 %, 0.8 intensity     Ultrasound Goals  Pain      RUE Paraffin   Number Minutes Paraffin  10 Minutes    RUE Paraffin Location  Wrist    Comments  decrease pain  Pt did not decrease to 0/10 after paraffin - decrease to 1-2/10    Pt was the last 2 wks suppose to work daily on    3 lbs weight - but do 2 sets and then 3rd set with 2 lbs  12 reps  Do not force RD ,UD end ranges - pt was pushing and forcing end range - causing some discomfort the previous week   Putty dark firm blue - grip , pulling and twisting- cont same HEP   Blue TB - cont with scapula retraction, shoulder extention Simulate pushing bands into protraction Simulate with above rolling or pushing person over in bed   But appear pt was not able to do HEP last week and done these the last 2 days and had increase soreness the day after her surgeon visit - pt about 2-3/10 on ulnar wrist   Reinforce with pt again - that she need to work consistently on HEP - for act tolerance - cannot do it sporadic - she was in pain for year and got weak on R UE   Pt to hold off on any resistance HEP and wear her wris strap or wrap - to decrease pain with functional use or ROM the next few days        OT Education - 11/24/18 1606    Education Details  hold off on any resistance HEP until next appt      Person(s) Educated  Patient    Methods  Explanation;Demonstration;Handout    Comprehension  Verbalized understanding;Returned demonstration       OT Short Term Goals - 11/14/18 0953      OT SHORT TERM GOAL #1   Title  Pain on PRWHE improve to less than 8/50 with use in ADL's and IADL's     Baseline  pain score on PRWHE 24/50 at eval - and now 0/10  - no pain with use - lmited to less than 10 lbs     Status  Achieved      OT SHORT TERM GOAL #2   Title  Pt to be ind in HEP to increase AROM in wrist to [facility] without increase symptoms     Baseline  AROM decrease in all planes with flexion, ext, RD, sup worse and pain increased to 4/10 with RD     Status  Achieved        OT Long Term Goals - 11/14/18 0954      OT LONG TERM GOAL #1   Title  R wrist strength increase to 4+/5 to be able to lift , pull , push more than  5 lbs without symptoms     Baseline  pt feels 3 lbs with RD, UD at end of 2nd set - pt not to force - did not feel wrist with theraband for elbow and shoulder - that is about 7lbs and do not feel with putty     Time  4    Period  Weeks    Status  On-going    Target Date  12/12/18      OT LONG TERM GOAL #2   Title  Grip and prehension  strength in the R increase to more than 50% compare to L to return to prior level of function and no increase symptoms     Baseline  R hand 32, L 65 lbs 4 wks ago -and now 80 lbs for bilateral grip     Status  Achieved  OT LONG TERM GOAL #3   Title  Function score on PRWHE improve wiht more than 25 points     Baseline  at eval PRWHE function score 46/50 , improve to 28/50 - to be assess next visit    Time  2    Period  Weeks    Target Date  11/28/18      OT LONG TERM GOAL #4   Title  Will add work tasks to program as patient progresses    Baseline  simulate work act - 7 -14 lbs with therapy band     Time  5    Period  Weeks    Status  On-going    Target Date  12/19/18            Plan - 11/24/18 1609    Clinical  Impression Statement  Pt is 12 wks wks s/p R wrist TFCC surgery and debridement - pt was doing great and seen surgeon this past Monday - pt report she done all her HEP with 3 lbs weight and firm putty - and since Tues has some pain about 2-3/10 on ulnar wrist - pt to hold off on HEP - she probable over done it this week - prior to her appt -and 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 to increase act tolerance  last week - pt to hold off on any resistance the next few days and pain free ROM and functional use     Occupational performance deficits (Please refer to evaluation for details):  ADL's;IADL's;Work;Leisure    Rehab Potential  Good    Clinical Decision Making  Several treatment options, min-mod task modification necessary    Modification or Assistance to Complete Evaluation   No modification of tasks or assist necessary to complete eval    OT Frequency  1x / week    OT Duration  2 weeks    OT Treatment/Interventions  Self-care/ADL training;Therapeutic exercise;Moist Heat;Paraffin;Splinting;Patient/family education;Fluidtherapy;Contrast Bath;Manual Therapy;Passive range of motion    Plan  assess pain with holding off on resistance HEP     OT Home Exercise Plan  see pt instruction    Consulted and Agree with Plan of Care  Patient       Patient will benefit from skilled therapeutic intervention in order to improve the following deficits and impairments:     Visit Diagnosis: Muscle weakness (generalized)  TFCC (triangular fibrocartilage complex) injury, right, initial encounter  Stiffness of right wrist joint  Pain in right wrist  Other lack of coordination    Problem List Patient Active Problem List   Diagnosis Date Noted  . Abscess of axilla, right 09/09/2015  . Abortion, spontaneous 05/31/2015  . Edema leg 05/31/2015  . Chest discomfort 05/31/2015  . Cough 05/31/2015  . Diabetes mellitus ([place]) 05/31/2015  . Accumulation of fluid in tissues 05/31/2015  . Big thyroid  05/31/2015  . Cardiac murmur 05/31/2015  . Hot flash, menopausal 05/31/2015  . LBP (low back pain) 05/31/2015  . Acute thoracic back pain 05/31/2015  . Multinodular goiter 05/31/2015  . Adiposity 05/31/2015  . Abnormal blood sugar 05/31/2015  . Cutaneous eruption 05/31/2015  . Avitaminosis D 05/31/2015  . Anxiety 02/15/2015  . Accessory skin tags 11/29/2014  . Esophagitis, reflux 04/27/2008  . Other general symptoms and signs 10/05/2006  . Pure hypercholesterolemia 09/29/2006  . 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 Acute stress disorder 02/18/2006  . 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 Bone/cartilage disorder 07/29/2005  .  Benign essential HTN 07/10/2005    Rosalyn Gess OTR/L,CLT 11/24/2018, 4:18 PM  [facility] [address] [place], [address] Phone: [phone]   Fax:  [phone]  Name: Miranda Moore MRN: [id number] Date of Birth: 1961/04/08

## 2018-11-24 NOTE — Patient Instructions (Signed)
Pt to hold off on any exercises - wear strap  Or soft wrist splint

## 2018-11-28 ENCOUNTER — Other Ambulatory Visit: Payer: Self-pay | Admitting: *Deleted

## 2018-11-28 DIAGNOSIS — F439 Reaction to severe stress, unspecified: Secondary | ICD-10-CM

## 2018-11-28 MED ORDER — ALPRAZOLAM 0.5 MG PO TABS: ORAL_TABLET | ORAL | 1 refills | Status: DC

## 2018-11-29 ENCOUNTER — Ambulatory Visit: Payer: PRIVATE HEALTH INSURANCE | Admitting: Occupational Therapy

## 2018-11-29 ENCOUNTER — Other Ambulatory Visit: Payer: Self-pay

## 2018-11-29 DIAGNOSIS — S6981XA Other specified injuries of right wrist, hand and finger(s), initial encounter: Secondary | ICD-10-CM

## 2018-11-29 DIAGNOSIS — M6281 Muscle weakness (generalized): Secondary | ICD-10-CM | POA: Diagnosis not present

## 2018-11-29 DIAGNOSIS — R278 Other lack of coordination: Secondary | ICD-10-CM

## 2018-11-29 DIAGNOSIS — M25531 Pain in right wrist: Secondary | ICD-10-CM

## 2018-11-29 DIAGNOSIS — M25631 Stiffness of right wrist, not elsewhere classified: Secondary | ICD-10-CM

## 2018-11-29 NOTE — Patient Instructions (Signed)
Wear Benik neoprene splint during day to decrease pain -and only off 2-3 x day for pain free AROM for wrist in all planes  Contact casemanager for appt with surgeon

## 2018-11-29 NOTE — Therapy (Signed)
[facility] [address] [place], [address] Phone: [phone]   Fax:  [phone]  Occupational Therapy Treatment  Patient Details  Name: Miranda Moore MRN: [id number] Date of Birth: 1960-09-26 No data recorded  Encounter Date: 11/29/2018  OT End of Session - 11/29/18 1845    Visit Number  11    Number of Visits  12    Date for OT Re-Evaluation  12/19/18    Authorization Type  workers Chief Executive Officer - Visit Number  11    Authorization - Number of Visits  12    OT Start Time  1400    OT Stop Time  1431    OT Time Calculation (min)  31 min    Activity Tolerance  Patient tolerated treatment well    Behavior During Therapy  [facility] for tasks assessed/performed       Past Medical History:  Diagnosis Date  . Anxiety   . Depression   . GERD (gastroesophageal reflux disease)   . Hypertension   . MRSA infection 2007   left lower leg  . Pre-diabetes   . Sleep apnea    uses CPAP nightly    Past Surgical History:  Procedure Laterality Date  . ABDOMINAL HYSTERECTOMY  2002  . APPENDECTOMY  2013   laparascopic with removal of tubal remnant  . COLONOSCOPY  2015  . KNEE SURGERY  2012  . LAPAROSCOPIC OOPHORECTOMY  2012  . WRIST ARTHROSCOPY WITH DEBRIDEMENT Right 08/30/2018   Procedure: WRIST ARTHROSCOPY WITH DEBRIDEMENT AND SYNOVECTOMY;  Surgeon: Roseanne Kaufman, MD;  Location: [facility];  Service: Orthopedics;  Laterality: Right;    There were no vitals filed for this visit.  Subjective Assessment - 11/29/18 1451    Subjective   It is still sore - on that side at that bone with certain movement -it started last Tues after I seen the surgeon     Patient Stated Goals  I want to be able to get my wrist ROM and strength back to use it like before without pain     Currently in Pain?  Yes    Pain Score  3     Pain Location  Wrist    Pain Orientation  Right    Pain Descriptors /  Indicators  Aching;Sore;Tender    Pain Type  Acute pain;Surgical pain    Pain Onset  In the past 7 days    Pain Frequency  Intermittent         Pain or discomfort over ulnar wrist - ECU? Or ligament - was tender dorsal wrist medial to ulnar styloid  2-3/10 with wrist extention and UD  And slight pull with wrist flexion             OT Treatments/Exercises (OP) - 11/29/18 0001      Ultrasound   Ultrasound Location  ulnar wrist R     Ultrasound Parameters  3.3MHZ 20 % , 0.8     Ultrasound Goals  Pain      RUE Paraffin   Number Minutes Paraffin  10 Minutes    RUE Paraffin Location  Wrist    Comments  decrease pain with increase ROM       discomfort decrease after paraffin but still present  Pt fitted with Benik wrist neoprene splint to wear - and only off with pain free AROM for wrist 2-3 x day  Contact her casemanager for appt  with Dr Amedeo Plenty to rule out ligament involvement      Pt had been doing for the last 2 wks the follow HEP   3 lbs weight - but to  do 2 sets and then 3rd set with 2 lbs  12 reps  Was ed on not force RD ,UD end ranges- pt was pushing and forcing end range - causing some discomfortthe previous week    WPutty dark firm blue - grip , pulling and twisting- cont same HEP   BlueTB - cont with scapula retraction, shoulder extention Simulate pushing bands into protraction Simulate with above rolling or pushing person over in bed  But appear pt was not able to do HEP always in due to 12 hrs shifts or funeral 2-3 wks ago  This past  week after appt with surgeon she had  increase soreness the day after her surgeon visit - pt about 2-3/10 on ulnar wrist  - after stepping up her HEP and focussed on it   Reinforce with pt again - that she need to work consistently on HEP - for act tolerance - cannot do it sporadic - she was in pain for year and got weak on R UE   pt to hold off on any resistance since last week - to see surgeon for assessment      OT Education - 11/29/18 1844    Education Details  hold off on any resistance HEP , pain free AROM , Benik splint on and get appt with surgeon to assess    Person(s) Educated  Patient    Methods  Explanation;Demonstration;Handout    Comprehension  Verbalized understanding;Returned demonstration       OT Short Term Goals - 11/14/18 0953      OT SHORT TERM GOAL #1   Title  Pain on PRWHE improve to less than 8/50 with use in ADL's and IADL's     Baseline  pain score on PRWHE 24/50 at eval - and now 0/10  - no pain with use - lmited to less than 10 lbs     Status  Achieved      OT SHORT TERM GOAL #2   Title  Pt to be ind in HEP to increase AROM in wrist to [facility] without increase symptoms     Baseline  AROM decrease in all planes with flexion, ext, RD, sup worse and pain increased to 4/10 with RD     Status  Achieved        OT Long Term Goals - 11/14/18 0954      OT LONG TERM GOAL #1   Title  R wrist strength increase to 4+/5 to be able to lift , pull , push more than  5 lbs without symptoms     Baseline  pt feels 3 lbs with RD, UD at end of 2nd set - pt not to force - did not feel wrist with theraband for elbow and shoulder - that is about 7lbs and do not feel with putty     Time  4    Period  Weeks    Status  On-going    Target Date  12/12/18      OT LONG TERM GOAL #2   Title  Grip and prehension  strength in the R increase to more than 50% compare to L to return to prior level of function and no increase symptoms     Baseline  R hand 32, L 65 lbs 4 wks ago -  and now 80 lbs for bilateral grip     Status  Achieved      OT LONG TERM GOAL #3   Title  Function score on PRWHE improve wiht more than 25 points     Baseline  at eval PRWHE function score 46/50 , improve to 28/50 - to be assess next visit    Time  2    Period  Weeks    Target Date  11/28/18      OT LONG TERM GOAL #4   Title  Will add work tasks to program as patient progresses    Baseline  simulate work act - 7  -14 lbs with therapy band     Time  5    Period  Weeks    Status  On-going    Target Date  12/19/18            Plan - 11/29/18 1846    Clinical Impression Statement  Pt is about 13 wks s/p R wrist TFCC surgery and debridement - pt seen her surgeon last Tues - and when she come in Thursday pt report she had increase discomfort since Tues - pt report she was doing her usual 2-3 lbs weight for wrist and putty HEP - but increase to sets and reps because of her appt on Thursday with this OT and has harder time on her 12 hrs days to do her HEP - pt tender around ulnar styloid and some pain with UD and wrist extention, slight pull with wrist flexion  ; ? ECU irritation or ligament - pt to contact her casemanager to get appt with Dr Amedeo Plenty to assess - pt to hold off on any strengthening since last week and only do AROM pain free and pt fitted this date with Benik neoprene splint     Occupational performance deficits (Please refer to evaluation for details):  ADL's;IADL's;Work;Leisure    Rehab Potential  Good    Clinical Decision Making  Several treatment options, min-mod task modification necessary    Modification or Assistance to Complete Evaluation   No modification of tasks or assist necessary to complete eval    OT Frequency  1x / week    OT Duration  2 weeks    OT Treatment/Interventions  Self-care/ADL training;Therapeutic exercise;Moist Heat;Paraffin;Splinting;Patient/family education;Fluidtherapy;Contrast Bath;Manual Therapy;Passive range of motion    Plan  assess pain with holding off on resistance HEP - and pt to contact me after getting appt with Dr Amedeo Plenty    OT Home Exercise Plan  see pt instruction    Consulted and Agree with Plan of Care  Patient       Patient will benefit from skilled therapeutic intervention in order to improve the following deficits and impairments:     Visit Diagnosis: Muscle weakness (generalized)  TFCC (triangular fibrocartilage complex) injury, right,  initial encounter  Stiffness of right wrist joint  Pain in right wrist  Other lack of coordination    Problem List Patient Active Problem List   Diagnosis Date Noted  . Abscess of axilla, right 09/09/2015  . Abortion, spontaneous 05/31/2015  . Edema leg 05/31/2015  . Chest discomfort 05/31/2015  . Cough 05/31/2015  . Diabetes mellitus ([place]) 05/31/2015  . Accumulation of fluid in tissues 05/31/2015  . Big thyroid 05/31/2015  . Cardiac murmur 05/31/2015  . Hot flash, menopausal 05/31/2015  . LBP (low back pain) 05/31/2015  . Acute thoracic back pain 05/31/2015  . Multinodular goiter 05/31/2015  . Adiposity 05/31/2015  .  Abnormal blood sugar 05/31/2015  . Cutaneous eruption 05/31/2015  . Avitaminosis D 05/31/2015  . Anxiety 02/15/2015  . Accessory skin tags 11/29/2014  . Esophagitis, reflux 04/27/2008  . Other general symptoms and signs 10/05/2006  . Pure hypercholesterolemia 09/29/2006  . 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 Acute stress disorder 02/18/2006  . 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 Bone/cartilage disorder 07/29/2005  . Benign essential HTN 07/10/2005    Rosalyn Gess OTR/L,CLT 11/29/2018, 6:51 PM  [facility] [address], [place], [address] Phone: [phone]   Fax:  [phone]  Name: AALAYAH RILES MRN: [id number] Date of Birth: August 12, 1960

## 2018-12-07 ENCOUNTER — Ambulatory Visit: Payer: PRIVATE HEALTH INSURANCE | Admitting: Occupational Therapy

## 2018-12-07 ENCOUNTER — Other Ambulatory Visit: Payer: Self-pay

## 2018-12-07 DIAGNOSIS — M6281 Muscle weakness (generalized): Secondary | ICD-10-CM

## 2018-12-07 DIAGNOSIS — R278 Other lack of coordination: Secondary | ICD-10-CM

## 2018-12-07 DIAGNOSIS — S6981XA Other specified injuries of right wrist, hand and finger(s), initial encounter: Secondary | ICD-10-CM

## 2018-12-07 DIAGNOSIS — M25631 Stiffness of right wrist, not elsewhere classified: Secondary | ICD-10-CM

## 2018-12-07 DIAGNOSIS — M25531 Pain in right wrist: Secondary | ICD-10-CM

## 2018-12-07 NOTE — Therapy (Signed)
[facility] [address], [place], [address] Phone: [phone]   Fax:  [phone]  Occupational Therapy Treatment  Patient Details  Name: Miranda Moore MRN: [id number] Date of Birth: 1961/03/27 No data recorded  Encounter Date: 12/07/2018  OT End of Session - 12/07/18 1556    Visit Number  12    Number of Visits  16    Date for OT Re-Evaluation  01/04/19    Authorization Type  workers Chief Executive Officer - Visit Number  12    Authorization - Number of Visits  16    OT Start Time  1504    OT Stop Time  1545    OT Time Calculation (min)  41 min    Activity Tolerance  Patient tolerated treatment well    Behavior During Therapy  [facility] for tasks assessed/performed       Past Medical History:  Diagnosis Date  . Anxiety   . Depression   . GERD (gastroesophageal reflux disease)   . Hypertension   . MRSA infection 2007   left lower leg  . Pre-diabetes   . Sleep apnea    uses CPAP nightly    Past Surgical History:  Procedure Laterality Date  . ABDOMINAL HYSTERECTOMY  2002  . APPENDECTOMY  2013   laparascopic with removal of tubal remnant  . COLONOSCOPY  2015  . KNEE SURGERY  2012  . LAPAROSCOPIC OOPHORECTOMY  2012  . WRIST ARTHROSCOPY WITH DEBRIDEMENT Right 08/30/2018   Procedure: WRIST ARTHROSCOPY WITH DEBRIDEMENT AND SYNOVECTOMY;  Surgeon: Roseanne Kaufman, MD;  Location: [facility];  Service: Orthopedics;  Laterality: Right;    There were no vitals filed for this visit.  Subjective Assessment - 12/07/18 1553    Subjective   It feels better - no pain - I wore the blue soft splint like you told me but took it of the last 2 days - but did not do anything with the weight or putty yet     Currently in Pain?  No/denies    Pain Score  0-No pain         [facility] OT Assessment - 12/07/18 0001      AROM   Right Wrist Extension  72 Degrees    Right Wrist Flexion  78 Degrees       Strength   Right Hand Grip (lbs)  60    Right Hand Lateral Pinch  23 lbs    Right Hand 3 Point Pinch  15 lbs    Left Hand Grip (lbs)  80    Left Hand Lateral Pinch  24 lbs    Left Hand 3 Point Pinch  15 lbs          Pain free AROM to R wrist in all planes And grip and prehension strength- grip decrease 20 lbs compare to 3 wks ago  Not tenderness this date on ulnar side or pain with resistance to wrist ext , RD     OT Treatments/Exercises (OP) - 12/07/18 0001      RUE Fluidotherapy   Number Minutes Fluidotherapy  10 Minutes    RUE Fluidotherapy Location  Wrist    Comments  AROM for wrist flexion , extention prior to review of HEP        Pt to cont with Gentle PROM for wrist flexion 10 reps hold 10 sec  And then decrease pt to   Weight 1 lbs  for wrist flexion, ext, RD, UD  2 lbs for sup /pro  10-12 reps   2 x day   Putty decrease to teal and only gripping 12-15  2 x day  Increase in 3 days to 2 sets   2 x day and  6 days 3 sets 2 x day  Pain free  Reinforce to do HEP daily and gradually increase strength        OT Education - 12/07/18 1556    Education Details  progress, findings of reassessment and new HEP     Person(s) Educated  Patient    Methods  Explanation;Demonstration;Handout    Comprehension  Verbalized understanding;Returned demonstration       OT Short Term Goals - 12/07/18 1600      OT SHORT TERM GOAL #1   Title  Pain on PRWHE improve to less than 8/50 with use in ADL's and IADL's     Baseline  pain score on PRWHE 24/50 at eval - and  was 3 wks ago 0/10  - but pain increase again last 2 wks 3-4/10     Time  2    Period  Weeks    Status  On-going    Target Date  12/21/18      OT SHORT TERM GOAL #2   Title  Pt to be ind in HEP to increase AROM in wrist to [facility] without increase symptoms     Status  Achieved        OT Long Term Goals - 12/07/18 1601      OT LONG TERM GOAL #1   Title  R wrist strength increase to 4+/5 to be able to  lift , pull , push more than  5 lbs without symptoms     Baseline  Pt over done her HEP 2 wks ago -with 3 lbs and had increase symptoms - will gradually increase strength this date with 1 lbs and teal med putty - 1 set and increase this next week 3 sets     Time  4    Period  Weeks    Status  On-going    Target Date  01/04/19      OT LONG TERM GOAL #2   Title  Grip and prehension  strength in the R increase to more than 50% compare to L to return to prior level of function and no increase symptoms     Baseline  grip improved greatly 3 wks ago - but had set back with pain at ECU - grip this date 60R and L 80 lbs     Time  4    Period  Weeks    Status  On-going    Target Date  01/04/19      OT LONG TERM GOAL #3   Title  Function score on PRWHE improve wiht more than 25 points     Baseline  at eval PRWHE function score 46/50 , improve to 28/50 - to be assess next visit    Time  4    Period  Weeks    Status  On-going    Target Date  01/04/19      OT LONG TERM GOAL #4   Title  Will add work tasks to program as patient progresses    Baseline  simulate as strength increase     Time  4    Period  Weeks    Status  On-going    Target Date  01/04/19  Plan - 12/07/18 1557    Clinical Impression Statement  Pt is about 14 wks s/p R wrist TFCC surgery and debridement - pt had increase pain the last 2 wks after over doing her HEP and appear aggrivated her ECU - pt present to day pain free for few day - reinforce for pt to do her HEP daily and increase sets and weight  as per HEP - otherwise she do not condition her R UE correctly - her injury was for about year and R UE really got weak in that time - her grip was decrease by 20 lbs today compare to 3 wks ago - will gradually increase her strength again until appt with surgeon in month     Occupational performance deficits (Please refer to evaluation for details):  ADL's;IADL's;Work;Leisure    Rehab Potential  Good    Clinical  Decision Making  Several treatment options, min-mod task modification necessary    Modification or Assistance to Complete Evaluation   No modification of tasks or assist necessary to complete eval    OT Frequency  1x / week    OT Duration  4 weeks    OT Treatment/Interventions  Self-care/ADL training;Therapeutic exercise;Moist Heat;Paraffin;Splinting;Patient/family education;Fluidtherapy;Contrast Bath;Manual Therapy;Passive range of motion    Plan  cont to increase strength gradually     OT Home Exercise Plan  see pt instruction    Consulted and Agree with Plan of Care  Patient       Patient will benefit from skilled therapeutic intervention in order to improve the following deficits and impairments:     Visit Diagnosis: Muscle weakness (generalized) - Plan: Ot plan of care cert/re-cert  TFCC (triangular fibrocartilage complex) injury, right, initial encounter - Plan: Ot plan of care cert/re-cert  Stiffness of right wrist joint - Plan: Ot plan of care cert/re-cert  Pain in right wrist - Plan: Ot plan of care cert/re-cert  Other lack of coordination - Plan: Ot plan of care cert/re-cert    Problem List Patient Active Problem List   Diagnosis Date Noted  . Abscess of axilla, right 09/09/2015  . Abortion, spontaneous 05/31/2015  . Edema leg 05/31/2015  . Chest discomfort 05/31/2015  . Cough 05/31/2015  . Diabetes mellitus ([place]) 05/31/2015  . Accumulation of fluid in tissues 05/31/2015  . Big thyroid 05/31/2015  . Cardiac murmur 05/31/2015  . Hot flash, menopausal 05/31/2015  . LBP (low back pain) 05/31/2015  . Acute thoracic back pain 05/31/2015  . Multinodular goiter 05/31/2015  . Adiposity 05/31/2015  . Abnormal blood sugar 05/31/2015  . Cutaneous eruption 05/31/2015  . Avitaminosis D 05/31/2015  . Anxiety 02/15/2015  . Accessory skin tags 11/29/2014  . Esophagitis, reflux 04/27/2008  . Other general symptoms and signs 10/05/2006  . Pure hypercholesterolemia 09/29/2006   . 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 Acute stress disorder 02/18/2006  . 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 Bone/cartilage disorder 07/29/2005  . Benign essential HTN 07/10/2005    Rosalyn Gess OTR/l,CLT 12/07/2018, 4:05 PM  [place] [address], [place], [address] Phone: [phone]   Fax:  [phone]  Name: Miranda Moore MRN: [id number] Date of Birth: 1960/10/28

## 2018-12-07 NOTE — Patient Instructions (Signed)
Gentle PROM for wrist flexion 10 reps hold 10 sec  Weight 1 lbs for wrist flexion, ext, RD, UD  2 lbs for sup /pro  10-12 reps   2 x day  Putty decrease to teal and only gripping 12-15  2 x day  Increase in 3 days to 2 sets   2 x day and  6 days 3 sets 2 x day  Pain free

## 2018-12-13 ENCOUNTER — Other Ambulatory Visit: Payer: Self-pay

## 2018-12-13 ENCOUNTER — Ambulatory Visit (INDEPENDENT_AMBULATORY_CARE_PROVIDER_SITE_OTHER): Payer: BC Managed Care – PPO | Admitting: General Surgery

## 2018-12-13 ENCOUNTER — Encounter: Payer: Self-pay | Admitting: General Surgery

## 2018-12-13 VITALS — BP 118/69 | HR 97 | Temp 97.3°F | Resp 16 | Ht 69.0 in | Wt 249.4 lb

## 2018-12-13 DIAGNOSIS — L723 Sebaceous cyst: Secondary | ICD-10-CM

## 2018-12-13 DIAGNOSIS — L089 Local infection of the skin and subcutaneous tissue, unspecified: Secondary | ICD-10-CM

## 2018-12-13 NOTE — Progress Notes (Signed)
Patient ID: Miranda Moore, female   DOB: [birth year], 58 y.o.   MRN: [id number]  Chief Complaint  Patient presents with  . Procedure    cyst on back excision    HPI Miranda Moore is a 58 y.o. female.  Here today for excision of cyst on back.  The patient reports this became inflamed in late February/early March.  She was seen by her PCP on March 3 and underwent incision and drainage.  Since that time the area has improved but is still sore and tender.  No further drainage.  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 HPI  Past Medical History:  Diagnosis Date  . Anxiety   . Depression   . GERD (gastroesophageal reflux disease)   . Hypertension   . MRSA infection 2007   left lower leg  . Pre-diabetes   . Sleep apnea    uses CPAP nightly    Past Surgical History:  Procedure Laterality Date  . ABDOMINAL HYSTERECTOMY  2002  . APPENDECTOMY  2013   laparascopic with removal of tubal remnant  . COLONOSCOPY  2015  . KNEE SURGERY  2012  . LAPAROSCOPIC OOPHORECTOMY  2012  . WRIST ARTHROSCOPY WITH DEBRIDEMENT Right 08/30/2018   Procedure: WRIST ARTHROSCOPY WITH DEBRIDEMENT AND SYNOVECTOMY;  Surgeon: Roseanne Kaufman, MD;  Location: [facility];  Service: Orthopedics;  Laterality: Right;    Family History  Problem Relation Age of Onset  . Cancer Mother        colon  . Heart failure Mother   . Heart disease Father   . Hypertension Father   . Hypertension Brother   . Heart failure Paternal Grandmother   . Dementia Paternal Grandfather   . Breast cancer Paternal Aunt     Social History Social History   Tobacco Use  . Smoking status: Never Smoker  . Smokeless tobacco: Never Used  Substance Use Topics  . Alcohol use: Yes    Alcohol/week: 0.0 standard drinks    Comment: 2-4/week  . Drug use: No    Allergies   Allergen Reactions  . Chlorpheniramine-Phenylephrine Hives  . Other Hives    actifed  . Sulfa Antibiotics   . Sulfur Other (See Comments)    Low b/p    Current Outpatient Medications  Medication Sig Dispense Refill  . ALPRAZolam (XANAX) 0.5 MG tablet TAKE 1-2 TABLETS BY MOUTH 3 TIMES DAILY PRN FOR ANXIETY STRESS 90 tablet 1  . amLODIPine-Valsartan-HCTZ 10-160-12.5 MG TABS TAKE 1 TABLET DAILY FOR    HYPERTENSION 90 tablet 3  . aspirin 81 MG tablet Take 81 mg by mouth daily.    . Calcium Carbonate-Vitamin D 600-200 MG-UNIT TABS     . Cholecalciferol (VITAMIN D-3) 1000 UNITS CAPS Take by mouth daily.    . furosemide (LASIX) 20 MG tablet TAKE 1 TABLET DAILY AS     NEEDED FOR EDEMA 90 tablet 1  . Omega-3 Fatty Acids (FISH OIL DOUBLE STRENGTH) 1200 MG CAPS Take by mouth.    Marland Kitchen omeprazole (PRILOSEC) 40 MG capsule TAKE 1 CAPSULE DAILY FOR   REFLUX ESOPHAGITIS WITH    GASTRITIS 90 capsule 3  . POTASSIUM CITRATE PO Take by mouth daily.    . sertraline (ZOLOFT) 50 MG tablet TAKE 1 TABLET DAILY 90 tablet 3   No current facility-administered  medications for this visit.     Review of Systems Review of Systems  Constitutional: Negative.   Respiratory: Negative.   Cardiovascular: Negative.     Blood pressure 118/69, pulse 97, temperature (!) 97.3 F (36.3 C), temperature source Temporal, resp. rate 16, height 5\' 9"  (1.753 m), weight 249 lb 6.4 oz (113.1 kg), SpO2 97 %.  Physical Exam Physical Exam Cardiovascular:     Rate and Rhythm: Normal rate.  Pulmonary:     Effort: Pulmonary effort is normal.  Musculoskeletal:       Arms:  Skin:    General: Skin is warm and dry.  Neurological:     Mental Status: She is alert.     Data Reviewed The procedure was reviewed and the patient was amenable to proceed.  The area was cleansed with ChloraPrep followed by 20 cc of 0.5% Xylocaine with 0.25% Marcaine with 1 to 200,000 units of epinephrine.  The area was reprepped with ChloraPrep and  draped.  Through an elliptical incision the cyst was excised and the focal area of chronic granulation tissue on the inferior aspect sharply debrided.  Scant bleeding.  The wound was closed in 2 layers with interrupted 3-0 Vicryl figure-of-eight sutures.  The skin was closely approximated but not closed.  Telfa and Tegaderm dressing applied.  Wound care reviewed including changing the dressing daily starting in 24-48 hours.  Assessment Sebaceous cyst with residual chronic inflammatory tissue  Plan  The patient will contact the office if there is any concerns with wound healing.  She will have 1 of the hospital nurses look at the wound on Thursday for a quick check.  She is welcome return to the office at any time for reassessment.  HPI, Physical Exam, Assessment and Plan have been scribed under the direction and in the presence of Robert Bellow, MD. Jonnie Finner, CMA  I have completed the exam and reviewed the above documentation for accuracy and completeness.  I agree with the above.  Haematologist has been used and any errors in dictation or transcription are unintentional.  Hervey Ard, M.D., F.A.C.S.  Forest Gleason Ivadell Gaul 12/13/2018, 11:10 AM

## 2018-12-13 NOTE — Patient Instructions (Signed)
Please call the office if you have questions or concerns.  You may remove the dressing in 48 hours or sooner if the dressing becomes soiled.  You may Take Tylenol for the discomfort.

## 2018-12-14 ENCOUNTER — Ambulatory Visit: Payer: PRIVATE HEALTH INSURANCE | Attending: Orthopedic Surgery | Admitting: Occupational Therapy

## 2018-12-14 ENCOUNTER — Other Ambulatory Visit: Payer: Self-pay

## 2018-12-14 DIAGNOSIS — M6281 Muscle weakness (generalized): Secondary | ICD-10-CM | POA: Diagnosis present

## 2018-12-14 DIAGNOSIS — S6981XA Other specified injuries of right wrist, hand and finger(s), initial encounter: Secondary | ICD-10-CM | POA: Diagnosis present

## 2018-12-14 DIAGNOSIS — R278 Other lack of coordination: Secondary | ICD-10-CM | POA: Insufficient documentation

## 2018-12-14 DIAGNOSIS — M25531 Pain in right wrist: Secondary | ICD-10-CM | POA: Insufficient documentation

## 2018-12-14 DIAGNOSIS — M25631 Stiffness of right wrist, not elsewhere classified: Secondary | ICD-10-CM | POA: Insufficient documentation

## 2018-12-14 NOTE — Patient Instructions (Signed)
Upgrade to 2 lbs for wrist all planes - 3 sets for sup /pro  And 1 set of 12 for RD, UD, flexion , extention Upgrade to green putty for gripping 12 reps - 2 sets  And add pulling and twisting one set of 12  Increase a set in all in 3 days And another set 6 days prior to seeing me again  Pain free

## 2018-12-14 NOTE — Therapy (Signed)
[facility] [address], [place], [address] Phone: [phone]   Fax:  [phone]  Occupational Therapy Treatment  Patient Details  Name: Miranda Moore MRN: [id number] Date of Birth: 08/03/1961 No data recorded  Encounter Date: 12/14/2018  OT End of Session - 12/14/18 1544    Visit Number  13    Number of Visits  16    Date for OT Re-Evaluation  01/04/19    Authorization Type  [employer] - Visit Number  13    Authorization - Number of Visits  16    OT Start Time  1500    OT Stop Time  1530    OT Time Calculation (min)  30 min    Activity Tolerance  Patient tolerated treatment well    Behavior During Therapy  [facility] for tasks assessed/performed       Past Medical History:  Diagnosis Date  . Anxiety   . Depression   . GERD (gastroesophageal reflux disease)   . Hypertension   . MRSA infection 2007   left lower leg  . Pre-diabetes   . Sleep apnea    uses CPAP nightly    Past Surgical History:  Procedure Laterality Date  . ABDOMINAL HYSTERECTOMY  2002  . APPENDECTOMY  2013   laparascopic with removal of tubal remnant  . COLONOSCOPY  2015  . KNEE SURGERY  2012  . LAPAROSCOPIC OOPHORECTOMY  2012  . WRIST ARTHROSCOPY WITH DEBRIDEMENT Right 08/30/2018   Procedure: WRIST ARTHROSCOPY WITH DEBRIDEMENT AND SYNOVECTOMY;  Surgeon: Roseanne Kaufman, MD;  Location: [facility];  Service: Orthopedics;  Laterality: Right;    There were no vitals filed for this visit.  Subjective Assessment - 12/14/18 1540    Subjective   I am still good - no pain - could not find 1 lbs weight but did something similiar and done 3 sets and putty 3 sets - did wear wrist wrap at work     Patient Stated Goals  I want to be able to get my wrist ROM and strength back to use it like before without pain     Currently in Pain?  No/denies         [facility] OT Assessment - 12/14/18 0001       Strength   Right Hand Grip (lbs)  70    Right Hand Lateral Pinch  23 lbs    Right Hand 3 Point Pinch  16 lbs    Left Hand Grip (lbs)  80    Left Hand Lateral Pinch  24 lbs    Left Hand 3 Point Pinch  15 lbs       Pt's still pain free - AROM progressing well  No tenderness  No pain with resistance to wrist flexion ,ext, RD, UD    grip improved and 3 point improve        Last week  PRWHE with pt for pain 10/50 because she do not do anything heavy  And function 28/50     Upgrade to 2 lbs for wrist all planes  This date - 3 sets for sup /pro  Pain free  And add to do  1 set of 12 for RD, UD, flexion , extention  Upgrade to green putty for gripping 12 reps - 2 sets  And add pulling and twisting one set of 12 x Increase a set   Pt to increase to 2nd  set for weight and putty in 3 days  And 3rd set in 6 days   Reinforce again to do progression daily - to be ready for next weeks upgrade        OT Education - 12/14/18 1543    Education Details  progress, findings and upgrade HEP     Person(s) Educated  Patient    Methods  Explanation;Demonstration;Handout    Comprehension  Verbalized understanding;Returned demonstration       OT Short Term Goals - 12/07/18 1600      OT SHORT TERM GOAL #1   Title  Pain on PRWHE improve to less than 8/50 with use in ADL's and IADL's     Baseline  pain score on PRWHE 24/50 at eval - and  was 3 wks ago 0/10  - but pain increase again last 2 wks 3-4/10     Time  2    Period  Weeks    Status  On-going    Target Date  12/21/18      OT SHORT TERM GOAL #2   Title  Pt to be ind in HEP to increase AROM in wrist to [facility] without increase symptoms     Status  Achieved        OT Long Term Goals - 12/07/18 1601      OT LONG TERM GOAL #1   Title  R wrist strength increase to 4+/5 to be able to lift , pull , push more than  5 lbs without symptoms     Baseline  Pt over done her HEP 2 wks ago -with 3 lbs and had increase symptoms - will  gradually increase strength this date with 1 lbs and teal med putty - 1 set and increase this next week 3 sets     Time  4    Period  Weeks    Status  On-going    Target Date  01/04/19      OT LONG TERM GOAL #2   Title  Grip and prehension  strength in the R increase to more than 50% compare to L to return to prior level of function and no increase symptoms     Baseline  grip improved greatly 3 wks ago - but had set back with pain at ECU - grip this date 60R and L 80 lbs     Time  4    Period  Weeks    Status  On-going    Target Date  01/04/19      OT LONG TERM GOAL #3   Title  Function score on PRWHE improve wiht more than 25 points     Baseline  at eval PRWHE function score 46/50 , improve to 28/50 - to be assess next visit    Time  4    Period  Weeks    Status  On-going    Target Date  01/04/19      OT LONG TERM GOAL #4   Title  Will add work tasks to program as patient progresses    Baseline  simulate as strength increase     Time  4    Period  Weeks    Status  On-going    Target Date  01/04/19            Plan - 12/14/18 1546    Clinical Impression Statement  Pt is 15 wks s/p R wrist TFCC surgery and debridement - pt had increase pain over ECU about  3 wks ago - but was able to start back strengthening last week - and pt to gradually increase and follow progresion of HEP - able to upgrade to  2 lbs and putty to med putty     Occupational performance deficits (Please refer to evaluation for details):  ADL's;IADL's;Work;Leisure    Body Structure / Function / Physical Skills  ADL;Strength;ROM;UE functional use;Pain    Rehab Potential  Good    Clinical Decision Making  Several treatment options, min-mod task modification necessary    Comorbidities Affecting Occupational Performance:  None    Modification or Assistance to Complete Evaluation   No modification of tasks or assist necessary to complete eval    OT Frequency  1x / week    OT Duration  4 weeks    OT  Treatment/Interventions  Self-care/ADL training;Therapeutic exercise;Moist Heat;Paraffin;Splinting;Patient/family education;Fluidtherapy;Contrast Bath;Manual Therapy;Passive range of motion    Plan  cont to increase strength gradually     OT Home Exercise Plan  see pt instruction    Consulted and Agree with Plan of Care  Patient       Patient will benefit from skilled therapeutic intervention in order to improve the following deficits and impairments:  Body Structure / Function / Physical Skills  Visit Diagnosis: Muscle weakness (generalized)  TFCC (triangular fibrocartilage complex) injury, right, initial encounter  Stiffness of right wrist joint  Pain in right wrist  Other lack of coordination    Problem List Patient Active Problem List   Diagnosis Date Noted  . Aftercare 09/13/2018  . Tear of triangular fibrocartilage 09/13/2018  . Abscess of axilla, right 09/09/2015  . Abortion, spontaneous 05/31/2015  . Edema leg 05/31/2015  . Chest discomfort 05/31/2015  . Cough 05/31/2015  . Diabetes mellitus ([place]) 05/31/2015  . Accumulation of fluid in tissues 05/31/2015  . Big thyroid 05/31/2015  . Cardiac murmur 05/31/2015  . Hot flash, menopausal 05/31/2015  . LBP (low back pain) 05/31/2015  . Acute thoracic back pain 05/31/2015  . Multinodular goiter 05/31/2015  . Adiposity 05/31/2015  . Abnormal blood sugar 05/31/2015  . Cutaneous eruption 05/31/2015  . Avitaminosis D 05/31/2015  . Eruption cyst 05/31/2015  . Anxiety 02/15/2015  . Accessory skin tags 11/29/2014  . Esophagitis, reflux 04/27/2008  . Other general symptoms and signs 10/05/2006  . Pure hypercholesterolemia 09/29/2006  . 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 Acute stress disorder 02/18/2006  . 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 Bone/cartilage disorder 07/29/2005  . Benign essential HTN 07/10/2005    Rosalyn Gess  OTR/L,CLT 12/14/2018, 3:50 PM  [place] [address], [place], [address] Phone: [phone]   Fax:  [phone]  Name: JASDEEP DEJARNETT MRN: [id number] Date of Birth: 21-Oct-1960

## 2018-12-21 ENCOUNTER — Ambulatory Visit: Payer: PRIVATE HEALTH INSURANCE | Admitting: Occupational Therapy

## 2018-12-21 ENCOUNTER — Other Ambulatory Visit: Payer: Self-pay

## 2018-12-21 DIAGNOSIS — M25631 Stiffness of right wrist, not elsewhere classified: Secondary | ICD-10-CM

## 2018-12-21 DIAGNOSIS — M6281 Muscle weakness (generalized): Secondary | ICD-10-CM

## 2018-12-21 DIAGNOSIS — S6981XA Other specified injuries of right wrist, hand and finger(s), initial encounter: Secondary | ICD-10-CM

## 2018-12-21 DIAGNOSIS — M25531 Pain in right wrist: Secondary | ICD-10-CM

## 2018-12-21 NOTE — Patient Instructions (Signed)
Upgrade to 3  lbs for wrist  In sup /pro , RD, UD 12 reps - 2 x day - 3 days 2 sets - and 6 days 3 sets  And cont 2 lbs for wrist flexion , extention 15 reps = 3 sets  2 x day   increase in 3 days to 3 lbs one set - 2 x day ; 6 day 2 sets  Pain free  Upgrade to firm dark blue putty  for gripping 12 reps 20 reps  And  pulling and twisting one set of 12 x Increase a  2 sets 3 days , 3 sets 6 days     Reinforce again to do progression daily - to be ready for next weeks upgrade

## 2018-12-21 NOTE — Therapy (Signed)
[facility] [address] [place], [address] Phone: [phone]   Fax:  [phone]  Occupational Therapy Treatment  Patient Details  Name: Miranda Moore MRN: [id number] Date of Birth: Oct 04, 1960 No data recorded  Encounter Date: 12/21/2018  OT End of Session - 12/21/18 1053    Visit Number  14    Number of Visits  16    Date for OT Re-Evaluation  01/04/19    Authorization Type  [employer] Chief [employer] - Visit Number  14    Authorization - Number of Visits  16    OT Start Time  1001    OT Stop Time  1042    OT Time Calculation (min)  41 min    Activity Tolerance  Patient tolerated treatment well    Behavior During Therapy  [facility] for tasks assessed/performed       Past Medical History:  Diagnosis Date  . Anxiety   . Depression   . GERD (gastroesophageal reflux disease)   . Hypertension   . MRSA infection 2007   left lower leg  . Pre-diabetes   . Sleep apnea    uses CPAP nightly    Past Surgical History:  Procedure Laterality Date  . ABDOMINAL HYSTERECTOMY  2002  . APPENDECTOMY  2013   laparascopic with removal of tubal remnant  . COLONOSCOPY  2015  . KNEE SURGERY  2012  . LAPAROSCOPIC OOPHORECTOMY  2012  . WRIST ARTHROSCOPY WITH DEBRIDEMENT Right 08/30/2018   Procedure: WRIST ARTHROSCOPY WITH DEBRIDEMENT AND SYNOVECTOMY;  Surgeon: Roseanne Kaufman, MD;  Location: [facility];  Service: Orthopedics;  Laterality: Right;    There were no vitals filed for this visit.  Subjective Assessment - 12/21/18 1002    Subjective   Still doing okay -can tell difference now that I do it every day and building up my reps and sets - in the morning is the only time it is little stiff and sore but then no issues during day     Patient Stated Goals  I want to be able to get my wrist ROM and strength back to use it like before without pain     Currently in Pain?  No/denies          [facility] OT Assessment - 12/21/18 0001      Strength   Right Hand Grip (lbs)  76    Right Hand Lateral Pinch  24 lbs    Right Hand 3 Point Pinch  19 lbs    Left Hand Grip (lbs)  80    Left Hand Lateral Pinch  24 lbs    Left Hand 3 Point Pinch  15 lbs       Wrist AROM same as L side now - no pain   strength 4+/5 in all planes except sup /pro  Pain on ulnar side at wrist with resistance to sup - but was compensating with grip and wrist flexion - no pain if wrist neutral  With weight       Upgrade and review  to 3  lbs for wrist  In sup /pro , RD, UD 12 reps - 2 x day - in 3 days 2 sets - and 6 days 3 sets  And cont 2 lbs for wrist flexion , extention 15 reps = 3 sets  2 x day   increase in 3 days to 3 lbs one set - 2 x day ;  6 day 2 sets 3 lbs  Pain free  Upgrade to firm dark blue putty this date   for gripping  20 reps  And  pulling and twisting one set of 12 x Increase a  2 sets 3 days , 3 sets 6 days  But can do one time a day if doing 3 sets     Reinforce again to do progression daily - to be ready for nex           OT Education - 12/21/18 1053    Education Details  progress, findings and upgrade HEP     Person(s) Educated  Patient    Methods  Explanation;Demonstration;Handout    Comprehension  Verbalized understanding;Returned demonstration       OT Short Term Goals - 12/07/18 1600      OT SHORT TERM GOAL #1   Title  Pain on PRWHE improve to less than 8/50 with use in ADL's and IADL's     Baseline  pain score on PRWHE 24/50 at eval - and  was 3 wks ago 0/10  - but pain increase again last 2 wks 3-4/10     Time  2    Period  Weeks    Status  On-going    Target Date  12/21/18      OT SHORT TERM GOAL #2   Title  Pt to be ind in HEP to increase AROM in wrist to [facility] without increase symptoms     Status  Achieved        OT Long Term Goals - 12/07/18 1601      OT LONG TERM GOAL #1   Title  R wrist strength increase to 4+/5 to be able to lift  , pull , push more than  5 lbs without symptoms     Baseline  Pt over done her HEP 2 wks ago -with 3 lbs and had increase symptoms - will gradually increase strength this date with 1 lbs and teal med putty - 1 set and increase this next week 3 sets     Time  4    Period  Weeks    Status  On-going    Target Date  01/04/19      OT LONG TERM GOAL #2   Title  Grip and prehension  strength in the R increase to more than 50% compare to L to return to prior level of function and no increase symptoms     Baseline  grip improved greatly 3 wks ago - but had set back with pain at ECU - grip this date 60R and L 80 lbs     Time  4    Period  Weeks    Status  On-going    Target Date  01/04/19      OT LONG TERM GOAL #3   Title  Function score on PRWHE improve wiht more than 25 points     Baseline  at eval PRWHE function score 46/50 , improve to 28/50 - to be assess next visit    Time  4    Period  Weeks    Status  On-going    Target Date  01/04/19      OT LONG TERM GOAL #4   Title  Will add work tasks to program as patient progresses    Baseline  simulate as strength increase     Time  4    Period  Weeks    Status  On-going  Target Date  01/04/19            Plan - 12/21/18 1054    Clinical Impression Statement  Pt is 16wks s/p R wrist TFCC surgery and debridement - pt had increase pain over ECU about 3-4 wks ago - but was able to start back with strenghethening again - able to initiate this date 3 lbs for wrist and firm putty - pt report only feeling some soreness and stiffness in the am     Occupational Profile and client history currently impacting functional performance  injury since 10/11/2017, currently on light duty     Occupational performance deficits (Please refer to evaluation for details):  ADL's;IADL's;Work;Leisure    Body Structure / Function / Physical Skills  ADL;Strength;ROM;UE functional use;Pain    Rehab Potential  Good    Clinical Decision Making  Several treatment  options, min-mod task modification necessary    Modification or Assistance to Complete Evaluation   No modification of tasks or assist necessary to complete eval    OT Frequency  1x / week    OT Duration  2 weeks    OT Treatment/Interventions  Self-care/ADL training;Therapeutic exercise;Moist Heat;Paraffin;Splinting;Patient/family education;Fluidtherapy;Contrast Bath;Manual Therapy;Passive range of motion    Plan  cont to increase strength gradually     OT Home Exercise Plan  see pt instruction    Consulted and Agree with Plan of Care  Patient       Patient will benefit from skilled therapeutic intervention in order to improve the following deficits and impairments:  Body Structure / Function / Physical Skills  Visit Diagnosis: Muscle weakness (generalized)  TFCC (triangular fibrocartilage complex) injury, right, initial encounter  Stiffness of right wrist joint  Pain in right wrist    Problem List Patient Active Problem List   Diagnosis Date Noted  . Aftercare 09/13/2018  . Tear of triangular fibrocartilage 09/13/2018  . Abscess of axilla, right 09/09/2015  . Abortion, spontaneous 05/31/2015  . Edema leg 05/31/2015  . Chest discomfort 05/31/2015  . Cough 05/31/2015  . Diabetes mellitus ([place]) 05/31/2015  . Accumulation of fluid in tissues 05/31/2015  . Big thyroid 05/31/2015  . Cardiac murmur 05/31/2015  . Hot flash, menopausal 05/31/2015  . LBP (low back pain) 05/31/2015  . Acute thoracic back pain 05/31/2015  . Multinodular goiter 05/31/2015  . Adiposity 05/31/2015  . Abnormal blood sugar 05/31/2015  . Cutaneous eruption 05/31/2015  . Avitaminosis D 05/31/2015  . Eruption cyst 05/31/2015  . Anxiety 02/15/2015  . Accessory skin tags 11/29/2014  . Esophagitis, reflux 04/27/2008  . Other general symptoms and signs 10/05/2006  . Pure hypercholesterolemia 09/29/2006  . 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 Acute stress disorder  02/18/2006  . 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 Bone/cartilage disorder 07/29/2005  . Benign essential HTN 07/10/2005    Rosalyn Gess OTR/L,CLT 12/21/2018, 10:57 AM  [facility] [address] [place], [address] Phone: [phone]   Fax:  [phone]  Name: Miranda Moore MRN: [id number] Date of Birth: 1961/03/09

## 2018-12-28 ENCOUNTER — Other Ambulatory Visit: Payer: Self-pay

## 2018-12-28 ENCOUNTER — Ambulatory Visit: Payer: PRIVATE HEALTH INSURANCE | Admitting: Occupational Therapy

## 2018-12-28 DIAGNOSIS — M6281 Muscle weakness (generalized): Secondary | ICD-10-CM

## 2018-12-28 DIAGNOSIS — M25631 Stiffness of right wrist, not elsewhere classified: Secondary | ICD-10-CM

## 2018-12-28 DIAGNOSIS — S6981XA Other specified injuries of right wrist, hand and finger(s), initial encounter: Secondary | ICD-10-CM

## 2018-12-28 DIAGNOSIS — M25531 Pain in right wrist: Secondary | ICD-10-CM

## 2018-12-28 DIAGNOSIS — R278 Other lack of coordination: Secondary | ICD-10-CM

## 2018-12-28 NOTE — Therapy (Signed)
[facility] [address] [place], [address] Phone: [phone]   Fax:  [phone]  Occupational Therapy Treatment  Patient Details  Name: Miranda Moore MRN: [id number] Date of Birth: May 30, 1961 No data recorded  Encounter Date: 12/28/2018  OT End of Session - 12/28/18 1352    Visit Number  15    Number of Visits  16    Date for OT Re-Evaluation  01/04/19    Authorization Type  [employer] - Visit Number  15    Authorization - Number of Visits  16    OT Start Time  1302    OT Stop Time  1344    OT Time Calculation (min)  42 min    Activity Tolerance  Patient tolerated treatment well    Behavior During Therapy  [facility] for tasks assessed/performed       Past Medical History:  Diagnosis Date  . Anxiety   . Depression   . GERD (gastroesophageal reflux disease)   . Hypertension   . MRSA infection 2007   left lower leg  . Pre-diabetes   . Sleep apnea    uses CPAP nightly    Past Surgical History:  Procedure Laterality Date  . ABDOMINAL HYSTERECTOMY  2002  . APPENDECTOMY  2013   laparascopic with removal of tubal remnant  . COLONOSCOPY  2015  . KNEE SURGERY  2012  . LAPAROSCOPIC OOPHORECTOMY  2012  . WRIST ARTHROSCOPY WITH DEBRIDEMENT Right 08/30/2018   Procedure: WRIST ARTHROSCOPY WITH DEBRIDEMENT AND SYNOVECTOMY;  Surgeon: Roseanne Kaufman, MD;  Location: [facility];  Service: Orthopedics;  Laterality: Right;    There were no vitals filed for this visit.  Subjective Assessment - 12/28/18 1349    Subjective   My wrist still sore and stiff in the am and then gets better - it is sore - but not pain or tenderness- did do the weight 2-3 sets depending if I work - and putty I done  3 sets - I like that     Patient Stated Goals  I want to be able to get my wrist ROM and strength back to use it like before without pain     Currently in Pain?  Yes    Pain Score  2      Pain Location  Wrist    Pain Orientation  Right    Pain Descriptors / Indicators  Sore    Pain Type  Surgical pain    Pain Onset  1 to 4 weeks ago         [facility] OT Assessment - 12/28/18 0001      Strength   Right Hand Grip (lbs)  80    Right Hand Lateral Pinch  25 lbs    Right Hand 3 Point Pinch  20 lbs    Left Hand Grip (lbs)  80    Left Hand Lateral Pinch  4 lbs    Left Hand 3 Point Pinch  17 lbs     assess with pt - cannot push up with R wrist and hand , pain with pushup on wall  And feel pull with weight more than 5 lbs -   great progress in grip and prehension   see flowsheet   wrist still some pain or soreness end range sup , and weaker on RD , and wrist extention - with resistance  Pt to cont with dark blue putty firm for gripping , pulling and twisting  But can alternate with pulling  Putty doing RD, UD and sup with putty  - review with pt this date   3 sets 12  Reps And then still keep at 3 lbs for wrist in all planes - 2-3 sets  But can alternate  Wrist HEP with 5 lbs for elbow and shoulders- every other day - over head shoulder punches, rows for scapula squeezes , and elbow curls 12 reps  - review this date with pt  2 sets  Can increase to 3 sets in 3-4 days    would recommend further conditioning of R UE - because of pt had injury and favoring R UE for more than year       OT Education - 12/28/18 1352    Education Details  progress, findings and upgrade HEP     Person(s) Educated  Patient    Methods  Explanation;Demonstration;Handout    Comprehension  Verbalized understanding;Returned demonstration       OT Short Term Goals - 12/07/18 1600      OT SHORT TERM GOAL #1   Title  Pain on PRWHE improve to less than 8/50 with use in ADL's and IADL's     Baseline  pain score on PRWHE 24/50 at eval - and  was 3 wks ago 0/10  - but pain increase again last 2 wks 3-4/10     Time  2    Period  Weeks    Status  On-going    Target Date   12/21/18      OT SHORT TERM GOAL #2   Title  Pt to be ind in HEP to increase AROM in wrist to [facility] without increase symptoms     Status  Achieved        OT Long Term Goals - 12/07/18 1601      OT LONG TERM GOAL #1   Title  R wrist strength increase to 4+/5 to be able to lift , pull , push more than  5 lbs without symptoms     Baseline  Pt over done her HEP 2 wks ago -with 3 lbs and had increase symptoms - will gradually increase strength this date with 1 lbs and teal med putty - 1 set and increase this next week 3 sets     Time  4    Period  Weeks    Status  On-going    Target Date  01/04/19      OT LONG TERM GOAL #2   Title  Grip and prehension  strength in the R increase to more than 50% compare to L to return to prior level of function and no increase symptoms     Baseline  grip improved greatly 3 wks ago - but had set back with pain at [facility] - grip this date 60R and L 80 lbs     Time  4    Period  Weeks    Status  On-going    Target Date  01/04/19      OT LONG TERM GOAL #3   Title  Function score on PRWHE improve wiht more than 25 points     Baseline  at eval PRWHE function score 46/50 , improve to 28/50 - to be assess next visit    Time  4    Period  Weeks    Status  On-going    Target Date  01/04/19      OT LONG TERM GOAL #4   Title  Will add work tasks to program as patient progresses    Baseline  simulate as strength increase     Time  4    Period  Weeks    Status  On-going    Target Date  01/04/19            Plan - 12/28/18 1353    Clinical Impression Statement  Pt is 17 wks s/p R wrist TFCC surgery and debridement - she had a setback after she seen the surgeon last time - but made great progress since then - doing 3 lbs for wrist and grip /prehension strength made great progress- but still some soreness and weakness in R UE from having injury for about year - stil cannot push up with hand or pushup on wall - this date was able to add 5 lbs for elbow and  shoulder for conditiioning- pt had soreness  but report no pain     Occupational Profile and client history currently impacting functional performance  injury since 10/11/2017, currently on light duty     Occupational performance deficits (Please refer to evaluation for details):  ADL's;IADL's;Work;Leisure    Body Structure / Function / Physical Skills  ADL;Strength;ROM;UE functional use;Pain    Rehab Potential  Good    Clinical Decision Making  Several treatment options, min-mod task modification necessary    Comorbidities Affecting Occupational Performance:  None    Modification or Assistance to Complete Evaluation   No modification of tasks or assist necessary to complete eval    OT Frequency  1x / week    OT Duration  --   1 wk   OT Treatment/Interventions  Self-care/ADL training;Therapeutic exercise;Moist Heat;Paraffin;Splinting;Patient/family education;Fluidtherapy;Contrast Bath;Manual Therapy;Passive range of motion    Plan  cont to increase strength/ weight  gradually  and  conditioning of R UE     OT Home Exercise Plan  see pt instruction    Consulted and Agree with Plan of Care  Patient       Patient will benefit from skilled therapeutic intervention in order to improve the following deficits and impairments:  Body Structure / Function / Physical Skills  Visit Diagnosis: Muscle weakness (generalized)  TFCC (triangular fibrocartilage complex) injury, right, initial encounter  Stiffness of right wrist joint  Pain in right wrist  Other lack of coordination    Problem List Patient Active Problem List   Diagnosis Date Noted  . Aftercare 09/13/2018  . Tear of triangular fibrocartilage 09/13/2018  . Abscess of axilla, right 09/09/2015  . Abortion, spontaneous 05/31/2015  . Edema leg 05/31/2015  . Chest discomfort 05/31/2015  . Cough 05/31/2015  . Diabetes mellitus ([place]) 05/31/2015  . Accumulation of fluid in tissues 05/31/2015  . Big thyroid 05/31/2015  . Cardiac  murmur 05/31/2015  . Hot flash, menopausal 05/31/2015  . LBP (low back pain) 05/31/2015  . Acute thoracic back pain 05/31/2015  . Multinodular goiter 05/31/2015  . Adiposity 05/31/2015  . Abnormal blood sugar 05/31/2015  . Cutaneous eruption 05/31/2015  . Avitaminosis D 05/31/2015  . Eruption cyst 05/31/2015  . Anxiety 02/15/2015  . Accessory skin tags 11/29/2014  . Esophagitis, reflux 04/27/2008  . Other general symptoms and signs 10/05/2006  . Pure hypercholesterolemia 09/29/2006  . 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 Acute stress disorder 02/18/2006  . 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 Bone/cartilage disorder 07/29/2005  . Benign essential HTN 07/10/2005  Rosalyn Gess OTR/L,CLT 12/28/2018, 2:04 PM  [facility] [address], [place], [address] Phone: [phone]   Fax:  [phone]  Name: Miranda Moore MRN: [id number] Date of Birth: 1960-11-18

## 2018-12-28 NOTE — Patient Instructions (Signed)
Pt to cont with dark blue putty firm for gripping , pulling and twisting  But can alternate with pulling  Putty doing RD, UD and sup with putty   3 sets 12  Reps And then still keep at 3 lbs for wrist in all planes - 2-3 sets  But can alternate  Wrist HEP with 5 lbs for elbow and shoulders- every other day - over head shoulder punches, rows for scapula squeezes , and elbow curls 12 reps  2 sets  Can increase to 3 sets in 3-4 days

## 2019-01-04 ENCOUNTER — Other Ambulatory Visit: Payer: Self-pay

## 2019-01-04 ENCOUNTER — Ambulatory Visit: Payer: PRIVATE HEALTH INSURANCE | Admitting: Occupational Therapy

## 2019-01-04 DIAGNOSIS — M25531 Pain in right wrist: Secondary | ICD-10-CM

## 2019-01-04 DIAGNOSIS — M6281 Muscle weakness (generalized): Secondary | ICD-10-CM

## 2019-01-04 DIAGNOSIS — R278 Other lack of coordination: Secondary | ICD-10-CM

## 2019-01-04 DIAGNOSIS — M25631 Stiffness of right wrist, not elsewhere classified: Secondary | ICD-10-CM

## 2019-01-04 DIAGNOSIS — S6981XA Other specified injuries of right wrist, hand and finger(s), initial encounter: Secondary | ICD-10-CM

## 2019-01-04 NOTE — Patient Instructions (Signed)
See NOTE- upgrade to Blue band 7.5 lbs for elbow and shoulders  Cont dark blue putty for grip , pulling and twisting

## 2019-01-04 NOTE — Therapy (Signed)
[facility] 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 Anxiety   . Depression   . GERD (gastroesophageal reflux disease)   . Hypertension   . MRSA infection 2007   left lower leg  . Pre-diabetes   . Sleep apnea    uses CPAP nightly    Past Surgical History:  Procedure Laterality Date  . ABDOMINAL HYSTERECTOMY  2002  . APPENDECTOMY  2013   laparascopic with removal of tubal remnant  . COLONOSCOPY  2015  . KNEE SURGERY  2012  . LAPAROSCOPIC OOPHORECTOMY  2012  . WRIST ARTHROSCOPY WITH DEBRIDEMENT Right 08/30/2018   Procedure: WRIST ARTHROSCOPY WITH DEBRIDEMENT AND SYNOVECTOMY;  Surgeon: Roseanne Kaufman, MD;  Location: [facility];  Service: Orthopedics;  Laterality: Right;    There were no vitals filed for this visit.  Subjective Assessment - 01/04/19 1352    Subjective   I just seen Dr Amedeo Plenty - said to expect to have some soreness in the next few weeks and months - don't know if I sleep weird but am stiff and sore but then improve - I did do the exercises with putty and 5 lbs weight - but only 2 sets     Limitations  10-15 lbs to 15 June - then 30 lbs to 6th July and middle July full duty    Patient Stated Goals  I want  to be able to get my wrist ROM and strength back to use it like before without pain     Currently in Pain?  Yes    Pain Score  3     Pain Location  Wrist    Pain Orientation  Right    Pain Descriptors / Indicators  Sore    Pain Type  Surgical pain    Pain Onset  1 to 4 weeks ago         [facility] OT Assessment - 01/04/19 0001      Strength   Right Hand Grip (lbs)  80    Right Hand Lateral Pinch  25 lbs    Right Hand 3 Point Pinch  20 lbs    Left Hand Grip (lbs)  80    Left Hand Lateral Pinch  25 lbs    Left Hand 3 Point Pinch  17 lbs         Pt to cont with firm dark blue putty - but only gripping , pulling and twisting - can do now daily but more on time then reps/sets - but pain free 10 min to 15 min  Pt able to carry or lift 5 lbs with no increase pull on ulnar wrist  But slight pull with 6-8 lbs   Upgrade her HEP to blue therapy band = 7.5 lbs  Pt to do elbow flexion thumb up and palm up 2 x 15 reps  Scapula squeezes 4 sets of  12-15 reps  [place] extention 2 x 12 reps  Ext rotation 2 x 12 reps  But alternate exercises above  Reinforce to keep wrist neutral - wanted to do UD and extention during some HEP -  Pt still on 10-15 lbs limited until middle June then 20 lbs - 6th July increase to full duty y                OT Education - 01/04/19 1355    Education Details  upgrade to [employer] , body mechanics     Person(s) Educated  Patient    Methods  Explanation;Demonstration;Handout    Comprehension  Verbalized understanding;Returned demonstration       OT Short Term Goals - 01/04/19 1359      OT SHORT TERM GOAL #1   Title  Pain on PRWHE improve to less than 8/50 with use in ADL's and IADL's     Baseline  pain score on PRWHE 24/50 at eval - and  now only soreness but cannot weight bear or feel pull at 7 lbs     Time  4    Period  Weeks    Status  On-going    Target Date  02/01/19      OT SHORT TERM GOAL #2   Title  Pt to be ind in HEP to increase AROM  in wrist to [facility] without increase symptoms     Status  Achieved        OT Long Term Goals - 01/04/19 1359      OT LONG TERM GOAL #1   Title  R wrist strength increase to 4+/5 to be able to lift , pull , push more than  5 lbs without symptoms     Status  Achieved      OT LONG TERM GOAL #2   Title  Grip and prehension  strength in the R increase to more than 50% compare to L to return to prior level of function and no increase symptoms     Baseline  grip and prhenesion same L without symptoms  - but cannot push up , lift more than 6 lbs - feel pull - increase symptoms     Time  4    Period  Weeks    Status  On-going    Target Date  02/01/19      OT LONG TERM GOAL #3   Title  Function score on PRWHE improve wiht more than 25 points     Baseline  at eval PRWHE function score 46/50 , improve to 28/50 - to be assess next visit    Time  5    Period  Weeks    Status  On-going    Target Date  02/08/19      OT LONG TERM GOAL #4   Title  Will add work tasks to program as patient progresses    Baseline  simulate as strength increase and symptoms free up to 10 lbs - at the moment symptoms free at 5     Time  5    Period  Weeks    Status  On-going    Target Date  02/08/19            Plan - 01/04/19 1356    Clinical Impression Statement  Pt is about 18 wks s/p R wrist TFCC surgery and debridement - pt seen surgeon today - cont 10-15 lbs , then 15 JUne - 30 lbs - then 6th July  To full duty  - to cont OT 1 x wk for contditioning and strenghtening - pt with some soreness in am - but then usually improvwe during day - report today stayed - pt upgrade for elbow and shouder to 7.5 lbs with blue band - and cont putty - focus on stabilizing wrist during exercises     Occupational Profile and client history currently impacting functional performance  injury since 10/11/2017, currently on light duty     Occupational performance deficits (Please refer to evaluation for details):   ADL's;IADL's;Work;Leisure    Body Structure / Function / Physical Skills  ADL;Strength;ROM;UE functional use;Pain    Rehab Potential  Good    Clinical Decision Making  Several treatment options, min-mod task modification necessary    Comorbidities Affecting Occupational Performance:  None    Modification or Assistance to Complete Evaluation   No modification of tasks or assist necessary to complete eval    OT Frequency  1x / week    OT Duration  4 weeks    OT Treatment/Interventions  Self-care/ADL training;Therapeutic exercise;Moist Heat;Paraffin;Splinting;Patient/family education;Fluidtherapy;Contrast Bath;Manual Therapy;Passive range of motion    Plan  cont to increase strength/ weight  gradually  and  conditioning of R UE     OT Home Exercise Plan  see pt instruction    Consulted and Agree with Plan of Care  Patient       Patient will benefit from skilled therapeutic intervention in order to improve the following deficits and impairments:  Body Structure / Function / Physical Skills  Visit Diagnosis: Muscle weakness (generalized) - Plan: Ot plan of care cert/re-cert  TFCC (triangular fibrocartilage complex) injury, right, initial encounter - Plan: Ot plan of care cert/re-cert  Stiffness of right wrist joint - Plan: Ot plan of care cert/re-cert  Pain in right wrist - Plan: Ot plan of care cert/re-cert  Other lack of coordination - Plan: Ot plan of care cert/re-cert    Problem List Patient Active Problem List   Diagnosis Date Noted  . Aftercare 09/13/2018  . Tear of triangular fibrocartilage 09/13/2018  . Abscess of axilla, right 09/09/2015  . Abortion, spontaneous 05/31/2015  . Edema leg 05/31/2015  . Chest discomfort 05/31/2015  . Cough 05/31/2015  . Diabetes mellitus ([place]) 05/31/2015  . Accumulation of fluid in tissues 05/31/2015  . Big thyroid 05/31/2015  . Cardiac murmur 05/31/2015  . Hot flash, menopausal 05/31/2015  . LBP (low back pain) 05/31/2015  . Acute  thoracic back pain 05/31/2015  . Multinodular goiter 05/31/2015  . Adiposity 05/31/2015  . Abnormal blood sugar 05/31/2015  . Cutaneous eruption 05/31/2015  . Avitaminosis D 05/31/2015  . Eruption cyst 05/31/2015  . Anxiety 02/15/2015  . Accessory skin tags 11/29/2014  . Esophagitis, reflux 04/27/2008  . Other general symptoms and signs 10/05/2006  . Pure hypercholesterolemia 09/29/2006  . 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 Acute stress disorder 02/18/2006  . 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 Bone/cartilage disorder 07/29/2005  . Benign essential HTN 07/10/2005    Rosalyn Gess OTR/L, CLT 01/04/2019, 2:44 PM  [facility] [address], [place],  [place] Phone: [phone]   Fax:  [phone]  Name: CAMY LEDER MRN: [id number] Date of Birth: [birth year]

## 2019-01-10 ENCOUNTER — Ambulatory Visit: Payer: PRIVATE HEALTH INSURANCE | Admitting: Occupational Therapy

## 2019-01-11 ENCOUNTER — Ambulatory Visit: Payer: PRIVATE HEALTH INSURANCE | Attending: Orthopedic Surgery | Admitting: Occupational Therapy

## 2019-01-11 ENCOUNTER — Other Ambulatory Visit: Payer: Self-pay

## 2019-01-11 DIAGNOSIS — M25531 Pain in right wrist: Secondary | ICD-10-CM | POA: Insufficient documentation

## 2019-01-11 DIAGNOSIS — S6981XA Other specified injuries of right wrist, hand and finger(s), initial encounter: Secondary | ICD-10-CM | POA: Diagnosis present

## 2019-01-11 DIAGNOSIS — M25631 Stiffness of right wrist, not elsewhere classified: Secondary | ICD-10-CM | POA: Diagnosis present

## 2019-01-11 DIAGNOSIS — R278 Other lack of coordination: Secondary | ICD-10-CM | POA: Diagnosis present

## 2019-01-11 DIAGNOSIS — M6281 Muscle weakness (generalized): Secondary | ICD-10-CM | POA: Insufficient documentation

## 2019-01-11 NOTE — Patient Instructions (Signed)
See note

## 2019-01-11 NOTE — Therapy (Signed)
[facility] [address], [place], [address] Phone: [phone]   Fax:  [phone]  Occupational Therapy Treatment  Patient Details  Name: Miranda Moore MRN: [id number] Date of Birth: 08-05-1961 No data recorded  Encounter Date: 01/11/2019  OT End of Session - 01/11/19 1547    Visit Number  17    Number of Visits  20    Date for OT Re-Evaluation  02/08/19    Authorization Type  workers Chief [employer] - Visit Number  17    Authorization - Number of Visits  20    OT Start Time  1500    OT Stop Time  1530    OT Time Calculation (min)  30 min    Activity Tolerance  Patient tolerated treatment well    Behavior During Therapy  [facility] for tasks assessed/performed       Past Medical History:  Diagnosis Date  . Anxiety   . Depression   . GERD (gastroesophageal reflux disease)   . Hypertension   . MRSA infection 2007   left lower leg  . Pre-diabetes   . Sleep apnea    uses CPAP nightly    Past Surgical History:  Procedure Laterality Date  . ABDOMINAL HYSTERECTOMY  2002  . APPENDECTOMY  2013   laparascopic with removal of tubal remnant  . COLONOSCOPY  2015  . KNEE SURGERY  2012  . LAPAROSCOPIC OOPHORECTOMY  2012  . WRIST ARTHROSCOPY WITH DEBRIDEMENT Right 08/30/2018   Procedure: WRIST ARTHROSCOPY WITH DEBRIDEMENT AND SYNOVECTOMY;  Surgeon: Roseanne Kaufman, MD;  Location: [facility];  Service: Orthopedics;  Laterality: Right;    There were no vitals filed for this visit.  Subjective Assessment - 01/11/19 1546    Subjective   My wrist was still sore last week but since this weekend my wrist felt good - and did the band and putty 2 sets - doing okay     Patient Stated Goals  I want to be able to get my wrist ROM and strength back to use it like before without pain     Currently in Pain?  No/denies      No report of soreness coming in  And resistance for wrist in all planes  - no pain               Upgrade this date pt to dark green firm putty - but only gripping , twisting and pulling  - can do now daily but decrease to 1 set of 12 reps x 2 day  - increase to 2nd set in 3 days - 2 x day and then 5-6 days can do 10 min or 3 sets  BUT 1 x day   Pt able to carry or lift 6-7  lbs with no increase pull on ulnar wrist  But felt pull with 8-9 lbs Wall pushup pt felt strain or pull less- but still present - pt to still hold off on that    Upgrade her HEP to black therapy band = 10 lbs  Pt to do elbow flexion thumb up and palm up 1 x 15 reps  Scapula squeezes 2 sets of  12-15 reps  SHoulder extention 1 x 12 reps  Ext rotation 1 x 12 reps  But alternate exercises above  And increase to 2nd set in 3 days  Reinforce to keep wrist neutral - wanted to do UD and extention during  some HEP -  Pt still on 10-15 lbs limited until middle June then 20 lbs - 6th July increase to full duty  middle July         OT Education - 01/11/19 1547    Education Details  upgrade to HEP , body mechanics     Person(s) Educated  Patient    Methods  Explanation;Demonstration;Handout    Comprehension  Verbalized understanding;Returned demonstration       OT Short Term Goals - 01/04/19 1359      OT SHORT TERM GOAL #1   Title  Pain on PRWHE improve to less than 8/50 with use in ADL's and IADL's     Baseline  pain score on PRWHE 24/50 at eval - and  now only soreness but cannot weight bear or feel pull at 7 lbs     Time  4    Period  Weeks    Status  On-going    Target Date  02/01/19      OT SHORT TERM GOAL #2   Title  Pt to be ind in HEP to increase AROM in wrist to [facility] without increase symptoms     Status  Achieved        OT Long Term Goals - 01/04/19 1359      OT LONG TERM GOAL #1   Title  R wrist strength increase to 4+/5 to be able to lift , pull , push more than  5 lbs without symptoms     Status  Achieved      OT LONG TERM GOAL #2   Title  Grip and  prehension  strength in the R increase to more than 50% compare to L to return to prior level of function and no increase symptoms     Baseline  grip and prhenesion same L without symptoms  - but cannot push up , lift more than 6 lbs - feel pull - increase symptoms     Time  4    Period  Weeks    Status  On-going    Target Date  02/01/19      OT LONG TERM GOAL #3   Title  Function score on PRWHE improve wiht more than 25 points     Baseline  at eval PRWHE function score 46/50 , improve to 28/50 - to be assess next visit    Time  5    Period  Weeks    Status  On-going    Target Date  02/08/19      OT LONG TERM GOAL #4   Title  Will add work tasks to program as patient progresses    Baseline  simulate as strength increase and symptoms free up to 10 lbs - at the moment symptoms free at 5     Time  5    Period  Weeks    Status  On-going    Target Date  02/08/19            Plan - 01/11/19 1548    Clinical Impression Statement  Pt is about 19 wks s/p R wrist TFCC surgery and debridement - pt report soreness was better this past week - and done blue band HEP with 7.5 lbs - was able to carry and lift 6-7 lbs with no pull- did feel increase pull with 8-9 lbs - was able to upgrade her to black band that is about 10 lbs - for UE HEP - pt to keep wrist  straight - upgrade putty also     Occupational performance deficits (Please refer to evaluation for details):  ADL's;IADL's;Work;Leisure    Body Structure / Function / Physical Skills  ADL;Strength;ROM;UE functional use;Pain    Rehab Potential  Good    Clinical Decision Making  Several treatment options, min-mod task modification necessary    Comorbidities Affecting Occupational Performance:  None    Modification or Assistance to Complete Evaluation   No modification of tasks or assist necessary to complete eval    OT Frequency  1x / week    OT Duration  --   3 wks   OT Treatment/Interventions  Self-care/ADL training;Therapeutic  exercise;Moist Heat;Paraffin;Splinting;Patient/family education;Fluidtherapy;Contrast Bath;Manual Therapy;Passive range of motion    Plan  cont to increase strength/ weight  gradually  and  conditioning of R UE     OT Home Exercise Plan  see pt instruction    Consulted and Agree with Plan of Care  Patient       Patient will benefit from skilled therapeutic intervention in order to improve the following deficits and impairments:  Body Structure / Function / Physical Skills  Visit Diagnosis: Muscle weakness (generalized)  TFCC (triangular fibrocartilage complex) injury, right, initial encounter  Stiffness of right wrist joint  Pain in right wrist  Other lack of coordination    Problem List Patient Active Problem List   Diagnosis Date Noted  . Aftercare 09/13/2018  . Tear of triangular fibrocartilage 09/13/2018  . Abscess of axilla, right 09/09/2015  . Abortion, spontaneous 05/31/2015  . Edema leg 05/31/2015  . Chest discomfort 05/31/2015  . Cough 05/31/2015  . Diabetes mellitus ([place]) 05/31/2015  . Accumulation of fluid in tissues 05/31/2015  . Big thyroid 05/31/2015  . Cardiac murmur 05/31/2015  . Hot flash, menopausal 05/31/2015  . LBP (low back pain) 05/31/2015  . Acute thoracic back pain 05/31/2015  . Multinodular goiter 05/31/2015  . Adiposity 05/31/2015  . Abnormal blood sugar 05/31/2015  . Cutaneous eruption 05/31/2015  . Avitaminosis D 05/31/2015  . Eruption cyst 05/31/2015  . Anxiety 02/15/2015  . Accessory skin tags 11/29/2014  . Esophagitis, reflux 04/27/2008  . Other general symptoms and signs 10/05/2006  . Pure hypercholesterolemia 09/29/2006  . 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 Acute stress disorder 02/18/2006  . 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 Bone/cartilage disorder 07/29/2005  . Benign essential HTN 07/10/2005    Rosalyn Gess OTR/L,CLT 01/11/2019, 3:52 PM  [facility] [address] [place], [address] Phone: [phone]   Fax:  [phone]  Name: Miranda Moore MRN: [id number] Date of Birth: 1960/12/29

## 2019-01-18 ENCOUNTER — Ambulatory Visit: Payer: PRIVATE HEALTH INSURANCE | Admitting: Occupational Therapy

## 2019-01-23 ENCOUNTER — Ambulatory Visit: Payer: PRIVATE HEALTH INSURANCE | Admitting: Occupational Therapy

## 2019-01-23 ENCOUNTER — Other Ambulatory Visit: Payer: Self-pay

## 2019-01-23 DIAGNOSIS — R278 Other lack of coordination: Secondary | ICD-10-CM

## 2019-01-23 DIAGNOSIS — M25531 Pain in right wrist: Secondary | ICD-10-CM

## 2019-01-23 DIAGNOSIS — M6281 Muscle weakness (generalized): Secondary | ICD-10-CM | POA: Diagnosis not present

## 2019-01-23 DIAGNOSIS — M25631 Stiffness of right wrist, not elsewhere classified: Secondary | ICD-10-CM

## 2019-01-23 DIAGNOSIS — S6981XA Other specified injuries of right wrist, hand and finger(s), initial encounter: Secondary | ICD-10-CM

## 2019-01-23 NOTE — Therapy (Signed)
[facility] 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 Anxiety   . Depression   . GERD (gastroesophageal reflux disease)   . Hypertension   . MRSA infection 2007   left lower leg  . Pre-diabetes   . Sleep apnea    uses CPAP nightly    Past Surgical History:  Procedure Laterality Date  . ABDOMINAL HYSTERECTOMY  2002  . APPENDECTOMY  2013   laparascopic with removal of tubal remnant  . COLONOSCOPY  2015  . KNEE SURGERY  2012  . LAPAROSCOPIC OOPHORECTOMY  2012  . WRIST ARTHROSCOPY WITH DEBRIDEMENT Right 08/30/2018   Procedure: WRIST ARTHROSCOPY WITH DEBRIDEMENT AND SYNOVECTOMY;  Surgeon: Roseanne Kaufman, MD;  Location: [facility];  Service: Orthopedics;  Laterality: Right;    There were no vitals filed for this visit.  Subjective Assessment - 01/23/19 1550    Subjective   Doing okay - done my bands about 2 sets every day - wrist sore - but it is cooler and wet today    Patient Stated Goals  I want to be able to get my wrist ROM and strength back to use it like before without pain     Currently in Pain?  Yes    Pain Score  1     Pain Location  Wrist    Pain Orientation  Right    Pain Descriptors / Indicators  Sore    Pain Type  Surgical pain    Pain Onset  More than a month ago    Pain Frequency  Intermittent         [facility] OT Assessment - 01/23/19 0001      Strength   Right Hand Grip (lbs)  86    Right Hand Lateral Pinch  26 lbs    Right Hand 3 Point Pinch  22 lbs    Left Hand Grip (lbs)  84    Left Hand Lateral Pinch  26 lbs    Left Hand 3 Point Pinch  19 lbs      great progress again this week in grip and prehension since taken last time-  See flow sheet   M/M done to wrist end range - sup /pro pt favoring end range and strain /pull with pronation  Upgrade HEP and add GTB for sup /pro - pain free range - 15 reps  2 sets but every other day - add to existing HEP   Add stabilization to wrist in all planes against doorframe - 15 reps  2 sets pain free   to do  every other day  Above 2 new HEP - can be 3 sets on Sunday     Pt to cont dark green firm putty - but only gripping , twisting and pulling  - 10 min or 3 sets  BUT 1 x day   Pt able to carry or lift 8  lbs with no increase pull on ulnar wrist  But felt pull with 9-10  lbs but slight  Wall pushup pt felt strain or pull less- but still present - pt to still hold off on that    Cont with  black therapy band = 10 lbs  Pt to do elbow flexion thumb up and palm up 1 x 15 reps  Scapula squeezes 2 sets of 12-15 reps  SHoulder extention 1 x 12 reps  Ext rotation 1 x 12 reps  But alternate exercises above  And increase to 2nd set in 3 days  Reinforce to keep wrist neutral - wanted to do UD and extention during some HEP -  Pt still on 10-15 lbs limiteduntil middle June then 20 lbs - 6th July increase to full duty middle July                      OT Education - 01/23/19 1609    Education Details  progress and upgrade to [employer]) Educated  Patient    Methods  Explanation;Demonstration;Handout    Comprehension  Verbalized understanding;Returned demonstration       OT Short Term Goals - 01/04/19 1359       OT SHORT TERM GOAL #1   Title  Pain on PRWHE improve to less than 8/50 with use in ADL's and IADL's     Baseline  pain score on PRWHE 24/50 at eval - and  now only soreness but cannot weight bear or feel pull at 7 lbs     Time  4    Period  Weeks    Status  On-going    Target Date  02/01/19      OT SHORT TERM GOAL #2   Title  Pt to be ind in HEP to increase AROM in wrist to [facility] without increase symptoms     Status  Achieved        OT Long Term Goals - 01/04/19 1359      OT LONG TERM GOAL #1   Title  R wrist strength increase to 4+/5 to be able to lift , pull , push more than  5 lbs without symptoms     Status  Achieved      OT LONG TERM GOAL #2   Title  Grip and prehension  strength in the R increase to more than 50% compare to L to return to prior level of function and no increase symptoms     Baseline  grip and prhenesion same L without symptoms  - but cannot push up , lift more than 6 lbs - feel pull - increase symptoms     Time  4    Period  Weeks    Status  On-going    Target Date  02/01/19      OT LONG TERM GOAL #3   Title  Function score on PRWHE improve wiht more than 25 points     Baseline  at eval PRWHE function score 46/50 , improve to 28/50 - to be assess next visit    Time  5    Period  Weeks  Status  On-going    Target Date  02/08/19      OT LONG TERM GOAL #4   Title  Will add work tasks to program as patient progresses    Baseline  simulate as strength increase and symptoms free up to 10 lbs - at the moment symptoms free at 5     Time  5    Period  Weeks    Status  On-going    Target Date  02/08/19            Plan - 01/23/19 1611    Clinical Impression Statement  Pt is about 20 wks s/p R wrist TFCC surgery and debridement - pt report some soreness with cooler and rainy weather today - but able to tolerate upgrade for stabilization to wrist HEP and GTB for wrist sup /pro - pt to stop prior to strain with pronation - showed great progress again  in grip and prehension strength    Occupational performance deficits (Please refer to evaluation for details):  ADL's;IADL's;Work;Leisure    Body Structure / Function / Physical Skills  ADL;Strength;ROM;UE functional use;Pain    Rehab Potential  Good    Clinical Decision Making  Several treatment options, min-mod task modification necessary    Comorbidities Affecting Occupational Performance:  None    Modification or Assistance to Complete Evaluation   No modification of tasks or assist necessary to complete eval    OT Frequency  1x / week    OT Duration  4 weeks    OT Treatment/Interventions  Self-care/ADL training;Therapeutic exercise;Moist Heat;Paraffin;Splinting;Patient/family education;Fluidtherapy;Contrast Bath;Manual Therapy;Passive range of motion    Plan  cont to increase strength/ weight  gradually  and  conditioning of R UE     OT Home Exercise Plan  see pt instruction    Consulted and Agree with Plan of Care  Patient       Patient will benefit from skilled therapeutic intervention in order to improve the following deficits and impairments:   Body Structure / Function / Physical Skills: ADL, Strength, ROM, UE functional use, Pain       Visit Diagnosis: 1. TFCC (triangular fibrocartilage complex) injury, right, initial encounter   2. Muscle weakness (generalized)   3. Stiffness of right wrist joint   4. Pain in right wrist   5. Other lack of coordination       Problem List Patient Active Problem List   Diagnosis Date Noted  . Aftercare 09/13/2018  . Tear of triangular fibrocartilage 09/13/2018  . Abscess of axilla, right 09/09/2015  . Abortion, spontaneous 05/31/2015  . Edema leg 05/31/2015  . Chest discomfort 05/31/2015  . Cough 05/31/2015  . Diabetes mellitus ([place]) 05/31/2015  . Accumulation of fluid in tissues 05/31/2015  . Big thyroid 05/31/2015  . Cardiac murmur 05/31/2015  . Hot flash, menopausal 05/31/2015  . LBP (low back pain) 05/31/2015  . Acute  thoracic back pain 05/31/2015  . Multinodular goiter 05/31/2015  . Adiposity 05/31/2015  . Abnormal blood sugar 05/31/2015  . Cutaneous eruption 05/31/2015  . Avitaminosis D 05/31/2015  . Eruption cyst 05/31/2015  . Anxiety 02/15/2015  . Accessory skin tags 11/29/2014  . Esophagitis, reflux 04/27/2008  . Other general symptoms and signs 10/05/2006  . Pure hypercholesterolemia 09/29/2006  . 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 Acute stress disorder 02/18/2006  . 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 Bone/cartilage disorder 07/29/2005  . Benign essential HTN 07/10/2005    Rosalyn Gess OTR/L,CLT 01/23/2019, 4:14  PM  [facility] [address], [place], [address] Phone: [phone]   Fax:  [phone]  Name: KATHRIN FOLDEN MRN: [id number] Date of Birth: [birth year]

## 2019-01-23 NOTE — Patient Instructions (Signed)
See note - add some stabilization to wrist in all planes  And GTB for wrist sup /pro  2 sets of 15 pain free - every other day  And cont with same from last week

## 2019-02-01 ENCOUNTER — Other Ambulatory Visit: Payer: Self-pay

## 2019-02-01 ENCOUNTER — Ambulatory Visit: Payer: PRIVATE HEALTH INSURANCE | Admitting: Occupational Therapy

## 2019-02-01 DIAGNOSIS — M6281 Muscle weakness (generalized): Secondary | ICD-10-CM | POA: Diagnosis not present

## 2019-02-01 DIAGNOSIS — M25631 Stiffness of right wrist, not elsewhere classified: Secondary | ICD-10-CM

## 2019-02-01 DIAGNOSIS — S6981XA Other specified injuries of right wrist, hand and finger(s), initial encounter: Secondary | ICD-10-CM

## 2019-02-01 DIAGNOSIS — M25531 Pain in right wrist: Secondary | ICD-10-CM

## 2019-02-01 DIAGNOSIS — R278 Other lack of coordination: Secondary | ICD-10-CM

## 2019-02-01 NOTE — Patient Instructions (Signed)
See note

## 2019-02-01 NOTE — Therapy (Signed)
[facility] [address] [place], [address] Phone: [phone]   Fax:  [phone]  Occupational Therapy Treatment  Patient Details  Name: Miranda Moore MRN: [id number] Date of Birth: 1960/10/01 No data recorded  Encounter Date: 02/01/2019  OT End of Session - 02/01/19 1448    Visit Number  19    Number of Visits  20    Date for OT Re-Evaluation  02/08/19    Authorization Type  [employer] - Visit Number  44    Authorization - Number of Visits  20    OT Start Time  1401    OT Stop Time  1445    OT Time Calculation (min)  44 min    Activity Tolerance  Patient tolerated treatment well    Behavior During Therapy  [facility] for tasks assessed/performed       Past Medical History:  Diagnosis Date  . Anxiety   . Depression   . GERD (gastroesophageal reflux disease)   . Hypertension   . MRSA infection 2007   left lower leg  . Pre-diabetes   . Sleep apnea    uses CPAP nightly    Past Surgical History:  Procedure Laterality Date  . ABDOMINAL HYSTERECTOMY  2002  . APPENDECTOMY  2013   laparascopic with removal of tubal remnant  . COLONOSCOPY  2015  . KNEE SURGERY  2012  . LAPAROSCOPIC OOPHORECTOMY  2012  . WRIST ARTHROSCOPY WITH DEBRIDEMENT Right 08/30/2018   Procedure: WRIST ARTHROSCOPY WITH DEBRIDEMENT AND SYNOVECTOMY;  Surgeon: Roseanne Kaufman, MD;  Location: [facility];  Service: Orthopedics;  Laterality: Right;    There were no vitals filed for this visit.  Subjective Assessment - 02/01/19 1445    Subjective   Doing okay - I do not know how I sleep but my wrist is sore in the am - only thing that bother my wrist is when I pick up like 6 pack drinks or groceries off counter - carrying do not bother me but pick up or lifting down    Patient Stated Goals  I want to be able to get my wrist ROM and strength back to use it like before without pain     Currently in  Pain?  Yes    Pain Score  1     Pain Location  Wrist    Pain Orientation  Right    Pain Descriptors / Indicators  Aching    Pain Type  Surgical pain    Pain Frequency  Intermittent         [facility] OT Assessment - 02/01/19 0001      Strength   Right Hand Grip (lbs)  84    Right Hand Lateral Pinch  25 lbs    Right Hand 3 Point Pinch  20 lbs    Left Hand Grip (lbs)  89    Left Hand Lateral Pinch  26 lbs    Left Hand 3 Point Pinch  18 lbs          M/M done to wrist end range - sup /pro show increase strength with grip - and no pain reported - pt to cont with HEP but upgrade to blue band for sup /pro - pain free range - 15 reps  2 sets but every other day    Add stabilization to wrist in all planes but upgrade to using blue band = 7.5 lbs  2 sets of 12 -  pain free   to do every other day   3 sets on Sunday     Pt to cont dark green firm putty but add another 1/2 of putty to it for more bulk - but  gripping ,twisting and pulling- 10 min or 3 sets BUT 1 x day   Pt able to carry or lift8-9lbs with no or little pull on ulnar wrist  Butfelt pull with 10 lbs   Cont with blacktherapy band = 10lbs  Pt to do elbow flexion thumb up and palm up1x 15 reps  Scapula squeezes2sets of 12-15 reps  SHoulder extention1x 12 reps  Ext rotation1x 12 reps  Add this date tricept pull down thumb up and palm down - 2 x 12 reps   Reinforce to keep wrist neutral - wanted to do UD and extention during some HEP -  Pt can do 10 lbs with weight in clinic but 15 lbs had some pull 4/10 with tricept bar on work out machine  Was able to do 15 lbs for scapula pull back and chest press                    OT Education - 02/01/19 1448    Education Details  progress and upgrade to HEP    Person(s) Educated  Patient    Methods  Explanation;Demonstration;Handout    Comprehension  Verbalized understanding;Returned demonstration       OT Short Term Goals -  01/04/19 1359      OT SHORT TERM GOAL #1   Title  Pain on PRWHE improve to less than 8/50 with use in ADL's and IADL's     Baseline  pain score on PRWHE 24/50 at eval - and  now only soreness but cannot weight bear or feel pull at 7 lbs     Time  4    Period  Weeks    Status  On-going    Target Date  02/01/19      OT SHORT TERM GOAL #2   Title  Pt to be ind in HEP to increase AROM in wrist to [facility] without increase symptoms     Status  Achieved        OT Long Term Goals - 01/04/19 1359      OT LONG TERM GOAL #1   Title  R wrist strength increase to 4+/5 to be able to lift , pull , push more than  5 lbs without symptoms     Status  Achieved      OT LONG TERM GOAL #2   Title  Grip and prehension  strength in the R increase to more than 50% compare to L to return to prior level of function and no increase symptoms     Baseline  grip and prhenesion same L without symptoms  - but cannot push up , lift more than 6 lbs - feel pull - increase symptoms     Time  4    Period  Weeks    Status  On-going    Target Date  02/01/19      OT LONG TERM GOAL #3   Title  Function score on PRWHE improve wiht more than 25 points     Baseline  at eval PRWHE function score 46/50 , improve to 28/50 - to be assess next visit    Time  5    Period  Weeks    Status  On-going  Target Date  02/08/19      OT LONG TERM GOAL #4   Title  Will add work tasks to program as patient progresses    Baseline  simulate as strength increase and symptoms free up to 10 lbs - at the moment symptoms free at 5     Time  5    Period  Weeks    Status  On-going    Target Date  02/08/19            Plan - 02/01/19 1449    Clinical Impression Statement  Pt is about 21 wks s/p R wrist TFCC surgery and debridement - pt report soreness in the am at wrist and when picking up still something heavy like groceries off the counter - carrying is okay - pt able to do theraban to about 10 lbs - and weight machine if wrist is  straight plane and pull - she can do 15 lbs - but if lateral pull than 10 lbs- modify her HEP again with 8-10 lbs for bands for wrist and UE conditioning    Occupational performance deficits (Please refer to evaluation for details):  ADL's;IADL's;Work;Leisure    Body Structure / Function / Physical Skills  ADL;Strength;ROM;UE functional use;Pain    Rehab Potential  Good    Clinical Decision Making  Several treatment options, min-mod task modification necessary    Comorbidities Affecting Occupational Performance:  None    Modification or Assistance to Complete Evaluation   No modification of tasks or assist necessary to complete eval    OT Frequency  1x / week    OT Duration  4 weeks    OT Treatment/Interventions  Self-care/ADL training;Therapeutic exercise;Moist Heat;Paraffin;Splinting;Patient/family education;Fluidtherapy;Contrast Bath;Manual Therapy;Passive range of motion    Plan  cont to increase strength/ weight  gradually  and  conditioning of R UE - plan at attempt 10-15 lbs strengthening UE next week    OT Home Exercise Plan  see pt instruction    Consulted and Agree with Plan of Care  Patient       Patient will benefit from skilled therapeutic intervention in order to improve the following deficits and impairments:   Body Structure / Function / Physical Skills: ADL, Strength, ROM, UE functional use, Pain       Visit Diagnosis: 1. TFCC (triangular fibrocartilage complex) injury, right, initial encounter   2. Muscle weakness (generalized)   3. Stiffness of right wrist joint   4. Pain in right wrist   5. Other lack of coordination       Problem List Patient Active Problem List   Diagnosis Date Noted  . Aftercare 09/13/2018  . Tear of triangular fibrocartilage 09/13/2018  . Abscess of axilla, right 09/09/2015  . Abortion, spontaneous 05/31/2015  . Edema leg 05/31/2015  . Chest discomfort 05/31/2015  . Cough 05/31/2015  . Diabetes mellitus ([place]) 05/31/2015  .  Accumulation of fluid in tissues 05/31/2015  . Big thyroid 05/31/2015  . Cardiac murmur 05/31/2015  . Hot flash, menopausal 05/31/2015  . LBP (low back pain) 05/31/2015  . Acute thoracic back pain 05/31/2015  . Multinodular goiter 05/31/2015  . Adiposity 05/31/2015  . Abnormal blood sugar 05/31/2015  . Cutaneous eruption 05/31/2015  . Avitaminosis D 05/31/2015  . Eruption cyst 05/31/2015  . Anxiety 02/15/2015  . Accessory skin tags 11/29/2014  . Esophagitis, reflux 04/27/2008  . Other general symptoms and signs 10/05/2006  . Pure hypercholesterolemia 09/29/2006  . 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  Acute stress disorder 02/18/2006  . 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 Bone/cartilage disorder 07/29/2005  . Benign essential HTN 07/10/2005    Rosalyn Gess OTR/L,CLT 02/01/2019, 2:53 PM  [facility] [address], [place], [address] Phone: [phone]   Fax:  [phone]  Name: Miranda Moore MRN: [id number] Date of Birth: [birth year]

## 2019-02-08 ENCOUNTER — Other Ambulatory Visit: Payer: Self-pay

## 2019-02-08 ENCOUNTER — Ambulatory Visit: Payer: PRIVATE HEALTH INSURANCE | Attending: Orthopedic Surgery | Admitting: Occupational Therapy

## 2019-02-08 DIAGNOSIS — M25531 Pain in right wrist: Secondary | ICD-10-CM | POA: Diagnosis present

## 2019-02-08 DIAGNOSIS — M25631 Stiffness of right wrist, not elsewhere classified: Secondary | ICD-10-CM | POA: Diagnosis present

## 2019-02-08 DIAGNOSIS — S6981XA Other specified injuries of right wrist, hand and finger(s), initial encounter: Secondary | ICD-10-CM | POA: Insufficient documentation

## 2019-02-08 DIAGNOSIS — M6281 Muscle weakness (generalized): Secondary | ICD-10-CM | POA: Insufficient documentation

## 2019-02-08 DIAGNOSIS — R278 Other lack of coordination: Secondary | ICD-10-CM | POA: Insufficient documentation

## 2019-02-08 NOTE — Therapy (Signed)
[facility] [address], [place], [address] Phone: [phone]   Fax:  [phone]  Occupational Therapy Treatment  Patient Details  Name: Miranda Moore MRN: [id number] Date of Birth: 10-20-1960 No data recorded  Encounter Date: 02/08/2019    Past Medical History:  Diagnosis Date  . Anxiety   . Depression   . GERD (gastroesophageal reflux disease)   . Hypertension   . MRSA infection 2007   left lower leg  . Pre-diabetes   . Sleep apnea    uses CPAP nightly    Past Surgical History:  Procedure Laterality Date  . ABDOMINAL HYSTERECTOMY  2002  . APPENDECTOMY  2013   laparascopic with removal of tubal remnant  . COLONOSCOPY  2015  . KNEE SURGERY  2012  . LAPAROSCOPIC OOPHORECTOMY  2012  . WRIST ARTHROSCOPY WITH DEBRIDEMENT Right 08/30/2018   Procedure: WRIST ARTHROSCOPY WITH DEBRIDEMENT AND SYNOVECTOMY;  Surgeon: Roseanne Kaufman, MD;  Location: [facility];  Service: Orthopedics;  Laterality: Right;    There were no vitals filed for this visit.      [facility] OT Assessment - 02/08/19 0001      Strength   Right Hand Grip (lbs)  84    Right Hand Lateral Pinch  26 lbs    Left Hand Grip (lbs)  86    Left Hand Lateral Pinch  26 lbs          M/M done to wrist end range - sup /pro show increase strength with grip - and no pain reported - pt to cont with HEP but upgrade to black band for sup /pro - pain free range - 15 reps but one set every other day     Add stabilization to wrist in all planes but upgrade to using black =9  lbs   1sets of 12 -  pain free  to do every other day   2 and 3 sets in the next 2 wks increase     Pt to cont dark green firm putty but add another 1/2 of putty to it for more bulk - but  gripping ,twisting and pulling-10 min or 3 sets BUT 1 x day   Pt able to carry or lift 10 lbs this date with little pull - less than 2/10    Upgrade to silver  therapy band = 15 lbs  Pt to do elbow flexion thumb up and palm up1x 12 reps  Scapula squeezes1sets of 12-15 reps  SHoulder extention1x 12 reps   tricept pull down thumb up and palm down - 1 x 12 reps   Reinforce to keep wrist neutral - wanted to do UD and extention during some HEP -  Pt can do 15  lbs with weight in clinic on  tricept bar on work out machine  Was able to do 20  lbs for scapula pull back and chest press   Pt to cont with HEP until appt with surgeon in 2 wks - possible discharge from OT                 OT Short Term Goals - 02/08/19 1801      OT SHORT TERM GOAL #1   Title  Pain on PRWHE improve to less than 8/50 with use in ADL's and IADL's     Baseline  soreness but improve as the day goes - no more than 2-3/10    Status  Achieved  OT SHORT TERM GOAL #2   Title  Pt to be ind in HEP to increase AROM in wrist to [facility] without increase symptoms     Status  Achieved        OT Long Term Goals - 02/08/19 1802      OT LONG TERM GOAL #1   Title  R wrist strength increase to 4+/5 to be able to lift , pull , push more than  5 lbs without symptoms     Status  Achieved      OT LONG TERM GOAL #2   Title  Grip and prehension  strength in the R increase to more than 50% compare to L to return to prior level of function and no increase symptoms     Status  Achieved      OT LONG TERM GOAL #3   Title  Function score on PRWHE improve wiht more than 25 points     Baseline  use hand in all act except push up and carry or lift heavy objects    Status  Achieved      OT LONG TERM GOAL #4   Title  Will add work tasks to program as patient progresses    Status  Achieved              Patient will benefit from skilled therapeutic intervention in order to improve the following deficits and impairments:           Visit Diagnosis: No diagnosis found.    Problem List Patient Active Problem List   Diagnosis Date Noted  . Aftercare  09/13/2018  . Tear of triangular fibrocartilage 09/13/2018  . Abscess of axilla, right 09/09/2015  . Abortion, spontaneous 05/31/2015  . Edema leg 05/31/2015  . Chest discomfort 05/31/2015  . Cough 05/31/2015  . Diabetes mellitus ([place]) 05/31/2015  . Accumulation of fluid in tissues 05/31/2015  . Big thyroid 05/31/2015  . Cardiac murmur 05/31/2015  . Hot flash, menopausal 05/31/2015  . LBP (low back pain) 05/31/2015  . Acute thoracic back pain 05/31/2015  . Multinodular goiter 05/31/2015  . Adiposity 05/31/2015  . Abnormal blood sugar 05/31/2015  . Cutaneous eruption 05/31/2015  . Avitaminosis D 05/31/2015  . Eruption cyst 05/31/2015  . Anxiety 02/15/2015  . Accessory skin tags 11/29/2014  . Esophagitis, reflux 04/27/2008  . Other general symptoms and signs 10/05/2006  . Pure hypercholesterolemia 09/29/2006  . 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 Acute stress disorder 02/18/2006  . 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 Bone/cartilage disorder 07/29/2005  . Benign essential HTN 07/10/2005    Rosalyn Gess OTR/L,CLT 02/08/2019, 6:04 PM  [facility] [address], [place], [address] Phone: [phone]   Fax:  [phone]  Name: Miranda Moore MRN: [id number] Date of Birth: 04/25/1961

## 2019-03-03 ENCOUNTER — Other Ambulatory Visit: Payer: Self-pay | Admitting: Family Medicine

## 2019-03-03 DIAGNOSIS — F439 Reaction to severe stress, unspecified: Secondary | ICD-10-CM
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
## 2019-05-23 ENCOUNTER — Other Ambulatory Visit: Payer: Self-pay | Admitting: Family Medicine

## 2019-05-23 ENCOUNTER — Telehealth: Payer: Self-pay

## 2019-05-23 MED ORDER — NAPROXEN 500 MG PO TBEC: 500.0000 mg | DELAYED_RELEASE_TABLET | Freq: Two times a day (BID) | ORAL | 0 refills | Status: DC

## 2019-05-23 NOTE — Telephone Encounter (Signed)
Patient reports that she has a gout flare up and she is requesting gout medication to be send in for her.  Pharmacy:[facility]  Thanks.

## 2019-05-23 NOTE — Telephone Encounter (Signed)
Recommend EC-Naproxen 500 mg BID #30 and should schedule recheck in the next few days if not improving. Sometimes infection in joints are confused with gout.

## 2019-05-23 NOTE — Telephone Encounter (Signed)
lmtcb

## 2019-05-25 NOTE — Telephone Encounter (Signed)
Called and spoke with patient. She informed me that she jsut picked up her prescription at the pharmacy for her Gout Flare up and it is doing better.

## 2019-06-10 ENCOUNTER — Other Ambulatory Visit: Payer: Self-pay | Admitting: Family Medicine

## 2019-06-10 DIAGNOSIS — F439 Reaction to severe stress, unspecified: Secondary | ICD-10-CM

## 2019-06-14 ENCOUNTER — Telehealth: Payer: Self-pay

## 2019-06-14 NOTE — Telephone Encounter (Signed)
Patient had called office stating that she received message that [facility] Huh would be unable to fill her prescription for Alprazolam until she returned for follow up visit. Patient states that she just started a new job this week and would be unable to take time off, I gave patient the option of virtual visit but she said that would not be a option while she is at work. I offered patient a telephone visit to see if that would work better for her before she starts her shift and she agreed. Telephone visit has been scheduled for 06/15/19 at [facility] . Patient can be reached at [phone] for any further questions. KW

## 2019-06-15 ENCOUNTER — Ambulatory Visit (INDEPENDENT_AMBULATORY_CARE_PROVIDER_SITE_OTHER): Payer: BC Managed Care – PPO | Admitting: Family Medicine

## 2019-06-15 ENCOUNTER — Encounter: Payer: BC Managed Care – PPO | Admitting: Family Medicine

## 2019-06-15 ENCOUNTER — Encounter: Payer: Self-pay | Admitting: Family Medicine

## 2019-06-15 ENCOUNTER — Other Ambulatory Visit: Payer: Self-pay

## 2019-06-15 ENCOUNTER — Other Ambulatory Visit: Payer: Self-pay | Admitting: Family Medicine

## 2019-06-15 DIAGNOSIS — E119 Type 2 diabetes mellitus without complications: Secondary | ICD-10-CM

## 2019-06-15 DIAGNOSIS — F418 Other specified anxiety disorders: Secondary | ICD-10-CM | POA: Diagnosis not present

## 2019-06-15 MED ORDER — SERTRALINE HCL 100 MG PO TABS: 100.0000 mg | ORAL_TABLET | Freq: Every day | ORAL | 1 refills | Status: DC

## 2019-06-15 MED ORDER — ALPRAZOLAM 0.5 MG PO TABS: 0.5000 mg | ORAL_TABLET | Freq: Two times a day (BID) | ORAL | 0 refills | Status: DC | PRN

## 2019-06-15 NOTE — Progress Notes (Signed)
Miranda Moore  MRN: [id number] DOB: [birth year]  Subjective:  HPI   The patient is a 58 year old female who presents via phone visit for anxiety.  She states that her anxiety is up due to increase in stress.  She reports that her brother-in-law was recently killed in a MVA and she is trying to help her sister with things.  She is also maintaining her household and has recently started a new job.  Patient Active Problem List   Diagnosis Date Noted  . Aftercare 09/13/2018  . Tear of triangular fibrocartilage 09/13/2018  . Abscess of axilla, right 09/09/2015  . Abortion, spontaneous 05/31/2015  . Edema leg 05/31/2015  . Chest discomfort 05/31/2015  . Cough 05/31/2015  . Diabetes mellitus ([place]) 05/31/2015  . Accumulation of fluid in tissues 05/31/2015  . Big thyroid 05/31/2015  . Cardiac murmur 05/31/2015  . Hot flash, menopausal 05/31/2015  . LBP (low back pain) 05/31/2015  . Acute thoracic back pain 05/31/2015  . Multinodular goiter 05/31/2015  . Adiposity 05/31/2015  . Abnormal blood sugar 05/31/2015  . Cutaneous eruption 05/31/2015  . Avitaminosis D 05/31/2015  . Eruption cyst 05/31/2015  . Anxiety 02/15/2015  . Accessory skin tags 11/29/2014  . Esophagitis, reflux 04/27/2008  . Other general symptoms and signs 10/05/2006  . Pure hypercholesterolemia 09/29/2006  . 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 Acute stress disorder 02/18/2006  . 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 Bone/cartilage disorder 07/29/2005  . Benign essential HTN 07/10/2005   Past Medical History:  Diagnosis Date  . Anxiety   . Depression   . GERD (gastroesophageal reflux disease)   . Hypertension   . MRSA infection 2007   left lower leg  . Pre-diabetes   . Sleep apnea    uses CPAP nightly   Past Surgical History:  Procedure Laterality Date  . ABDOMINAL HYSTERECTOMY  2002  . APPENDECTOMY  2013   laparascopic with removal of tubal remnant  . COLONOSCOPY   2015  . KNEE SURGERY  2012  . LAPAROSCOPIC OOPHORECTOMY  2012  . WRIST ARTHROSCOPY WITH DEBRIDEMENT Right 08/30/2018   Procedure: WRIST ARTHROSCOPY WITH DEBRIDEMENT AND SYNOVECTOMY;  Surgeon: Roseanne Kaufman, MD;  Location: [facility];  Service: Orthopedics;  Laterality: Right;   Family History  Problem Relation Age of Onset  . Cancer Mother        colon  . Heart failure Mother   . Heart disease Father   . Hypertension Father   . Hypertension Brother   . Heart failure Paternal Grandmother   . Dementia Paternal Grandfather   . Breast cancer Paternal Aunt    Social History   Socioeconomic History  . Marital status: Married    Spouse name: Not on file  . Number of children: Not on file  . Years of education: Not on file  . Highest education level: Not on file  Occupational History  . Not on file  Social Needs  . Financial resource strain: Not on file  . Food insecurity    Worry: Not on file    Inability: Not on file  . Transportation needs    Medical: Not on file    Non-medical: Not on file  Tobacco Use  . Smoking status: Never Smoker  . Smokeless tobacco: Never Used  Substance and Sexual Activity  . Alcohol use: Yes    Alcohol/week: 0.0 standard drinks    Comment: 2-4/week  . Drug use: No  . Sexual  activity: Not on file  Lifestyle  . Physical activity    Days per week: Not on file    Minutes per session: Not on file  . Stress: Not on file  Relationships  . Social Herbalist on phone: Not on file    Gets together: Not on file    Attends religious service: Not on file    Active member of club or organization: Not on file    Attends meetings of clubs or organizations: Not on file    Relationship status: Not on file  . Intimate partner violence    Fear of current or ex partner: Not on file    Emotionally abused: Not on file    Physically abused: Not on file    Forced sexual activity: Not on file  Other Topics Concern  . Not on file   Social History Narrative  . Not on file   Outpatient Encounter Medications as of 06/15/2019  Medication Sig Note  . ALPRAZolam (XANAX) 0.5 MG tablet TAKE 1-2 TABLETS BY MOUTH 3 TIMES DAILY AS NEEDED FOR ANXIETY STRESS   . amLODIPine-Valsartan-HCTZ 10-160-12.5 MG TABS TAKE 1 TABLET DAILY FOR    HYPERTENSION   . aspirin 81 MG tablet Take 81 mg by mouth daily.   . Aspirin Buf,CaCarb-MgCarb-MgO, 81 MG TABS aspirin 81 mg tablet   81 mg by oral route.   . Calcium Carbonate-Vitamin D 600-200 MG-UNIT TABS  09/02/2015: Received from: [facility]  . Cholecalciferol (VITAMIN D-3) 1000 UNITS CAPS Take by mouth daily.   . furosemide (LASIX) 20 MG tablet TAKE 1 TABLET DAILY AS     NEEDED FOR EDEMA   . naproxen (EC NAPROSYN) 500 MG EC tablet Take 1 tablet (500 mg total) by mouth 2 (two) times daily with a meal.   . Omega-3 Fatty Acids (FISH OIL DOUBLE STRENGTH) 1200 MG CAPS Take by mouth. 09/02/2015: Received from: [facility]  . omeprazole (PRILOSEC) 40 MG capsule TAKE 1 CAPSULE DAILY FOR   REFLUX ESOPHAGITIS WITH    GASTRITIS   . POTASSIUM CITRATE PO Take by mouth daily.   . sertraline (ZOLOFT) 50 MG tablet TAKE 1 TABLET DAILY    No facility-administered encounter medications on file as of 06/15/2019.    Allergies  Allergen Reactions  . Chlorpheniramine-Phenylephrine Hives  . Other Hives    actifed  . Sulfa Antibiotics   . Sulfur Other (See Comments)    Low b/p   Review of Systems  Constitutional: Negative.   HENT: Negative.   Eyes: Negative.   Respiratory: Negative.   Cardiovascular: Negative.   Genitourinary: Negative.   Psychiatric/Behavioral: Positive for depression.      Objective:  There were no vitals taken for this visit.  Physical Exam: No apparent respiratory distress during telephonic interview. Mood depressed without apparent crying spells. No suicidal ideation expressed.  Assessment and Plan :  1. Depression with anxiety Over the past  few months has been having extra stress leading to anxiousness with depression and an angry mood. 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 Staying with her sister in [place] and worried about her husband at home. Taking Xanax once a day with occasional BID dose and increased Zoloft to two 50 mg tablets daily. Will refill medications and schedule for labs. If bereavement not controlled with this regimen, will need to schedule counseling with psychologist. Consider EAP program at work for additional help. -  CBC with Differential; Future - Comprehensive Metabolic Panel (CMET); Future - TSH; Future - ALPRAZolam (XANAX) 0.5 MG tablet; Take 1 tablet (0.5 mg total) by mouth 2 (two) times daily as needed for anxiety.  Dispense: 90 tablet; Refill: 0 - sertraline (ZOLOFT) 100 MG tablet; Take 1 tablet (100 mg total) by mouth daily.  Dispense: 90 tablet; Refill: 1  2. Type 2 diabetes mellitus without complication, without long-term current use of insulin ([place]) Feeling well and trying to follow diet. With stress, not as much exercise and only eating 2 meals a day. Continues to use CPAP for sleep apnea and this helps maintain a better sleep pattern. Scheduled for future lab tests to follow up DM. - CBC with Differential; Future - Comprehensive Metabolic Panel (CMET); Future - HgB A1c; Future - Lipid Profile; Future

## 2019-06-15 NOTE — Progress Notes (Signed)
error 

## 2019-09-10 ENCOUNTER — Other Ambulatory Visit: Payer: Self-pay | Admitting: Family Medicine

## 2019-09-10 DIAGNOSIS — F418 Other specified anxiety disorders: Secondary | ICD-10-CM

## 2019-09-10 DIAGNOSIS — R600 Localized edema: Secondary | ICD-10-CM

## 2019-09-10 NOTE — Telephone Encounter (Signed)
Patient has not had recent labs so medication is failing the protocol. / Will send to provider for approval.

## 2019-09-10 NOTE — Telephone Encounter (Signed)
Requested medication (s) are due for refill today: yes  Requested medication (s) are on the active medication list: yes  Last refill:  06/15/2019  Future visit scheduled: yes  Notes to clinic: this refill cannot be delegated   Requested Prescriptions  Pending Prescriptions Disp Refills   ALPRAZolam (XANAX) 0.5 MG tablet [Pharmacy Med Name: ALPRAZOLAM 0.5 MG TABLET] 60 tablet 1    Sig: Take 1 tablet (0.5 mg total) by mouth 2 (two) times daily as needed for anxiety.      Not Delegated - Psychiatry:  Anxiolytics/Hypnotics Failed - 09/10/2019 12:28 PM      Failed - This refill cannot be delegated      Failed - Urine Drug Screen completed in last 360 days.      Failed - Valid encounter within last 6 months    Recent Outpatient Visits           2 months ago Depression with anxiety   [place], Vickki Muff, [place]   2 months ago    [facility], [place], [place]   10 months ago [place], [place], [place]   11 months ago [place], Wendee Beavers, [place]   1 year ago [place], [place]

## 2019-09-11 NOTE — Telephone Encounter (Signed)
Patient has not had labs since 10/2017

## 2019-09-12 NOTE — Telephone Encounter (Signed)
Called patient no answer and left a detail message on voicemail per DPR. Patient was advised to call office back as well.

## 2019-09-12 NOTE — Telephone Encounter (Signed)
Refilled 10 tablets. Needs labwork before refilling more.

## 2019-10-18 ENCOUNTER — Other Ambulatory Visit: Payer: Self-pay

## 2019-10-18 ENCOUNTER — Encounter: Payer: Self-pay | Admitting: Physician Assistant

## 2019-10-18 ENCOUNTER — Ambulatory Visit (INDEPENDENT_AMBULATORY_CARE_PROVIDER_SITE_OTHER): Payer: BC Managed Care – PPO | Admitting: Physician Assistant

## 2019-10-18 VITALS — BP 122/68 | HR 76 | Temp 97.3°F | Ht 69.0 in | Wt 262.4 lb

## 2019-10-18 DIAGNOSIS — F418 Other specified anxiety disorders: Secondary | ICD-10-CM

## 2019-10-18 DIAGNOSIS — R600 Localized edema: Secondary | ICD-10-CM

## 2019-10-18 DIAGNOSIS — Z0000 Encounter for general adult medical examination without abnormal findings: Secondary | ICD-10-CM

## 2019-10-18 DIAGNOSIS — E119 Type 2 diabetes mellitus without complications: Secondary | ICD-10-CM

## 2019-10-18 DIAGNOSIS — Z1211 Encounter for screening for malignant neoplasm of colon: Secondary | ICD-10-CM | POA: Diagnosis not present

## 2019-10-18 DIAGNOSIS — Z1231 Encounter for screening mammogram for malignant neoplasm of breast: Secondary | ICD-10-CM | POA: Diagnosis not present

## 2019-10-18 DIAGNOSIS — I10 Essential (primary) hypertension: Secondary | ICD-10-CM | POA: Diagnosis not present

## 2019-10-18 MED ORDER — FUROSEMIDE 20 MG PO TABS: ORAL_TABLET | ORAL | 1 refills | Status: DC

## 2019-10-18 MED ORDER — AMLODIPINE-VALSARTAN-HCTZ 10-160-12.5 MG PO TABS: 1.0000 | ORAL_TABLET | Freq: Every day | ORAL | 1 refills | Status: DC

## 2019-10-18 MED ORDER — SERTRALINE HCL 100 MG PO TABS: 100.0000 mg | ORAL_TABLET | Freq: Every day | ORAL | 1 refills | Status: DC

## 2019-10-18 NOTE — Patient Instructions (Signed)
Call your insurance about shingles vaccine - Shingrix

## 2019-10-18 NOTE — Progress Notes (Signed)
Patient: Miranda Moore, Female    DOB: 1961/02/05, 59 y.o.   MRN: [id number] Visit Date: 10/18/2019  Today's Provider: Trinna Post, PA-C   Chief Complaint  Patient presents with  . Annual Exam   Subjective:    Annual physical exam Miranda Moore is a 59 y.o. female who presents today for health maintenance and complete physical. She feels well. She reports exercising walking at work. She reports she is sleeping well.  DM II  Lab Results  Component Value Date   HGBA1C 6.3 (H) 10/18/2019   HLD  Lipid Panel     Component Value Date/Time   CHOL 188 10/18/2019 1543   CHOL 173 06/28/2014 0725   TRIG 146 10/18/2019 1543   TRIG 111 06/28/2014 0725   HDL 35 (L) 10/18/2019 1543   HDL 25 (L) 06/28/2014 0725   CHOLHDL 5.4 (H) 10/18/2019 1543   CHOLHDL 5.5 09/04/2015 0651   VLDL 27 09/04/2015 0651   VLDL 22 06/28/2014 0725   LDLCALC 127 (H) 10/18/2019 1543   LDLCALC 126 (H) 06/28/2014 0725   LABVLDL 26 10/18/2019 1543   Depression and anxiety: Zoloft 100 mg daily. Xanax 0.5 mg BID. This is filled by her PCP.   Leg Edema:  Lasix 20 mg QD without issue.    ----------------------------------------------------------------- Last [id number] Last mammogram:10/15/2017 Last colonoscopy:11/21/2009  Review of Systems  Constitutional: Negative.   HENT: Negative.   Eyes: Negative.   Respiratory: Negative.   Cardiovascular: Positive for leg swelling (sometimes per pt).  Gastrointestinal: Negative.   Endocrine: Negative.   Genitourinary: Positive for enuresis.  Musculoskeletal: Negative.   Skin: Negative.   Allergic/Immunologic: Negative.   Neurological: Negative.   Hematological: Negative.   Psychiatric/Behavioral: The patient is nervous/anxious.     Social History She  reports that she has never smoked. She has never used smokeless tobacco. She reports current alcohol use. She reports that she does not use drugs. Social History   Socioeconomic History  .  Marital status: Married    Spouse name: Not on file  . Number of children: Not on file  . Years of education: Not on file  . Highest education level: Not on file  Occupational History  . Not on file  Tobacco Use  . Smoking status: Never Smoker  . Smokeless tobacco: Never Used  Substance and Sexual Activity  . Alcohol use: Yes    Alcohol/week: 0.0 standard drinks    Comment: 2-4/week  . Drug use: No  . Sexual activity: Not on file  Other Topics Concern  . Not on file  Social History Narrative  . Not on file   Social Determinants of Health   Financial Resource Strain:   . Difficulty of Paying Living Expenses: Not on file  Food Insecurity:   . Worried About Charity fundraiser in the Last Year: Not on file  . Ran Out of Food in the Last Year: Not on file  Transportation Needs:   . Lack of Transportation (Medical): Not on file  . Lack of Transportation (Non-Medical): Not on file  Physical Activity:   . Days of Exercise per Week: Not on file  . Minutes of Exercise per Session: Not on file  Stress:   . Feeling of Stress : Not on file  Social Connections:   . Frequency of Communication with Friends and Family: Not on file  . Frequency of Social Gatherings with Friends and Family: Not on file  . Attends  Religious Services: Not on file  . Active Member of Clubs or Organizations: Not on file  . Attends Archivist Meetings: Not on file  . Marital Status: Not on file    Patient Active Problem List   Diagnosis Date Noted  . Aftercare 09/13/2018  . Tear of triangular fibrocartilage 09/13/2018  . Abscess of axilla, right 09/09/2015  . Abortion, spontaneous 05/31/2015  . Edema leg 05/31/2015  . Chest discomfort 05/31/2015  . Cough 05/31/2015  . Diabetes mellitus ([place]) 05/31/2015  . Accumulation of fluid in tissues 05/31/2015  . Big thyroid 05/31/2015  . Cardiac murmur 05/31/2015  . Hot flash, menopausal 05/31/2015  . LBP (low back pain) 05/31/2015  . Acute  thoracic back pain 05/31/2015  . Multinodular goiter 05/31/2015  . Adiposity 05/31/2015  . Abnormal blood sugar 05/31/2015  . Cutaneous eruption 05/31/2015  . Avitaminosis D 05/31/2015  . Eruption cyst 05/31/2015  . Anxiety 02/15/2015  . Accessory skin tags 11/29/2014  . Esophagitis, reflux 04/27/2008  . Other general symptoms and signs 10/05/2006  . Pure hypercholesterolemia 09/29/2006  . 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 Acute stress disorder 02/18/2006  . 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 Bone/cartilage disorder 07/29/2005  . Benign essential HTN 07/10/2005    Past Surgical History:  Procedure Laterality Date  . ABDOMINAL HYSTERECTOMY  2002  . APPENDECTOMY  2013   laparascopic with removal of tubal remnant  . COLONOSCOPY  2015  . KNEE SURGERY  2012  . LAPAROSCOPIC OOPHORECTOMY  2012  . WRIST ARTHROSCOPY WITH DEBRIDEMENT Right 08/30/2018   Procedure: WRIST ARTHROSCOPY WITH DEBRIDEMENT AND SYNOVECTOMY;  Surgeon: Roseanne Kaufman, MD;  Location: [facility];  Service: Orthopedics;  Laterality: Right;    Family History  Family Status  Relation Name Status  . Mother  Deceased  . Father  Alive  . Brother  Alive  . PGM  Deceased  . PGF  Deceased  . MGM  Deceased  . MGF  Deceased  . Sister  Alive  . Ethlyn Daniels  (Not Specified)   Her family history includes Breast cancer in her paternal aunt; Cancer in her mother; Dementia in her paternal grandfather; Heart disease in her father; Heart failure in her mother and paternal grandmother; Hypertension in her brother and father.     Allergies  Allergen Reactions  . Chlorpheniramine-Phenylephrine Hives  . Other Hives    actifed  . Sulfa Antibiotics   . Sulfur Other (See Comments)    Low b/p    Previous Medications   ALPRAZOLAM (XANAX) 0.5 MG TABLET    TAKE 1 TABLET (0.5 MG TOTAL) BY MOUTH 2 (TWO) TIMES DAILY AS NEEDED FOR ANXIETY.   AMLODIPINE-VALSARTAN-HCTZ 10-160-12.5  MG TABS    TAKE 1 TABLET DAILY FOR    HYPERTENSION   ASPIRIN 81 MG TABLET    Take 81 mg by mouth daily.   ASPIRIN BUF,CACARB-MGCARB-MGO, 81 MG TABS    aspirin 81 mg tablet   81 mg by oral route.   CALCIUM CARBONATE-VITAMIN D 600-200 MG-UNIT TABS       CHOLECALCIFEROL (VITAMIN D-3) 1000 UNITS CAPS    Take by mouth daily.   EC-NAPROXEN 500 MG EC TABLET    TAKE 1 TABLET (500 MG TOTAL) BY MOUTH 2 (TWO) TIMES DAILY WITH A MEAL.   FUROSEMIDE (LASIX) 20 MG TABLET    TAKE 1 TABLET DAILY AS     NEEDED FOR EDEMA. DUE FOR  FOLLOW UP LABS  OMEGA-3 FATTY ACIDS (FISH OIL DOUBLE STRENGTH) 1200 MG CAPS    Take by mouth.   OMEPRAZOLE (PRILOSEC) 40 MG CAPSULE    TAKE 1 CAPSULE DAILY FOR   REFLUX ESOPHAGITIS WITH    GASTRITIS   POTASSIUM CITRATE PO    Take by mouth daily.   SERTRALINE (ZOLOFT) 100 MG TABLET    Take 1 tablet (100 mg total) by mouth daily.    Patient Care Team: [place], Vickki Muff, PA as PCP - General (Family Medicine) Christene Lye, MD (General Surgery) Bary Castilla [place] Gleason, MD (General Surgery)      Objective:   Vitals: BP 122/68 (BP Location: Left Arm, Patient Position: Sitting, Cuff Size: Large)   Pulse 76   Temp (!) 97.3 F (36.3 C) (Temporal)   Ht 5\' 9"  (1.753 m)   Wt 262 lb 6.4 oz (119 kg)   SpO2 98%   BMI 38.75 kg/m    Physical Exam Constitutional:      Appearance: Normal appearance.  Cardiovascular:     Rate and Rhythm: Normal rate and regular rhythm.     Heart sounds: Normal heart sounds.  Pulmonary:     Effort: Pulmonary effort is normal.     Breath sounds: Normal breath sounds.  Skin:    General: Skin is warm and dry.  Neurological:     Mental Status: She is alert.      Depression Screen PHQ 2/9 Scores 10/18/2019 10/21/2017 11/10/2016 09/06/2015  PHQ - 2 Score 0 3 2 0  PHQ- 9 Score 0 5 8 -      Assessment & Plan:     Routine Health Maintenance and Physical Exam  Exercise Activities and Dietary recommendations Goals   None     Immunization  History  Administered Date(s) Administered  . Influenza-Unspecified 05/11/2015  . Tdap 05/28/2011    Health Maintenance  Topic Date Due  . Hepatitis C Screening  [birth year]  . PNEUMOCOCCAL POLYSACCHARIDE VACCINE AGE 41-64 HIGH RISK  11/09/1962  . OPHTHALMOLOGY EXAM  11/09/1970  . HIV Screening  11/09/1975  . FOOT EXAM  11/10/2017  . HEMOGLOBIN A1C  04/23/2018  . INFLUENZA VACCINE  03/11/2019  . MAMMOGRAM  10/16/2019  . COLONOSCOPY  11/22/2019  . TETANUS/TDAP  05/27/2021     Discussed health benefits of physical activity, and encouraged her to engage in regular exercise appropriate for her age and condition.    1. Annual physical exam  Ordered mammogram. As she is getting her second COVID vaccine, advised her to wait a couple of month after so there is not lymphadenopathy.   - TSH - Lipid panel - Comprehensive metabolic panel - CBC with Differential/Platelet  2. Type 2 diabetes mellitus without complication, without long-term current use of insulin (HCC)  Controlled. Instructed she needs a diabetic eye exam yearly as she has CONTROLLED diabetes. She has an appointment with [facility] next month and she has been instructed to inform them she has diabetes in order to get a dilated eye exam. She is not on a statin yet and her LDL is above goal for somebody with diabetes. Would recommend Lipitor 10 mg QHS.   - HgB A1c - Urine Microalbumin w/creat. ratio  3. Encounter for screening mammogram for malignant neoplasm of breast  - MM Digital Screening; Future  4. Colon cancer screening  - Ambulatory referral to Gastroenterology  5. Depression with anxiety  Filled Xanax Rx while PCP was out but instructed future refills will need to come from PCP.  Instructed she will need to follow up once every 6 months for controlled substances per clinic policy. She has been scheduled for a follow up.   - sertraline (ZOLOFT) 100 MG tablet; Take 1 tablet (100 mg total) by mouth daily.   Dispense: 90 tablet; Refill: 1  6. Benign essential HTN  - amLODIPine-Valsartan-HCTZ 10-160-12.5 MG TABS; Take 1 tablet by mouth daily. for high blood pressure  Dispense: 90 tablet; Refill: 1  7. Bilateral edema of lower extremity  - furosemide (LASIX) 20 MG tablet; TAKE 1 TABLET DAILY AS     NEEDED FOR EDEMA. DUE FOR  FOLLOW UP LABS  Dispense: 90 tablet; Refill: 1  The entirety of the information documented in the History of Present Illness, Review of Systems and Physical Exam were personally obtained by me. Portions of this information were initially documented by [facility] and reviewed by me for thoroughness and accuracy.   F/u 6 months chronic  --------------------------------------------------------------------

## 2019-10-19 ENCOUNTER — Telehealth: Payer: Self-pay

## 2019-10-19 DIAGNOSIS — R718 Other abnormality of red blood cells: Secondary | ICD-10-CM

## 2019-10-19 LAB — MICROALBUMIN / CREATININE URINE RATIO
Creatinine, Urine: 187.8 mg/dL
Microalb/Creat Ratio: 3 mg/g creat (ref 0–29)
Microalbumin, Urine: 6 ug/mL

## 2019-10-19 LAB — COMPREHENSIVE METABOLIC PANEL
ALT: 42 IU/L — ABNORMAL HIGH (ref 0–32)
AST: 42 IU/L — ABNORMAL HIGH (ref 0–40)
Albumin/Globulin Ratio: 1.6 (ref 1.2–2.2)
Albumin: 4.1 g/dL (ref 3.8–4.9)
Alkaline Phosphatase: 96 IU/L (ref 39–117)
BUN/Creatinine Ratio: 17 (ref 9–23)
BUN: 17 mg/dL (ref 6–24)
Bilirubin Total: 0.3 mg/dL (ref 0.0–1.2)
CO2: 25 mmol/L (ref 20–29)
Calcium: 9.6 mg/dL (ref 8.7–10.2)
Chloride: 103 mmol/L (ref 96–106)
Creatinine, Ser: 1 mg/dL (ref 0.57–1.00)
GFR calc Af Amer: 72 mL/min/{1.73_m2} (ref >59)
GFR calc non Af Amer: 62 mL/min/{1.73_m2} (ref >59)
Globulin, Total: 2.6 g/dL (ref 1.5–4.5)
Glucose: 102 mg/dL — ABNORMAL HIGH (ref 65–99)
Potassium: 3.7 mmol/L (ref 3.5–5.2)
Sodium: 141 mmol/L (ref 134–144)
Total Protein: 6.7 g/dL (ref 6.0–8.5)

## 2019-10-19 LAB — CBC WITH DIFFERENTIAL/PLATELET
Basophils Absolute: 0 10*3/uL (ref 0.0–0.2)
Basos: 0 %
EOS (ABSOLUTE): 0.2 10*3/uL (ref 0.0–0.4)
Eos: 2 %
Hematocrit: 37.1 % (ref 34.0–46.6)
Hemoglobin: 11.5 g/dL (ref 11.1–15.9)
Immature Grans (Abs): 0 10*3/uL (ref 0.0–0.1)
Immature Granulocytes: 0 %
Lymphocytes Absolute: 3.7 10*3/uL — ABNORMAL HIGH (ref 0.7–3.1)
Lymphs: 49 %
MCH: 24.1 pg — ABNORMAL LOW (ref 26.6–33.0)
MCHC: 31 g/dL — ABNORMAL LOW (ref 31.5–35.7)
MCV: 78 fL — ABNORMAL LOW (ref 79–97)
Monocytes Absolute: 0.7 10*3/uL (ref 0.1–0.9)
Monocytes: 10 %
Neutrophils Absolute: 3 10*3/uL (ref 1.4–7.0)
Neutrophils: 39 %
Platelets: 241 10*3/uL (ref 150–450)
RBC: 4.78 x10E6/uL (ref 3.77–5.28)
RDW: 13.7 % (ref 11.7–15.4)
WBC: 7.7 10*3/uL (ref 3.4–10.8)

## 2019-10-19 LAB — LIPID PANEL
Chol/HDL Ratio: 5.4 ratio — ABNORMAL HIGH (ref 0.0–4.4)
Cholesterol, Total: 188 mg/dL (ref 100–199)
HDL: 35 mg/dL — ABNORMAL LOW (ref >39)
LDL Chol Calc (NIH): 127 mg/dL — ABNORMAL HIGH (ref 0–99)
Triglycerides: 146 mg/dL (ref 0–149)
VLDL Cholesterol Cal: 26 mg/dL (ref 5–40)

## 2019-10-19 LAB — HEMOGLOBIN A1C
Est. average glucose Bld gHb Est-mCnc: 134 mg/dL
Hgb A1c MFr Bld: 6.3 % — ABNORMAL HIGH (ref 4.8–5.6)

## 2019-10-19 LAB — TSH: TSH: 1.45 u[IU]/mL (ref 0.450–4.500)

## 2019-10-19 NOTE — Telephone Encounter (Signed)
-----   Message from Trinna Post, [place] sent at 10/19/2019  8:28 AM EST ----- Can we add on an iron+TIBC+Fer under dx microcytosis? Thank you

## 2019-10-20 LAB — SPECIMEN STATUS REPORT

## 2019-10-20 LAB — IRON,TIBC AND FERRITIN PANEL
Ferritin: 55 ng/mL (ref 15–150)
Iron Saturation: 10 % — ABNORMAL LOW (ref 15–55)
Iron: 36 ug/dL (ref 27–159)
Total Iron Binding Capacity: 374 ug/dL (ref 250–450)
UIBC: 338 ug/dL (ref 131–425)

## 2019-10-24 ENCOUNTER — Encounter: Payer: Self-pay | Admitting: Physician Assistant

## 2019-10-24 DIAGNOSIS — E785 Hyperlipidemia, unspecified: Secondary | ICD-10-CM

## 2019-10-24 DIAGNOSIS — E1169 Type 2 diabetes mellitus with other specified complication: Secondary | ICD-10-CM

## 2019-10-25 MED ORDER — ATORVASTATIN CALCIUM 10 MG PO TABS: 10.0000 mg | ORAL_TABLET | Freq: Every day | ORAL | 1 refills | Status: DC

## 2019-10-26 ENCOUNTER — Other Ambulatory Visit: Payer: Self-pay | Admitting: Family Medicine

## 2019-10-26 DIAGNOSIS — F418 Other specified anxiety disorders: Secondary | ICD-10-CM

## 2019-10-26 MED ORDER — ALPRAZOLAM 0.5 MG PO TABS: 0.5000 mg | ORAL_TABLET | Freq: Two times a day (BID) | ORAL | 0 refills | Status: DC | PRN

## 2019-11-12 ENCOUNTER — Other Ambulatory Visit: Payer: Self-pay | Admitting: Family Medicine

## 2019-11-13 ENCOUNTER — Encounter: Payer: Self-pay | Admitting: Family Medicine

## 2019-11-13 ENCOUNTER — Telehealth: Payer: Self-pay

## 2019-11-13 MED ORDER — NAPROXEN 500 MG PO TBEC: 500.0000 mg | DELAYED_RELEASE_TABLET | Freq: Two times a day (BID) | ORAL | 2 refills | Status: DC

## 2019-11-13 NOTE — Telephone Encounter (Signed)
Our chart record indicates the prescription was sent as EC-Naproxen 500 mg one tablet twice a day #60 & 2 RF.

## 2019-11-13 NOTE — Telephone Encounter (Signed)
Pt calling - states pharmacy hasn't heard from anyone about this yet.  Pt states she is completely out of medication and her gout is hurting her very badly.  Pt would like a call to let her know what is going on.

## 2019-11-13 NOTE — Telephone Encounter (Signed)
Copied from [place] [id number]. Topic: General - Other >> Nov 13, 2019  9:24 AM Rainey Pines A wrote: Patient stated that the pharmacy stated that her medication was sent over but not the dosage for her naproxen. Please advise

## 2019-11-14 ENCOUNTER — Other Ambulatory Visit: Payer: Self-pay | Admitting: Family Medicine

## 2019-11-14 ENCOUNTER — Other Ambulatory Visit: Payer: Self-pay

## 2019-11-14 MED ORDER — NAPROXEN SODIUM 550 MG PO TABS: 550.0000 mg | ORAL_TABLET | Freq: Two times a day (BID) | ORAL | 2 refills | Status: DC

## 2019-11-14 NOTE — Telephone Encounter (Signed)
Sent for the plain 550 mg Naproxen BID that the computer said was covered/preferred.

## 2019-11-14 NOTE — Telephone Encounter (Signed)
Patient calling back about this requesting prednisone and colchicine  because pharmacy will not have this medication ready until Wednesday.    Pharmacy:  [id number] [address], [place] Phone:  [phone]  Fax:  [phone]

## 2019-11-14 NOTE — Telephone Encounter (Signed)
I called and spoke with pharmacy who advised me that the Naproxen prescription that was sent in was for extended release and that medication has been on back order for 2 years. She also mentioned that it is not covered by insurance. Pharmacist is requesting that we resend medication for the plain Naproxen.

## 2019-11-14 NOTE — Telephone Encounter (Signed)
Patient and pharmacist advised.

## 2019-11-16 ENCOUNTER — Ambulatory Visit: Payer: BC Managed Care – PPO | Admitting: Physician Assistant

## 2019-11-16 ENCOUNTER — Encounter: Payer: Self-pay | Admitting: Family Medicine

## 2019-11-16 ENCOUNTER — Other Ambulatory Visit: Payer: Self-pay

## 2019-11-16 ENCOUNTER — Encounter: Payer: Self-pay | Admitting: Physician Assistant

## 2019-11-16 VITALS — BP 148/82 | HR 81 | Temp 96.8°F | Wt 265.2 lb

## 2019-11-16 DIAGNOSIS — M109 Gout, unspecified: Secondary | ICD-10-CM | POA: Diagnosis not present

## 2019-11-16 MED ORDER — COLCHICINE 0.6 MG PO TABS: ORAL_TABLET | ORAL | 1 refills | Status: DC

## 2019-11-16 NOTE — Progress Notes (Signed)
Patient: Miranda Moore Female    DOB: [birth year]   59 y.o.   MRN: [id number] Visit Date: 11/16/2019  Today's Provider: Trinna Post, PA-C   Chief Complaint  Patient presents with  . Foot Pain   Subjective:    I, Porsha McClurkin,CMA am acting as a scribe for [employer].  HPI  Foot Pain Patient presents today for left foot pain since Saturday,11/11/2019. Patient reports that her foot is painful and has swelling. She reports that it painful to walk on. She has had several episodes of gout in the past year. She has a history of diabetes, is on HCTZ. She had a birthday party over the weekend where she consumed alcohol and seafood. She has tried naproxen and it didn't work.    Allergies  Allergen Reactions  . Chlorpheniramine-Phenylephrine Hives  . Other Hives    actifed  . Sulfa Antibiotics   . Sulfur Other (See Comments)    Low b/p     Current Outpatient Medications:  .  ALPRAZolam (XANAX) 0.5 MG tablet, Take 1 tablet (0.5 mg total) by mouth 2 (two) times daily as needed for anxiety., Disp: 60 tablet, Rfl: 0 .  amLODIPine-Valsartan-HCTZ 10-160-12.5 MG TABS, Take 1 tablet by mouth daily. for high blood pressure, Disp: 90 tablet, Rfl: 1 .  aspirin 81 MG tablet, Take 81 mg by mouth daily., Disp: , Rfl:  .  Aspirin Buf,CaCarb-MgCarb-MgO, 81 MG TABS, aspirin 81 mg tablet   81 mg by oral route., Disp: , Rfl:  .  atorvastatin (LIPITOR) 10 MG tablet, Take 1 tablet (10 mg total) by mouth daily., Disp: 90 tablet, Rfl: 1 .  Calcium Carbonate-Vitamin D 600-200 MG-UNIT TABS, , Disp: , Rfl:  .  Cholecalciferol (VITAMIN D-3) 1000 UNITS CAPS, Take by mouth daily., Disp: , Rfl:  .  furosemide (LASIX) 20 MG tablet, TAKE 1 TABLET DAILY AS     NEEDED FOR EDEMA. DUE FOR  FOLLOW UP LABS, Disp: 90 tablet, Rfl: 1 .  naproxen sodium (ANAPROX) 550 MG tablet, Take 1 tablet (550 mg total) by mouth 2 (two) times daily with a meal., Disp: 60 tablet, Rfl: 2 .  Omega-3 Fatty Acids (FISH OIL  DOUBLE STRENGTH) 1200 MG CAPS, Take by mouth., Disp: , Rfl:  .  omeprazole (PRILOSEC) 40 MG capsule, TAKE 1 CAPSULE DAILY FOR   REFLUX ESOPHAGITIS WITH    GASTRITIS, Disp: 90 capsule, Rfl: 3 .  POTASSIUM CITRATE PO, Take by mouth daily., Disp: , Rfl:  .  sertraline (ZOLOFT) 100 MG tablet, Take 1 tablet (100 mg total) by mouth daily., Disp: 90 tablet, Rfl: 1  Review of Systems  Constitutional: Negative.   Cardiovascular: Negative.   Musculoskeletal: Positive for arthralgias.  Skin: Negative.   Hematological: Negative.     Social History   Tobacco Use  . Smoking status: Never Smoker  . Smokeless tobacco: Never Used  Substance Use Topics  . Alcohol use: Yes    Alcohol/week: 0.0 standard drinks    Comment: 2-4/week      Objective:   BP (!) 148/82 (BP Location: Right Arm, Patient Position: Sitting, Cuff Size: Normal)   Pulse 81   Temp (!) 96.8 F (36 C) (Temporal)   Wt 265 lb 3.2 oz (120.3 kg)   SpO2 98%   BMI 39.16 kg/m  Vitals:   11/16/19 1348  BP: (!) 148/82  Pulse: 81  Temp: (!) 96.8 F (36 C)  TempSrc: Temporal  SpO2: 98%  Weight: 265 lb 3.2 oz (120.3 kg)  Body mass index is 39.16 kg/m.   Physical Exam Constitutional:      Appearance: Normal appearance.  Cardiovascular:     Rate and Rhythm: Normal rate.  Pulmonary:     Effort: Pulmonary effort is normal.  Musculoskeletal:       Feet:  Skin:    General: Skin is warm and dry.  Neurological:     Mental Status: She is alert and oriented to person, place, and time. Mental status is at baseline.  Psychiatric:        Mood and Affect: Mood normal.        Behavior: Behavior normal.      No results found for any visits on 11/16/19.  Lab Results  Component Value Date   LABURIC 8.1 (H) 11/16/2019       Assessment & Plan    1. Acute gout of left foot, unspecified cause  Acute flare. Discussed acute treatment with colchicine. Discussed starting allopurinol to manage chronic elevation in uric acid.  Discussed prophylactic dose of colchicine along with allopurinol x 6 months. Follow up with PCP to recheck labs. Discussed role of HCTZ in exacerbating gout, however patient reports her BP was difficult to control and doesn't want to change this.   - colchicine 0.6 MG tablet; For ACUTE gout, take two tablets on day 1 and then one hour later, take a third tablet to total 1.8 mg daily. For prevention, take one 0.6 mg tablet daily with allopurinol.  Dispense: 90 tablet; Refill: 1 - Uric acid - allopurinol (ZYLOPRIM) 100 MG tablet; Take 1 tablet (100 mg total) by mouth daily.  Dispense: 90 tablet; Refill: 1  The entirety of the information documented in the History of Present Illness, Review of Systems and Physical Exam were personally obtained by me. Portions of this information were initially documented by [facility], CMA and reviewed by me for thoroughness and accuracy.         Trinna Post, PA-C  [facility]

## 2019-11-16 NOTE — Patient Instructions (Signed)
 Gout  Gout is painful swelling of your joints. Gout is a type of arthritis. It is caused by having too much uric acid in your body. Uric acid is a chemical that is made when your body breaks down substances called purines. If your body has too much uric acid, sharp crystals can form and build up in your joints. This causes pain and swelling. Gout attacks can happen quickly and be very painful (acute gout). Over time, the attacks can affect more joints and happen more often (chronic gout). What are the causes?  Too much uric acid in your blood. This can happen because: ? Your kidneys do not remove enough uric acid from your blood. ? Your body makes too much uric acid. ? You eat too many foods that are high in purines. These foods include organ meats, some seafood, and beer.  Trauma or stress. What increases the risk?  Having a family history of gout.  Being female and middle-aged.  Being female and having gone through menopause.  Being very overweight (obese).  Drinking alcohol, especially beer.  Not having enough water in the body (being dehydrated).  Losing weight too quickly.  Having an organ transplant.  Having lead poisoning.  Taking certain medicines.  Having kidney disease.  Having a skin condition called psoriasis. What are the signs or symptoms? An attack of acute gout usually happens in just one joint. The most common place is the big toe. Attacks often start at night. Other joints that may be affected include joints of the feet, ankle, knee, fingers, wrist, or elbow. Symptoms of an attack may include:  Very bad pain.  Warmth.  Swelling.  Stiffness.  Shiny, red, or purple skin.  Tenderness. The affected joint may be very painful to touch.  Chills and fever. Chronic gout may cause symptoms more often. More joints may be involved. You may also have white or yellow lumps (tophi) on your hands or feet or in other areas near your joints. How is this  treated?  Treatment for this condition has two phases: treating an acute attack and preventing future attacks.  Acute gout treatment may include: ? NSAIDs. ? Steroids. These are taken by mouth or injected into a joint. ? Colchicine. This medicine relieves pain and swelling. It can be given by mouth or through an IV tube.  Preventive treatment may include: ? Taking small doses of NSAIDs or colchicine daily. ? Using a medicine that reduces uric acid levels in your blood. ? Making changes to your diet. You may need to see a food expert (dietitian) about what to eat and drink to prevent gout. Follow these instructions at home: During a gout attack   If told, put ice on the painful area: ? Put ice in a plastic bag. ? Place a towel between your skin and the bag. ? Leave the ice on for 20 minutes, 2-3 times a day.  Raise (elevate) the painful joint above the level of your heart as often as you can.  Rest the joint as much as possible. If the joint is in your leg, you may be given crutches.  Follow instructions from your doctor about what you cannot eat or drink. Avoiding future gout attacks  Eat a low-purine diet. Avoid foods and drinks such as: ? Liver. ? Kidney. ? Anchovies. ? Asparagus. ? Herring. ? Mushrooms. ? Mussels. ? Beer.  Stay at a healthy weight. If you want to lose weight, talk with your doctor. Do not lose   weight too fast.  Start or continue an exercise plan as told by your doctor. Eating and drinking  Drink enough fluids to keep your pee (urine) pale yellow.  If you drink alcohol: ? Limit how much you use to:  0-1 drink a day for women.  0-2 drinks a day for men. ? Be aware of how much alcohol is in your drink. In the [place] one drink equals one 12 oz bottle of beer (355 mL), one 5 oz glass of wine (148 mL), or one 1 oz glass of hard liquor (44 mL). General instructions  Take over-the-counter and prescription medicines only as told by your doctor.  Do  not drive or use heavy machinery while taking prescription pain medicine.  Return to your normal activities as told by your doctor. Ask your doctor what activities are safe for you.  Keep all follow-up visits as told by your doctor. This is important. Contact a doctor if:  You have another gout attack.  You still have symptoms of a gout attack after 10 days of treatment.  You have problems (side effects) because of your medicines.  You have chills or a fever.  You have burning pain when you pee (urinate).  You have pain in your lower back or belly. Get help right away if:  You have very bad pain.  Your pain cannot be controlled.  You cannot pee. Summary  Gout is painful swelling of the joints.  The most common site of pain is the big toe, but it can affect other joints.  Medicines and avoiding some foods can help to prevent and treat gout attacks. This information is not intended to replace advice given to you by your health care provider. Make sure you discuss any questions you have with your health care provider. Document Revised: 02/16/2018 Document Reviewed: 02/16/2018 Elsevier Patient Education  2020 Elsevier Inc.  

## 2019-11-17 LAB — URIC ACID: Uric Acid: 8.1 mg/dL — ABNORMAL HIGH (ref 3.0–7.2)

## 2019-11-17 MED ORDER — ALLOPURINOL 100 MG PO TABS: 100.0000 mg | ORAL_TABLET | Freq: Every day | ORAL | 1 refills | Status: DC

## 2019-11-22 ENCOUNTER — Telehealth: Payer: Self-pay

## 2019-11-22 ENCOUNTER — Telehealth: Payer: Self-pay | Admitting: Family Medicine

## 2019-11-22 DIAGNOSIS — I10 Essential (primary) hypertension: Secondary | ICD-10-CM

## 2019-11-22 MED ORDER — VALSARTAN 160 MG PO TABS: 160.0000 mg | ORAL_TABLET | Freq: Every day | ORAL | 0 refills | Status: DC

## 2019-11-22 MED ORDER — HYDROCHLOROTHIAZIDE 12.5 MG PO TABS: 12.5000 mg | ORAL_TABLET | Freq: Every day | ORAL | 0 refills | Status: DC

## 2019-11-22 MED ORDER — AMLODIPINE BESYLATE 10 MG PO TABS: 10.0000 mg | ORAL_TABLET | Freq: Every day | ORAL | 0 refills | Status: DC

## 2019-11-22 NOTE — Telephone Encounter (Signed)
Copied from [place] [id number]. Topic: General - Other >> Nov 22, 2019  4:26 PM Alanda Slim E wrote: Reason for CRM: Pt returned office call/ please advise

## 2019-11-22 NOTE — Telephone Encounter (Signed)
Pharmacy send a fax stating that patients AML/VAL/HCTZ 10-160-12.5 MG tabs is manufacturer backordered. Patient was notified of changes as well via voice message.

## 2019-11-22 NOTE — Telephone Encounter (Signed)
Returned patient call and she states that she did not need any refills on her medication and she have enough blood pressure medicine. Patient request that I cancel the prescription requests that was send into pharmacy and stated that when she needs a refill she will let the office know. I advised her that [facility] states that her blood pressure medicine manufacturer is on back order and she still wanted the medication cancelled.FYI

## 2019-11-22 NOTE — Telephone Encounter (Signed)
Pharmacy called stating that the pts combo pill for amlodipine is currently on back order with no expected date. They are requesting to know next steps. Please advise.     [id number] [name], [name] AT Portal to Registered [name] [address]  Phone: [phone] Fax: [phone]  Not a 24 hour pharmacy; exact hours not known.

## 2019-11-23 ENCOUNTER — Other Ambulatory Visit: Payer: Self-pay | Admitting: Family Medicine

## 2019-11-23 DIAGNOSIS — I10 Essential (primary) hypertension: Secondary | ICD-10-CM

## 2019-11-23 MED ORDER — VALSARTAN-HYDROCHLOROTHIAZIDE 160-12.5 MG PO TABS: 1.0000 | ORAL_TABLET | Freq: Every day | ORAL | 0 refills | Status: DC

## 2019-11-23 NOTE — Progress Notes (Signed)
Amlodipine-Valsartan-HCTZ 10-160-12.5 mg qd is on back order. Sent Amlodipine 10 mg qd with Diovan 160 mg qd and HCTZ 12.5 mg qd to cover in three pills.

## 2019-11-23 NOTE — Telephone Encounter (Signed)
Looks like Miranda Moore called in the Amlodipine 10 mg qd with Diovan 160 mg qd and HCTZ 12.5 mg qd to [facility].

## 2019-12-08 NOTE — Progress Notes (Signed)
This encounter was created in error - please disregard.

## 2019-12-14 ENCOUNTER — Other Ambulatory Visit: Payer: Self-pay | Admitting: Family Medicine

## 2019-12-14 ENCOUNTER — Other Ambulatory Visit: Payer: Self-pay | Admitting: Physician Assistant

## 2019-12-14 DIAGNOSIS — R600 Localized edema: Secondary | ICD-10-CM

## 2020-01-26 ENCOUNTER — Other Ambulatory Visit: Payer: Self-pay | Admitting: Family Medicine

## 2020-01-26 DIAGNOSIS — F418 Other specified anxiety disorders: Secondary | ICD-10-CM

## 2020-01-26 DIAGNOSIS — I10 Essential (primary) hypertension: Secondary | ICD-10-CM

## 2020-01-26 NOTE — Telephone Encounter (Signed)
Requested  medications are  due for refill today yes  Requested medications are on the active medication list yes  Last refill 3/18  Future visit scheduled Yes Sept  Last visit March  Notes to clinic Not Delegated

## 2020-01-29 ENCOUNTER — Telehealth: Payer: Self-pay

## 2020-01-29 NOTE — Telephone Encounter (Signed)
Spoke with patient on the phone who states that she had concerns of being on Amlodipine-Valsartan-and HCTZ at the same time. Patient states that pharmacist told her today that Amlodipine was on back order and would not be able to fill prescription at this time. Patient wanted to know side effects of Amlodipine and if it can cause symptoms such as swelling? KW

## 2020-01-29 NOTE — Telephone Encounter (Signed)
It can be a possible side effect, but it is not a fluid build up. Just a dilation of vessels and that is how it helps blood pressure. Check with a different pharmacy for availability until production comes back up.

## 2020-01-29 NOTE — Telephone Encounter (Signed)
Copied from [place] [phone]. Topic: General - Other >> Jan 29, 2020  4:01 PM Hinda Lenis D wrote: Reason for CRM: PT has questions about her blood pressure medication / please advise

## 2020-01-30 NOTE — Telephone Encounter (Signed)
Patient has been advised. KW 

## 2020-04-04 ENCOUNTER — Other Ambulatory Visit: Payer: Self-pay | Admitting: Family Medicine

## 2020-04-04 DIAGNOSIS — Z1231 Encounter for screening mammogram for malignant neoplasm of breast: Secondary | ICD-10-CM

## 2020-04-05 ENCOUNTER — Ambulatory Visit
Admission: RE | Admit: 2020-04-05 | Discharge: 2020-04-05 | Disposition: A | Discharge status: Home / Self Care | Payer: BC Managed Care – PPO | Source: Ambulatory Visit | Attending: Family Medicine | Admitting: Family Medicine

## 2020-04-05 ENCOUNTER — Other Ambulatory Visit: Payer: Self-pay

## 2020-04-05 DIAGNOSIS — Z1231 Encounter for screening mammogram for malignant neoplasm of breast: Secondary | ICD-10-CM | POA: Diagnosis present

## 2020-04-19 ENCOUNTER — Encounter: Payer: Self-pay | Admitting: Physician Assistant

## 2020-04-19 ENCOUNTER — Other Ambulatory Visit: Payer: Self-pay

## 2020-04-19 ENCOUNTER — Ambulatory Visit: Payer: BC Managed Care – PPO | Admitting: Family Medicine

## 2020-04-19 ENCOUNTER — Ambulatory Visit (INDEPENDENT_AMBULATORY_CARE_PROVIDER_SITE_OTHER): Payer: BC Managed Care – PPO | Admitting: Physician Assistant

## 2020-04-19 VITALS — BP 132/69 | HR 72 | Ht 69.0 in | Wt 270.8 lb

## 2020-04-19 DIAGNOSIS — M109 Gout, unspecified: Secondary | ICD-10-CM | POA: Diagnosis not present

## 2020-04-19 DIAGNOSIS — E785 Hyperlipidemia, unspecified: Secondary | ICD-10-CM

## 2020-04-19 DIAGNOSIS — E119 Type 2 diabetes mellitus without complications: Secondary | ICD-10-CM | POA: Diagnosis not present

## 2020-04-19 DIAGNOSIS — I10 Essential (primary) hypertension: Secondary | ICD-10-CM | POA: Diagnosis not present

## 2020-04-19 DIAGNOSIS — E1169 Type 2 diabetes mellitus with other specified complication: Secondary | ICD-10-CM

## 2020-04-19 DIAGNOSIS — Z1211 Encounter for screening for malignant neoplasm of colon: Secondary | ICD-10-CM

## 2020-04-19 NOTE — Progress Notes (Signed)
Established patient visit   Patient: Miranda Moore   DOB: 1960/11/26   59 y.o. Female  MRN: [id number] Visit Date: 04/19/2020  Today's healthcare provider: Trinna Post, PA-C   Chief Complaint  Patient presents with  . Diabetes   Subjective    HPI  Diabetes Mellitus Type II, Follow-up  Lab Results  Component Value Date   HGBA1C 7.0 (H) 04/19/2020   HGBA1C 6.3 (H) 10/18/2019   HGBA1C 6.1 (H) 10/21/2017   Wt Readings from Last 3 Encounters:  04/19/20 270 lb 12.8 oz (122.8 kg)  11/16/19 265 lb 3.2 oz (120.3 kg)  10/18/19 262 lb 6.4 oz (119 kg)   Last seen for diabetes 6 months ago.  Management since then includes no changes. She reports good compliance with treatment. She is not having side effects.  Symptoms: No fatigue No foot ulcerations  No appetite changes No nausea  No paresthesia of the feet  No polydipsia  No polyuria No visual disturbances   No vomiting     Home blood sugar records: fasting range: 140s  Episodes of hypoglycemia? No    Current insulin regiment: none Most Recent Eye Exam: She reports she saw Dr. Veronda Prude at [facility].  Current exercise: none Current diet habits: well balanced  Pertinent Labs: Lab Results  Component Value Date   CHOL 133 04/19/2020   HDL 35 (L) 04/19/2020   LDLCALC 79 04/19/2020   TRIG 103 04/19/2020   CHOLHDL 3.8 04/19/2020   Lab Results  Component Value Date   NA 141 10/18/2019   K 3.7 10/18/2019   CREATININE 1.00 10/18/2019   GFRNONAA 62 10/18/2019   GFRAA 72 10/18/2019   GLUCOSE 102 (H) 10/18/2019     Hypertension, follow-up  BP Readings from Last 3 Encounters:  04/19/20 132/69  11/16/19 (!) 148/82  10/18/19 122/68   Wt Readings from Last 3 Encounters:  04/19/20 270 lb 12.8 oz (122.8 kg)  11/16/19 265 lb 3.2 oz (120.3 kg)  10/18/19 262 lb 6.4 oz (119 kg)     She was last seen for hypertension 6 months ago.  BP at that visit was 122/68. Management since that visit includes no  changes.  She reports good compliance with treatment. She is not having side effects.  She is following a Regular diet. She is not exercising. She does not smoke.  Use of agents associated with hypertension: none.   Outside blood pressures are not being checked. Symptoms: No chest pain No chest pressure  No palpitations No syncope  No dyspnea No orthopnea  No paroxysmal nocturnal dyspnea No lower extremity edema   Pertinent labs: Lab Results  Component Value Date   CHOL 133 04/19/2020   HDL 35 (L) 04/19/2020   LDLCALC 79 04/19/2020   TRIG 103 04/19/2020   CHOLHDL 3.8 04/19/2020   Lab Results  Component Value Date   NA 141 10/18/2019   K 3.7 10/18/2019   CREATININE 1.00 10/18/2019   GFRNONAA 62 10/18/2019   GFRAA 72 10/18/2019   GLUCOSE 102 (H) 10/18/2019     The 10-year ASCVD risk score Mikey Bussing DC Jr., et al., 2013) is: 14.9%     Lipid/Cholesterol, Follow-up  Last lipid panel Other pertinent labs  Lab Results  Component Value Date   CHOL 133 04/19/2020   HDL 35 (L) 04/19/2020   LDLCALC 79 04/19/2020   TRIG 103 04/19/2020   CHOLHDL 3.8 04/19/2020   Lab Results  Component Value Date   ALT 42 (  H) 10/18/2019   AST 42 (H) 10/18/2019   PLT 241 10/18/2019   TSH 1.450 10/18/2019     She was last seen for this 6 months ago.  Management since that visit includes continue lipitor 10 mg QHS.  She reports good compliance with treatment. She is not having side effects.   Symptoms: No chest pain No chest pressure/discomfort  No dyspnea No lower extremity edema  No numbness or tingling of extremity No orthopnea  No palpitations No paroxysmal nocturnal dyspnea  No speech difficulty No syncope   Current diet: well balanced Current exercise: none  The 10-year ASCVD risk score Mikey Bussing DC Jr., et al., 2013) is: 14.9%   Gout: Currently on allopurinol 200 mg daily.     Medications: Outpatient Medications Prior to Visit  Medication Sig  . allopurinol (ZYLOPRIM) 100  MG tablet Take 1 tablet (100 mg total) by mouth daily.  Marland Kitchen ALPRAZolam (XANAX) 0.5 MG tablet TAKE 1 TABLET (0.5 MG TOTAL) BY MOUTH 2 (TWO) TIMES DAILY AS NEEDED FOR ANXIETY.  Marland Kitchen amLODipine (NORVASC) 10 MG tablet Take 1 tablet (10 mg total) by mouth daily.  Marland Kitchen amLODIPine-Valsartan-HCTZ 10-160-12.5 MG TABS TAKE 1 TABLET DAILY FOR    HYPERTENSION. DUE FOR      FOLLOW UP LABS  . atorvastatin (LIPITOR) 10 MG tablet Take 1 tablet (10 mg total) by mouth daily.  . Calcium Carbonate-Vitamin D 600-200 MG-UNIT TABS   . Cholecalciferol (VITAMIN D-3) 1000 UNITS CAPS Take by mouth daily.  . colchicine 0.6 MG tablet For ACUTE gout, take two tablets on day 1 and then one hour later, take a third tablet to total 1.8 mg daily. For prevention, take one 0.6 mg tablet daily with allopurinol.  . furosemide (LASIX) 20 MG tablet TAKE 1 TABLET DAILY AS     NEEDED FOR EDEMA. DUE FOR  FOLLOW UP LABS  . hydrochlorothiazide (HYDRODIURIL) 12.5 MG tablet Take 1 tablet (12.5 mg total) by mouth daily.  . Omega-3 Fatty Acids (FISH OIL DOUBLE STRENGTH) 1200 MG CAPS Take by mouth.  Marland Kitchen omeprazole (PRILOSEC) 40 MG capsule TAKE 1 CAPSULE DAILY FOR   REFLUX ESOPHAGITIS WITH    GASTRITIS  . POTASSIUM CITRATE PO Take by mouth daily.  . sertraline (ZOLOFT) 100 MG tablet Take 1 tablet (100 mg total) by mouth daily.  . valsartan (DIOVAN) 160 MG tablet Take 1 tablet (160 mg total) by mouth daily.  Marland Kitchen aspirin 81 MG tablet Take 81 mg by mouth daily. (Patient not taking: Reported on 04/19/2020)  . Aspirin Buf,CaCarb-MgCarb-MgO, 81 MG TABS aspirin 81 mg tablet   81 mg by oral route. (Patient not taking: Reported on 04/19/2020)  . naproxen sodium (ANAPROX) 550 MG tablet Take 1 tablet (550 mg total) by mouth 2 (two) times daily with a meal. (Patient not taking: Reported on 04/19/2020)   No facility-administered medications prior to visit.    Review of Systems  Constitutional: Negative.   Respiratory: Negative.   Cardiovascular: Negative.     Musculoskeletal: Negative.   Neurological: Negative.   Psychiatric/Behavioral: Negative.       Objective    BP 132/69   Pulse 72   Ht 5\' 9"  (1.753 m)   Wt 270 lb 12.8 oz (122.8 kg)   BMI 39.99 kg/m    Physical Exam Constitutional:      Appearance: She is obese.  Cardiovascular:     Rate and Rhythm: Regular rhythm.     Pulses: Normal pulses.     Heart sounds: Normal  heart sounds.  Pulmonary:     Effort: Pulmonary effort is normal.     Breath sounds: Normal breath sounds.  Skin:    General: Skin is warm and dry.  Neurological:     Mental Status: She is alert and oriented to person, place, and time. Mental status is at baseline.  Psychiatric:        Mood and Affect: Mood normal.        Behavior: Behavior normal.       Results for orders placed or performed in visit on 04/19/20  Uric acid  Result Value Ref Range   Uric Acid 6.0 3.0 - 7.2 mg/dL  HgB A1c  Result Value Ref Range   Hgb A1c MFr Bld 7.0 (H) 4.8 - 5.6 %   Est. average glucose Bld gHb Est-mCnc 154 mg/dL  Lipid Profile  Result Value Ref Range   Cholesterol, Total 133 100 - 199 mg/dL   Triglycerides 103 0 - 149 mg/dL   HDL 35 (L) >39 mg/dL   VLDL Cholesterol Cal 19 5 - 40 mg/dL   LDL Chol Calc (NIH) 79 0 - 99 mg/dL   Chol/HDL Ratio 3.8 0.0 - 4.4 ratio    Assessment & Plan    1. Type 2 diabetes mellitus without complication, without long-term current use of insulin (HCC)  Well controlled with last A1c 7.0% Continue current medications UTD on vaccines, eye exam, foot exam On ACEi On Statin Discussed diet and exercise F/u in 3 months  - HgB A1c  2. Benign essential HTN  Well controlled Continue current medications Recheck metabolic panel F/u in 3 months   3. Hyperlipidemia associated with type 2 diabetes mellitus ([facility])  Previously well controlled Continue statin Repeat FLP and CMP Goal LDL < 70  - Lipid Profile  4. Gout, unspecified cause, unspecified chronicity, unspecified  site  Continue allopurinol and adjust accordingly.   - Uric acid  5. Colon cancer screening  - Ambulatory referral to Gastroenterology    No follow-ups on file.      ITrinna Post, PA-C, have reviewed all documentation for this visit. The documentation on 04/22/20 for the exam, diagnosis, procedures, and orders are all accurate and complete.  The entirety of the information documented in the History of Present Illness, Review of Systems and Physical Exam were personally obtained by me. Portions of this information were initially documented by Wilburt Finlay, CMA and reviewed by me for thoroughness and accuracy.     Paulene Floor  [facility] [phone] (phone) [phone] (fax)  [place]

## 2020-04-20 LAB — LIPID PANEL
Chol/HDL Ratio: 3.8 ratio (ref 0.0–4.4)
Cholesterol, Total: 133 mg/dL (ref 100–199)
HDL: 35 mg/dL — ABNORMAL LOW (ref >39)
LDL Chol Calc (NIH): 79 mg/dL (ref 0–99)
Triglycerides: 103 mg/dL (ref 0–149)
VLDL Cholesterol Cal: 19 mg/dL (ref 5–40)

## 2020-04-20 LAB — HEMOGLOBIN A1C
Est. average glucose Bld gHb Est-mCnc: 154 mg/dL
Hgb A1c MFr Bld: 7 % — ABNORMAL HIGH (ref 4.8–5.6)

## 2020-04-20 LAB — URIC ACID: Uric Acid: 6 mg/dL (ref 3.0–7.2)

## 2020-04-26 ENCOUNTER — Other Ambulatory Visit: Payer: Self-pay | Admitting: Physician Assistant

## 2020-04-26 ENCOUNTER — Other Ambulatory Visit: Payer: Self-pay | Admitting: Family Medicine

## 2020-04-26 DIAGNOSIS — F418 Other specified anxiety disorders: Secondary | ICD-10-CM

## 2020-04-27 NOTE — Telephone Encounter (Signed)
Requested  medications are  due for refill today yes  Requested medications are on the active medication list yes  Last refill 01/30/20  Last visit Mar 2021  Future visit scheduled Dec 2021  Notes to clinic Not Delegated

## 2020-04-27 NOTE — Telephone Encounter (Signed)
Requested  medications are  due for refill today yes  Requested medications are on the active medication list yes  Last refill 6/18  Last visit Mar 2021  Future visit scheduled Dec2021  Notes to clinic Failed protocol of valid visit within 6 months

## 2020-05-01 ENCOUNTER — Encounter: Payer: Self-pay | Admitting: *Deleted

## 2020-05-07 ENCOUNTER — Other Ambulatory Visit: Payer: Self-pay

## 2020-05-07 DIAGNOSIS — Z1211 Encounter for screening for malignant neoplasm of colon: Secondary | ICD-10-CM

## 2020-05-07 MED ORDER — CLENPIQ 10-3.5-12 MG-GM -GM/160ML PO SOLN: ORAL | 0 refills | Status: DC

## 2020-05-13 ENCOUNTER — Other Ambulatory Visit: Payer: Self-pay

## 2020-05-21 ENCOUNTER — Encounter: Payer: Self-pay | Admitting: Gastroenterology

## 2020-05-27 ENCOUNTER — Other Ambulatory Visit: Payer: Self-pay | Admitting: Physician Assistant

## 2020-05-27 DIAGNOSIS — M109 Gout, unspecified: Secondary | ICD-10-CM

## 2020-05-30 ENCOUNTER — Other Ambulatory Visit: Admission: RE | Admit: 2020-05-30 | Payer: BC Managed Care – PPO | Source: Ambulatory Visit

## 2020-05-31 ENCOUNTER — Other Ambulatory Visit: Payer: Self-pay

## 2020-05-31 ENCOUNTER — Other Ambulatory Visit
Admission: RE | Admit: 2020-05-31 | Discharge: 2020-05-31 | Disposition: A | Discharge status: Home / Self Care | Payer: BC Managed Care – PPO | Source: Ambulatory Visit | Attending: Gastroenterology | Admitting: Gastroenterology

## 2020-05-31 DIAGNOSIS — Z01812 Encounter for preprocedural laboratory examination: Secondary | ICD-10-CM | POA: Diagnosis present

## 2020-05-31 DIAGNOSIS — Z20822 Contact with and (suspected) exposure to covid-19: Secondary | ICD-10-CM | POA: Diagnosis not present

## 2020-05-31 NOTE — Anesthesia Preprocedure Evaluation (Addendum)
Anesthesia Evaluation  Patient identified by MRN, date of birth, ID band Patient awake    Reviewed: Allergy & Precautions, NPO status , Patient's Chart, lab work & pertinent test results, reviewed documented beta blocker date and time   History of Anesthesia Complications Negative for: history of anesthetic complications  Airway Mallampati: III  TM Distance: >3 FB Neck ROM: Full    Dental   Pulmonary sleep apnea ,    breath sounds clear to auscultation       Cardiovascular hypertension, (-) angina(-) DOE  Rhythm:Regular Rate:Normal   HLD   Neuro/Psych PSYCHIATRIC DISORDERS Anxiety Depression    GI/Hepatic GERD  Controlled,  Endo/Other  diabetesMorbid obesity (BMI 40)  Renal/GU      Musculoskeletal   Abdominal   Peds  Hematology   Anesthesia Other Findings   Reproductive/Obstetrics                            Anesthesia Physical Anesthesia Plan  ASA: III  Anesthesia Plan: General   Post-op Pain Management:    Induction: Intravenous  PONV Risk Score and Plan: 3 and Propofol infusion, TIVA and Treatment may vary due to age or medical condition  Airway Management Planned: Natural Airway and Nasal Cannula  Additional Equipment:   Intra-op Plan:   Post-operative Plan:   Informed Consent: I have reviewed the patients History and Physical, chart, labs and discussed the procedure including the risks, benefits and alternatives for the proposed anesthesia with the patient or authorized representative who has indicated his/her understanding and acceptance.       Plan Discussed with: CRNA and Anesthesiologist  Anesthesia Plan Comments:         Anesthesia Quick Evaluation

## 2020-05-31 NOTE — Pre-Procedure Instructions (Signed)
Reinforced covid protocol of quarantining prior to procedure and after covid test completed.

## 2020-06-01 LAB — SARS CORONAVIRUS 2 (TAT 6-24 HRS): SARS Coronavirus 2: NEGATIVE

## 2020-06-03 NOTE — Discharge Instructions (Signed)
General Anesthesia, Adult, Care After This sheet gives you information about how to care for yourself after your procedure. Your health care provider may also give you more specific instructions. If you have problems or questions, contact your health care provider. What can I expect after the procedure? After the procedure, the following side effects are common:  Pain or discomfort at the IV site.  Nausea.  Vomiting.  Sore throat.  Trouble concentrating.  Feeling cold or chills.  Weak or tired.  Sleepiness and fatigue.  Soreness and body aches. These side effects can affect parts of the body that were not involved in surgery. Follow these instructions at home:  For at least 24 hours after the procedure:  Have a responsible adult stay with you. It is important to have someone help care for you until you are awake and alert.  Rest as needed.  Do not: ? Participate in activities in which you could fall or become injured. ? Drive. ? Use heavy machinery. ? Drink alcohol. ? Take sleeping pills or medicines that cause drowsiness. ? Make important decisions or sign legal documents. ? Take care of children on your own. Eating and drinking  Follow any instructions from your health care provider about eating or drinking restrictions.  When you feel hungry, start by eating small amounts of foods that are soft and easy to digest (bland), such as toast. Gradually return to your regular diet.  Drink enough fluid to keep your urine pale yellow.  If you vomit, rehydrate by drinking water, juice, or clear broth. General instructions  If you have sleep apnea, surgery and certain medicines can increase your risk for breathing problems. Follow instructions from your health care provider about wearing your sleep device: ? Anytime you are sleeping, including during daytime naps. ? While taking prescription pain medicines, sleeping medicines, or medicines that make you drowsy.  Return to  your normal activities as told by your health care provider. Ask your health care provider what activities are safe for you.  Take over-the-counter and prescription medicines only as told by your health care provider.  If you smoke, do not smoke without supervision.  Keep all follow-up visits as told by your health care provider. This is important. Contact a health care provider if:  You have nausea or vomiting that does not get better with medicine.  You cannot eat or drink without vomiting.  You have pain that does not get better with medicine.  You are unable to pass urine.  You develop a skin rash.  You have a fever.  You have redness around your IV site that gets worse. Get help right away if:  You have difficulty breathing.  You have chest pain.  You have blood in your urine or stool, or you vomit blood. Summary  After the procedure, it is common to have a sore throat or nausea. It is also common to feel tired.  Have a responsible adult stay with you for the first 24 hours after general anesthesia. It is important to have someone help care for you until you are awake and alert.  When you feel hungry, start by eating small amounts of foods that are soft and easy to digest (bland), such as toast. Gradually return to your regular diet.  Drink enough fluid to keep your urine pale yellow.  Return to your normal activities as told by your health care provider. Ask your health care provider what activities are safe for you. This information is not   intended to replace advice given to you by your health care provider. Make sure you discuss any questions you have with your health care provider. Document Revised: 07/30/2017 Document Reviewed: 03/12/2017 Elsevier Patient Education  2020 Elsevier Inc.  

## 2020-06-04 ENCOUNTER — Ambulatory Visit: Payer: BC Managed Care – PPO | Admitting: Anesthesiology

## 2020-06-04 ENCOUNTER — Ambulatory Visit
Admission: RE | Disposition: A | Discharge status: Home / Self Care | Payer: Self-pay | Source: Home / Self Care | Attending: Gastroenterology

## 2020-06-04 ENCOUNTER — Other Ambulatory Visit: Payer: Self-pay

## 2020-06-04 ENCOUNTER — Encounter: Payer: Self-pay | Admitting: Gastroenterology

## 2020-06-04 ENCOUNTER — Ambulatory Visit
Admission: RE | Admit: 2020-06-04 | Discharge: 2020-06-04 | Disposition: A | Discharge status: Home / Self Care | Payer: BC Managed Care – PPO | Attending: Gastroenterology | Admitting: Gastroenterology

## 2020-06-04 DIAGNOSIS — D120 Benign neoplasm of cecum: Secondary | ICD-10-CM | POA: Insufficient documentation

## 2020-06-04 DIAGNOSIS — K648 Other hemorrhoids: Secondary | ICD-10-CM | POA: Diagnosis not present

## 2020-06-04 DIAGNOSIS — Z1211 Encounter for screening for malignant neoplasm of colon: Secondary | ICD-10-CM | POA: Diagnosis present

## 2020-06-04 DIAGNOSIS — K635 Polyp of colon: Secondary | ICD-10-CM

## 2020-06-04 DIAGNOSIS — Z882 Allergy status to sulfonamides status: Secondary | ICD-10-CM | POA: Insufficient documentation

## 2020-06-04 DIAGNOSIS — Z888 Allergy status to other drugs, medicaments and biological substances status: Secondary | ICD-10-CM | POA: Diagnosis not present

## 2020-06-04 DIAGNOSIS — Z800 Family history of malignant neoplasm of digestive organs: Secondary | ICD-10-CM | POA: Diagnosis not present

## 2020-06-04 HISTORY — PX: POLYPECTOMY: SHX5525

## 2020-06-04 HISTORY — DX: Cardiac murmur, unspecified: R01.1

## 2020-06-04 HISTORY — PX: COLONOSCOPY WITH PROPOFOL: SHX5780

## 2020-06-04 SURGERY — COLONOSCOPY WITH PROPOFOL
Anesthesia: General | Site: Rectum

## 2020-06-04 MED ORDER — LIDOCAINE HCL (CARDIAC) PF 100 MG/5ML IV SOSY
PREFILLED_SYRINGE | INTRAVENOUS | Status: DC | PRN
  Administered 2020-06-04: 30 mg via INTRAVENOUS

## 2020-06-04 MED ORDER — ONDANSETRON HCL 4 MG/2ML IJ SOLN: 4.0000 mg | Freq: Once | INTRAMUSCULAR | Status: DC | PRN

## 2020-06-04 MED ORDER — PROPOFOL 10 MG/ML IV BOLUS
INTRAVENOUS | Status: DC | PRN
  Administered 2020-06-04 (×5): 30 mg via INTRAVENOUS
  Administered 2020-06-04: 100 mg via INTRAVENOUS
  Administered 2020-06-04: 40 mg via INTRAVENOUS
  Administered 2020-06-04 (×2): 30 mg via INTRAVENOUS

## 2020-06-04 MED ORDER — ACETAMINOPHEN 10 MG/ML IV SOLN: 1000.0000 mg | Freq: Once | INTRAVENOUS | Status: DC | PRN

## 2020-06-04 MED ORDER — STERILE WATER FOR IRRIGATION IR SOLN: Status: DC | PRN

## 2020-06-04 MED ORDER — LACTATED RINGERS IV SOLN: INTRAVENOUS | Status: DC

## 2020-06-04 SURGICAL SUPPLY — 8 items
GOWN CVR UNV OPN BCK APRN NK (MISCELLANEOUS) ×2 IMPLANT
GOWN ISOL THUMB LOOP REG UNIV (MISCELLANEOUS) ×6
KIT PRC NS LF DISP ENDO (KITS) ×1 IMPLANT
KIT PROCEDURE OLYMPUS (KITS) ×3
MANIFOLD NEPTUNE II (INSTRUMENTS) ×3 IMPLANT
SNARE COLD EXACTO (MISCELLANEOUS) ×3 IMPLANT
TRAP ETRAP POLY (MISCELLANEOUS) ×3 IMPLANT
WATER STERILE IRR 250ML POUR (IV SOLUTION) ×3 IMPLANT

## 2020-06-04 NOTE — H&P (Signed)
Vonda Antigua, MD [address] [place], [place], [place], [address] [address], [place], [place], [place], [address] Phone: [phone]  Fax: [phone]  Primary Care Physician:  Margo Common, PA   Pre-Procedure History & Physical: HPI:  Miranda Moore is a 59 y.o. female is here for a colonoscopy.   Past Medical History:  Diagnosis Date  . Anxiety   . Depression   . GERD (gastroesophageal reflux disease)   . Heart murmur   . Hypertension   . MRSA infection 2007   left lower leg  . Pre-diabetes   . Sleep apnea    uses CPAP nightly    Past Surgical History:  Procedure Laterality Date  . ABDOMINAL HYSTERECTOMY  2002  . APPENDECTOMY  2013   laparascopic with removal of tubal remnant  . COLONOSCOPY  2015  . KNEE SURGERY  2012  . LAPAROSCOPIC OOPHORECTOMY  2012  . WRIST ARTHROSCOPY WITH DEBRIDEMENT Right 08/30/2018   Procedure: WRIST ARTHROSCOPY WITH DEBRIDEMENT AND SYNOVECTOMY;  Surgeon: Roseanne Kaufman, MD;  Location: [facility];  Service: Orthopedics;  Laterality: Right;    Prior to Admission medications   Medication Sig Start Date End Date Taking? Authorizing Provider  allopurinol (ZYLOPRIM) 100 MG tablet TAKE 1 TABLET BY MOUTH EVERY DAY 05/27/20  Yes Chrismon, Vickki Muff, PA  ALPRAZolam (XANAX) 0.5 MG tablet TAKE 1 TABLET BY MOUTH TWICE A DAY AS NEEDED FOR ANXIETY 04/29/20  Yes Chrismon, Dennis E, PA  amLODIPine-Valsartan-HCTZ 10-160-12.5 MG TABS TAKE 1 TABLET DAILY FOR    HYPERTENSION. DUE FOR      FOLLOW UP LABS 01/26/20  Yes Chrismon, Vickki Muff, PA  aspirin 81 MG tablet Take 81 mg by mouth daily.    Yes [provider]  atorvastatin (LIPITOR) 10 MG tablet Take 1 tablet (10 mg total) by mouth daily. 10/25/19  Yes Trinna Post, PA-C  Calcium Carbonate-Vitamin D 600-200 MG-UNIT TABS  06/11/05  Yes [provider]  Cholecalciferol (VITAMIN D-3) 1000 UNITS CAPS Take by mouth daily.   Yes [provider]    colchicine 0.6 MG tablet For ACUTE gout, take two tablets on day 1 and then one hour later, take a third tablet to total 1.8 mg daily. For prevention, take one 0.6 mg tablet daily with allopurinol. 11/16/19  Yes Trinna Post, PA-C  furosemide (LASIX) 20 MG tablet TAKE 1 TABLET DAILY AS     NEEDED FOR EDEMA. DUE FOR  FOLLOW UP LABS 12/14/19  Yes Chrismon, Vickki Muff, PA  Multiple Vitamins-Minerals (WOMENS MULTIVITAMIN PO) Take by mouth.   Yes [provider]  Omega-3 Fatty Acids (FISH OIL DOUBLE STRENGTH) 1200 MG CAPS Take by mouth. 12/09/10  Yes [provider]  omeprazole (PRILOSEC) 40 MG capsule TAKE 1 CAPSULE DAILY FOR   REFLUX ESOPHAGITIS WITH    GASTRITIS 12/14/19  Yes Chrismon, Simona Huh E, PA  POTASSIUM CITRATE PO Take by mouth daily.   Yes [provider]  sertraline (ZOLOFT) 100 MG tablet TAKE 1 TABLET DAILY 04/29/20  Yes Chrismon, Vickki Muff, PA  amLODipine (NORVASC) 10 MG tablet Take 1 tablet (10 mg total) by mouth daily. Patient not taking: Reported on 05/21/2020 11/22/19   Trinna Post, PA-C  Aspirin Buf,CaCarb-MgCarb-MgO, 81 MG TABS aspirin 81 mg tablet   81 mg by oral route. Patient not taking: Reported on 04/19/2020    [provider]  hydrochlorothiazide (HYDRODIURIL) 12.5 MG tablet Take 1 tablet (12.5 mg total) by mouth daily. Patient  not taking: Reported on 05/21/2020 11/22/19   Trinna Post, PA-C  naproxen sodium (ANAPROX) 550 MG tablet Take 1 tablet (550 mg total) by mouth 2 (two) times daily with a meal. Patient not taking: Reported on 05/21/2020 11/14/19   Chrismon, Vickki Muff, PA  Sod Picosulfate-Mag Ox-Cit Acd (CLENPIQ) 10-3.5-12 MG-GM -GM/160ML SOLN Take 1 bottle at 5 PM followed by five 8 oz cups of water and repeat 5 hours before procedure. 05/07/20   Virgel Manifold, MD  valsartan (DIOVAN) 160 MG tablet Take 1 tablet (160 mg total) by mouth daily. Patient not taking: Reported on 05/21/2020 11/22/19   Trinna Post, PA-C     Allergies as of 05/07/2020 - Review Complete 04/19/2020  Allergen Reaction Noted  . Chlorpheniramine-phenylephrine Hives 05/31/2015  . Other Hives 11/29/2014  . Sulfa antibiotics  05/31/2015  . Sulfur Other (See Comments) 11/29/2014    Family History  Problem Relation Age of Onset  . Cancer Mother        colon  . Heart failure Mother   . Heart disease Father   . Hypertension Father   . Hypertension Brother   . Heart failure Paternal Grandmother   . Dementia Paternal Grandfather   . Breast cancer Paternal Aunt     Social History   Socioeconomic History  . Marital status: Married    Spouse name: Not on file  . Number of children: Not on file  . Years of education: Not on file  . Highest education level: Not on file  Occupational History  . Not on file  Tobacco Use  . Smoking status: Never Smoker  . Smokeless tobacco: Never Used  Substance and Sexual Activity  . Alcohol use: Yes    Alcohol/week: 0.0 standard drinks    Comment: ocassionally  . Drug use: No  . Sexual activity: Not on file  Other Topics Concern  . Not on file  Social History Narrative  . Not on file   Social Determinants of Health   Financial Resource Strain:   . Difficulty of Paying Living Expenses: Not on file  Food Insecurity:   . Worried About Charity fundraiser in the Last Year: Not on file  . Ran Out of Food in the Last Year: Not on file  Transportation Needs:   . Lack of Transportation (Medical): Not on file  . Lack of Transportation (Non-Medical): Not on file  Physical Activity:   . Days of Exercise per Week: Not on file  . Minutes of Exercise per Session: Not on file  Stress:   . Feeling of Stress : Not on file  Social Connections:   . Frequency of Communication with Friends and Family: Not on file  . Frequency of Social Gatherings with Friends and Family: Not on file  . Attends Religious Services: Not on file  . Active Member of Clubs or Organizations: Not on file  . Attends  Archivist Meetings: Not on file  . Marital Status: Not on file  Intimate Partner Violence:   . Fear of Current or Ex-Partner: Not on file  . Emotionally Abused: Not on file  . Physically Abused: Not on file  . Sexually Abused: Not on file    Review of Systems: See HPI, otherwise negative ROS  Physical Exam: BP (!) 156/83   Pulse 77   Temp 98 F (36.7 C) (Temporal)   Ht 5\' 9"  (1.753 m)   Wt 117.9 kg   SpO2 100%   BMI 38.40  kg/m  General:   Alert,  pleasant and cooperative in NAD Head:  Normocephalic and atraumatic. Neck:  Supple; no masses or thyromegaly. Lungs:  Clear throughout to auscultation, normal respiratory effort.    Heart:  +S1, +S2, Regular rate and rhythm, No edema. Abdomen:  Soft, nontender and nondistended. Normal bowel sounds, without guarding, and without rebound.   Neurologic:  Alert and  oriented x4;  grossly normal neurologically.  Impression/Plan: DORTHY MAGNUSSEN is here for a colonoscopy to be performed for  Family history of colon cancer  Risks, benefits, limitations, and alternatives regarding  colonoscopy have been reviewed with the patient.  Questions have been answered.  All parties agreeable.   Virgel Manifold, MD  06/04/2020, 8:09 AM

## 2020-06-04 NOTE — Anesthesia Procedure Notes (Signed)
Date/Time: 06/04/2020 8:16 AM Performed by: Cameron Ali, CRNA Pre-anesthesia Checklist: Patient identified, Emergency Drugs available, Suction available, Timeout performed and Patient being monitored Patient Re-evaluated:Patient Re-evaluated prior to induction Oxygen Delivery Method: Nasal cannula Placement Confirmation: positive ETCO2

## 2020-06-04 NOTE — Transfer of Care (Signed)
Immediate Anesthesia Transfer of Care Note  Patient: Miranda Moore  Procedure(s) Performed: COLONOSCOPY WITH BIOPSY (N/A Rectum) POLYPECTOMY (N/A Rectum)  Patient Location: PACU  Anesthesia Type: General  Level of Consciousness: awake, alert  and patient cooperative  Airway and Oxygen Therapy: Patient Spontanous Breathing and Patient connected to supplemental oxygen  Post-op Assessment: Post-op Vital signs reviewed, Patient's Cardiovascular Status Stable, Respiratory Function Stable, Patent Airway and No signs of Nausea or vomiting  Post-op Vital Signs: Reviewed and stable  Complications: No complications documented.

## 2020-06-04 NOTE — Anesthesia Postprocedure Evaluation (Signed)
Anesthesia Post Note  Patient: Miranda Moore  Procedure(s) Performed: COLONOSCOPY WITH BIOPSY (N/A Rectum) POLYPECTOMY (N/A Rectum)     Patient location during evaluation: PACU Anesthesia Type: General Level of consciousness: awake and alert Pain management: pain level controlled Vital Signs Assessment: post-procedure vital signs reviewed and stable Respiratory status: spontaneous breathing, nonlabored ventilation, respiratory function stable and patient connected to nasal cannula oxygen Cardiovascular status: blood pressure returned to baseline and stable Postop Assessment: no apparent nausea or vomiting Anesthetic complications: no   No complications documented.  Mireya Meditz A  Cleotha Whalin

## 2020-06-04 NOTE — Op Note (Signed)
[facility] Patient Name: Miranda Moore Procedure Date: 06/04/2020 7:27 AM MRN: [id number] Account #: [id number] Date of Birth: Sep 01, 1960 Admit Type: Ambulatory Age: 59 Room: [facility] OR ROOM 01 Gender: Female Note Status: Finalized Procedure:             Colonoscopy Indications:           Screening in patient at increased risk: Family history                         of 1st-degree relative with colorectal cancer Providers:             Jude Linck B. Bonna Gains MD, MD Referring MD:          Vickki Muff. Chrismon, MD (Referring MD) Medicines:             Monitored Anesthesia Care Complications:         No immediate complications. Procedure:             Pre-Anesthesia Assessment:                        - ASA Grade Assessment: II - A patient with mild                         systemic disease.                        - Prior to the procedure, a History and Physical was                         performed, and patient medications, allergies and                         sensitivities were reviewed. The patient's tolerance                         of previous anesthesia was reviewed.                        - The risks and benefits of the procedure and the                         sedation options and risks were discussed with the                         patient. All questions were answered and informed                         consent was obtained.                        - Patient identification and proposed procedure were                         verified prior to the procedure by the physician, the                         nurse, the anesthesiologist, the anesthetist and the  technician. The procedure was verified in the                         procedure room.                        After obtaining informed consent, the colonoscope was                         passed under direct vision. Throughout the procedure,                         the patient's  blood pressure, pulse, and oxygen                         saturations were monitored continuously. The was                         introduced through the anus and advanced to the the                         cecum, identified by appendiceal orifice and ileocecal                         valve. The colonoscopy was performed with ease. The                         patient tolerated the procedure well. The quality of                         the bowel preparation was good. Findings:      The perianal and digital rectal examinations were normal.      A 6 mm polyp was found in the cecum. The polyp was sessile. The polyp       was removed with a cold snare. Resection and retrieval were complete.      The exam was otherwise without abnormality.      The rectum, sigmoid colon, descending colon, transverse colon, ascending       colon and cecum appeared normal.      Non-bleeding internal hemorrhoids were found during retroflexion. Impression:            - One 6 mm polyp in the cecum, removed with a cold                         snare. Resected and retrieved.                        - The examination was otherwise normal.                        - The rectum, sigmoid colon, descending colon,                         transverse colon, ascending colon and cecum are normal.                        - Non-bleeding internal hemorrhoids. Recommendation:        - Discharge patient to home (with escort).                        -  Advance diet as tolerated.                        - Continue present medications.                        - Await pathology results.                        - Repeat colonoscopy in 5 years.                        - The findings and recommendations were discussed with                         the patient.                        - The findings and recommendations were discussed with                         the patient's family.                        - Return to primary care physician as  previously                         scheduled.                        - High fiber diet. Procedure Code(s):     --- Professional ---                        [phone], Colonoscopy, flexible; with removal of                         tumor(s), polyp(s), or other lesion(s) by snare                         technique Diagnosis Code(s):     --- Professional ---                        Z80.0, Family history of malignant neoplasm of                         digestive organs                        K63.5, Polyp of colon CPT copyright 2019 American Medical Association. All rights reserved. The codes documented in this report are preliminary and upon coder review may  be revised to meet current compliance requirements.  Vonda Antigua, MD Margretta Sidle B. Bonna Gains MD, MD 06/04/2020 8:47:22 AM This report has been signed electronically. Number of Addenda: 0 Note Initiated On: 06/04/2020 7:27 AM Scope Withdrawal Time: 0 hours 15 minutes 23 seconds  Total Procedure Duration: 0 hours 19 minutes 29 seconds  Estimated Blood Loss:  Estimated blood loss: none.      [facility]

## 2020-06-05 ENCOUNTER — Encounter: Payer: Self-pay | Admitting: Gastroenterology

## 2020-06-05 LAB — SURGICAL PATHOLOGY

## 2020-06-06 ENCOUNTER — Encounter: Payer: Self-pay | Admitting: Gastroenterology

## 2020-06-12 ENCOUNTER — Other Ambulatory Visit: Payer: Self-pay | Admitting: Physician Assistant

## 2020-06-12 DIAGNOSIS — F418 Other specified anxiety disorders: Secondary | ICD-10-CM

## 2020-06-12 DIAGNOSIS — M109 Gout, unspecified: Secondary | ICD-10-CM

## 2020-06-12 NOTE — Telephone Encounter (Signed)
Requested medications are due for refill today yes  Requested medications are on the active medication list yes  Last refill 2/2  Last visit 10/2019 last to address med/dx  Future visit scheduled Dec 2021  Notes to clinic Not Delegated.

## 2020-07-02 ENCOUNTER — Other Ambulatory Visit: Payer: Self-pay | Admitting: Physician Assistant

## 2020-07-02 DIAGNOSIS — E785 Hyperlipidemia, unspecified: Secondary | ICD-10-CM

## 2020-07-02 DIAGNOSIS — E1169 Type 2 diabetes mellitus with other specified complication: Secondary | ICD-10-CM

## 2020-07-19 ENCOUNTER — Other Ambulatory Visit: Payer: Self-pay

## 2020-07-19 ENCOUNTER — Encounter: Payer: Self-pay | Admitting: Family Medicine

## 2020-07-19 ENCOUNTER — Ambulatory Visit (INDEPENDENT_AMBULATORY_CARE_PROVIDER_SITE_OTHER): Payer: BC Managed Care – PPO | Admitting: Family Medicine

## 2020-07-19 VITALS — BP 142/72 | HR 69 | Temp 98.7°F | Resp 16 | Ht 69.0 in | Wt 266.0 lb

## 2020-07-19 DIAGNOSIS — E7800 Pure hypercholesterolemia, unspecified: Secondary | ICD-10-CM

## 2020-07-19 DIAGNOSIS — E119 Type 2 diabetes mellitus without complications: Secondary | ICD-10-CM

## 2020-07-19 DIAGNOSIS — I10 Essential (primary) hypertension: Secondary | ICD-10-CM

## 2020-07-19 DIAGNOSIS — F419 Anxiety disorder, unspecified: Secondary | ICD-10-CM | POA: Diagnosis not present

## 2020-07-19 DIAGNOSIS — F418 Other specified anxiety disorders: Secondary | ICD-10-CM

## 2020-07-19 LAB — POCT GLYCOSYLATED HEMOGLOBIN (HGB A1C)
Est. average glucose Bld gHb Est-mCnc: 137
Hemoglobin A1C: 6.4 % — AB (ref 4.0–5.6)

## 2020-07-19 MED ORDER — SERTRALINE HCL 100 MG PO TABS: 150.0000 mg | ORAL_TABLET | Freq: Every day | ORAL | 1 refills | Status: DC
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
## 2020-08-09 ENCOUNTER — Other Ambulatory Visit: Payer: Self-pay | Admitting: Family Medicine

## 2020-08-09 DIAGNOSIS — R600 Localized edema: Secondary | ICD-10-CM

## 2020-08-09 DIAGNOSIS — F418 Other specified anxiety disorders: Secondary | ICD-10-CM

## 2020-08-12 NOTE — Telephone Encounter (Signed)
Requested medication (s) are due for refill today: yes  Requested medication (s) are on the active medication list: yes  Last refill:  07/02/2020  Future visit scheduled: no  Notes to clinic:  this refill cannot be delegated   Requested Prescriptions  Pending Prescriptions Disp Refills   ALPRAZolam (XANAX) 0.5 MG tablet [Pharmacy Med Name: ALPRAZOLAM 0.5 MG TABLET] 60 tablet 0    Sig: TAKE 1 TABLET BY MOUTH 2 TIMES DAILY AS NEEDED FOR ANXIETY.      Not Delegated - Psychiatry:  Anxiolytics/Hypnotics Failed - 08/09/2020  1:27 PM      Failed - This refill cannot be delegated      Failed - Urine Drug Screen completed in last 360 days      Passed - Valid encounter within last 6 months    Recent Outpatient Visits           3 weeks ago Type 2 diabetes mellitus without complication, without long-term current use of insulin (HCC)   [facility] Chrismon, Jodell Cipro, PA-C   3 months ago Type 2 diabetes mellitus without complication, without long-term current use of insulin [facility])   [facility] Dewar, Ricki Rodriguez M, [place]   9 months ago Acute gout of left foot, unspecified cause   [facility] Osvaldo Angst M, PA-C   9 months ago Annual physical exam   [facility] [place], Lavella [place], [place]   1 year ago Depression with anxiety   [facility] Chrismon, Jodell Cipro, [place]

## 2020-11-05 ENCOUNTER — Other Ambulatory Visit: Payer: Self-pay | Admitting: Family Medicine

## 2020-11-05 DIAGNOSIS — F418 Other specified anxiety disorders: Secondary | ICD-10-CM

## 2020-11-05 MED ORDER — ALPRAZOLAM 0.5 MG PO TABS: 0.5000 mg | ORAL_TABLET | Freq: Two times a day (BID) | ORAL | 0 refills | Status: DC | PRN

## 2020-11-05 NOTE — Telephone Encounter (Signed)
I will refill the patient has appointment with Miranda Moore in the future.

## 2020-12-17 ENCOUNTER — Other Ambulatory Visit: Payer: Self-pay | Admitting: Family Medicine

## 2020-12-17 DIAGNOSIS — M109 Gout, unspecified: Secondary | ICD-10-CM

## 2021-01-03 ENCOUNTER — Other Ambulatory Visit: Payer: Self-pay | Admitting: Family Medicine

## 2021-01-03 DIAGNOSIS — F418 Other specified anxiety disorders: Secondary | ICD-10-CM

## 2021-01-03 NOTE — Telephone Encounter (Signed)
Requested medications are due for refill today yes  Requested medications are on the active medication list yes  Last refill 3/29  Last visit 07/19/20  Future visit scheduled no, was to return in 4 months, April, no upcoming appt  Notes to clinic Not Delegated.

## 2021-02-11 HISTORY — PX: WRIST SURGERY: SHX841

## 2021-02-20 ENCOUNTER — Other Ambulatory Visit: Payer: Self-pay | Admitting: Family Medicine

## 2021-02-20 DIAGNOSIS — I10 Essential (primary) hypertension: Secondary | ICD-10-CM

## 2021-02-20 DIAGNOSIS — F418 Other specified anxiety disorders: Secondary | ICD-10-CM

## 2021-02-20 DIAGNOSIS — R600 Localized edema: Secondary | ICD-10-CM

## 2021-03-24 ENCOUNTER — Other Ambulatory Visit: Payer: Self-pay | Admitting: Family Medicine

## 2021-03-24 DIAGNOSIS — F418 Other specified anxiety disorders: Secondary | ICD-10-CM

## 2021-03-24 DIAGNOSIS — E785 Hyperlipidemia, unspecified: Secondary | ICD-10-CM

## 2021-03-24 DIAGNOSIS — E1169 Type 2 diabetes mellitus with other specified complication: Secondary | ICD-10-CM

## 2021-03-24 NOTE — Telephone Encounter (Signed)
Requested medications are due for refill today yes  Requested medications are on the active medication list yes  Last refill 5/27  Last visit 07/2020  Future visit scheduled no  Notes to clinic Not Delegated.

## 2021-03-24 NOTE — Telephone Encounter (Signed)
Notes to clinic:  Patient last appt was 07/19/2020  No follow up listed  Review for refills   Requested Prescriptions  Pending Prescriptions Disp Refills   atorvastatin (LIPITOR) 10 MG tablet [Pharmacy Med Name: ATORVASTATIN TAB '10MG'$ ] 90 tablet 2    Sig: TAKE 1 TABLET DAILY     Cardiovascular:  Antilipid - Statins Failed - 03/24/2021 10:04 AM      Failed - HDL in normal range and within 360 days    HDL Cholesterol  Date Value Ref Range Status  06/28/2014 25 (L) 40 - 60 mg/dL Final   HDL  Date Value Ref Range Status  04/19/2020 35 (L) >39 mg/dL Final          Passed - Total Cholesterol in normal range and within 360 days    Cholesterol, Total  Date Value Ref Range Status  04/19/2020 133 100 - 199 mg/dL Final   Cholesterol  Date Value Ref Range Status  06/28/2014 173 0 - 200 mg/dL Final          Passed - LDL in normal range and within 360 days    Ldl Cholesterol, Calc  Date Value Ref Range Status  06/28/2014 126 (H) 0 - 100 mg/dL Final   LDL Chol Calc (NIH)  Date Value Ref Range Status  04/19/2020 79 0 - 99 mg/dL Final          Passed - Triglycerides in normal range and within 360 days    Triglycerides  Date Value Ref Range Status  04/19/2020 103 0 - 149 mg/dL Final  06/28/2014 111 0 - 200 mg/dL Final          Passed - Patient is not pregnant      Passed - Valid encounter within last 12 months    Recent Outpatient Visits           8 months ago Type 2 diabetes mellitus without complication, without long-term current use of insulin ([place])   [facility], Vickki Muff, PA-C   11 months ago Type 2 diabetes mellitus without complication, without long-term current use of insulin [facility])   [facility] [place], [place], PA-C   1 year ago Acute gout of left foot, unspecified cause   [facility] [place], Wendee Beavers, PA-C   1 year ago Annual physical exam   [facility], Adriana M, [place]   1 year  ago Depression with anxiety   [facility], Vickki Muff, PA-C               omeprazole (PRILOSEC) 40 MG capsule [Pharmacy Med Name: OMEPRAZOL RX CAP '40MG'$ ] 90 capsule 3    Sig: TAKE 1 CAPSULE DAILY FOR   REFLUX ESOPHAGITIS WITH    GASTRITIS     Gastroenterology: Proton Pump Inhibitors Passed - 03/24/2021 10:04 AM      Passed - Valid encounter within last 12 months    Recent Outpatient Visits           8 months ago Type 2 diabetes mellitus without complication, without long-term current use of insulin ([place])   [place], Vickki Muff, PA-C   11 months ago Type 2 diabetes mellitus without complication, without long-term current use of insulin [facility])   [place], [place], [place]   1 year ago Acute gout of left foot, unspecified cause   [facility] [place], Wendee Beavers, [place]   1 year ago Annual physical exam  [facility] [place], [place], PA-C   1 year ago Depression with anxiety   [place], [place]

## 2021-04-10 ENCOUNTER — Ambulatory Visit (INDEPENDENT_AMBULATORY_CARE_PROVIDER_SITE_OTHER): Payer: BC Managed Care – PPO | Admitting: Family Medicine

## 2021-04-10 ENCOUNTER — Other Ambulatory Visit: Payer: Self-pay

## 2021-04-10 ENCOUNTER — Encounter: Payer: Self-pay | Admitting: Family Medicine

## 2021-04-10 VITALS — BP 137/76 | HR 79 | Temp 97.7°F | Ht 69.0 in | Wt 253.9 lb

## 2021-04-10 DIAGNOSIS — K2100 Gastro-esophageal reflux disease with esophagitis, without bleeding: Secondary | ICD-10-CM | POA: Diagnosis not present

## 2021-04-10 DIAGNOSIS — F418 Other specified anxiety disorders: Secondary | ICD-10-CM | POA: Diagnosis not present

## 2021-04-10 DIAGNOSIS — R7303 Prediabetes: Secondary | ICD-10-CM

## 2021-04-10 MED ORDER — SERTRALINE HCL 100 MG PO TABS: 150.0000 mg | ORAL_TABLET | Freq: Every day | ORAL | 1 refills | Status: DC

## 2021-04-10 MED ORDER — OMEPRAZOLE 40 MG PO CPDR: DELAYED_RELEASE_CAPSULE | ORAL | 3 refills | Status: DC

## 2021-04-10 NOTE — Progress Notes (Signed)
Established patient visit   Patient: Miranda Moore   DOB: [birth year]   60 y.o. Female  MRN: [id number] Visit Date: 04/10/2021  Today's healthcare provider: Vernie Murders, PA-C   No chief complaint on file.  Subjective  -------------------------------------------------------------------------------------------------------------------- HPI  Patient reports to be feeling well, sleeping well and eating okay. Would like a refill on her  omeprazole and states that her sertraline medication needs to be updated insurance informed her they can only cover a prescription for 3 months worth and she was last sent on a 2 month supply.   Patient Active Problem List   Diagnosis Date Noted   Family history of colon cancer    Polyp of colon    Aftercare 09/13/2018   Tear of triangular fibrocartilage 09/13/2018   Abscess of axilla, right 09/09/2015   Abortion, spontaneous 05/31/2015   Edema leg 05/31/2015   Chest discomfort 05/31/2015   Cough 05/31/2015   Diabetes mellitus ([place]) 05/31/2015   Accumulation of fluid in tissues 05/31/2015   Big thyroid 05/31/2015   Cardiac murmur 05/31/2015   Hot flash, menopausal 05/31/2015   LBP (low back pain) 05/31/2015   Acute thoracic back pain 05/31/2015   Multinodular goiter 05/31/2015   Adiposity 05/31/2015   Abnormal blood sugar 05/31/2015   Cutaneous eruption 05/31/2015   Avitaminosis D 05/31/2015   Eruption cyst 05/31/2015   Anxiety 02/15/2015   Accessory skin tags 11/29/2014   Esophagitis, reflux 04/27/2008   Other general symptoms and signs 10/05/2006   Pure hypercholesterolemia 09/29/2006   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 07/13/2006   Acute stress disorder 02/18/2006   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   Bone/cartilage disorder 07/29/2005   Benign essential HTN 07/10/2005   Past Medical History:  Diagnosis Date   Anxiety    Depression    GERD (gastroesophageal reflux disease)    Heart murmur     Hypertension    MRSA infection 2007   left lower leg   Pre-diabetes    Sleep apnea    uses CPAP nightly   Allergies  Allergen Reactions   Chlorpheniramine-Phenylephrine Hives   Elemental Sulfur Other (See Comments)    Low b/p   Other Hives    actifed   Sulfa Antibiotics       Medications: Outpatient Medications Prior to Visit  Medication Sig   allopurinol (ZYLOPRIM) 100 MG tablet TAKE 1 TABLET BY MOUTH EVERY DAY   ALPRAZolam (XANAX) 0.5 MG tablet TAKE 1 TABLET BY MOUTH TWICE A DAY AS NEEDED FOR ANXIETY   amLODIPine-Valsartan-HCTZ 10-160-12.5 MG TABS TAKE 1 TABLET DAILY FOR    HYPERTENSION. DUE FOR      FOLLOW UP LABS (Patient not taking: Reported on 07/19/2020)   aspirin 81 MG tablet Take 81 mg by mouth daily.    Aspirin Buf,CaCarb-MgCarb-MgO, 81 MG TABS aspirin 81 mg tablet   81 mg by oral route.   atorvastatin (LIPITOR) 10 MG tablet TAKE 1 TABLET DAILY   Calcium Carbonate-Vitamin D 600-200 MG-UNIT TABS    Cholecalciferol (VITAMIN D-3) 1000 UNITS CAPS Take by mouth daily.   colchicine 0.6 MG tablet FOR ACUTE GOUT, TAKE TWO TABLETS ON DAY 1 AND THEN ONE HOUR LATER, TAKE A THIRD TABLET TO TOTAL 1.8 MG DAILY. FOR PREVENTION, TAKE ONE 0.6 MG TABLET DAILY WITH ALLOPURINOL. (Patient not taking: Reported on 07/19/2020)   furosemide (LASIX) 20 MG tablet TAKE 1 TABLET DAILY AS     NEEDED FOR EDEMA.   Multiple Vitamins-Minerals (WOMENS MULTIVITAMIN PO)  Take by mouth.   naproxen sodium (ANAPROX) 550 MG tablet Take 1 tablet (550 mg total) by mouth 2 (two) times daily with a meal.   Omega-3 Fatty Acids (FISH OIL DOUBLE STRENGTH) 1200 MG CAPS Take by mouth.   omeprazole (PRILOSEC) 40 MG capsule TAKE 1 CAPSULE DAILY FOR   REFLUX ESOPHAGITIS WITH    GASTRITIS   POTASSIUM CITRATE PO Take by mouth daily.   sertraline (ZOLOFT) 100 MG tablet Take 1.5 tablets (150 mg total) by mouth daily.   Sod Picosulfate-Mag Ox-Cit Acd (CLENPIQ) 10-3.5-12 MG-GM -GM/160ML SOLN Take 1 bottle at 5 PM followed by  five 8 oz cups of water and repeat 5 hours before procedure.   valsartan (DIOVAN) 160 MG tablet TAKE 1 TABLET DAILY FOR    BLOOD PRESSURE CONTROL   No facility-administered medications prior to visit.    Review of Systems  Last CBC Lab Results  Component Value Date   WBC 7.7 10/18/2019   HGB 11.5 10/18/2019   HCT 37.1 10/18/2019   MCV 78 (L) 10/18/2019   MCH 24.1 (L) 10/18/2019   RDW 13.7 10/18/2019   PLT 241 [id number]   Last metabolic panel Lab Results  Component Value Date   GLUCOSE 102 (H) 10/18/2019   NA 141 10/18/2019   K 3.7 10/18/2019   CL 103 10/18/2019   CO2 25 10/18/2019   BUN 17 10/18/2019   CREATININE 1.00 10/18/2019   GFRNONAA 62 10/18/2019   GFRAA 72 10/18/2019   CALCIUM 9.6 10/18/2019   PROT 6.7 10/18/2019   ALBUMIN 4.1 10/18/2019   LABGLOB 2.6 10/18/2019   AGRATIO 1.6 10/18/2019   BILITOT 0.3 10/18/2019   ALKPHOS 96 10/18/2019   AST 42 (H) 10/18/2019   ALT 42 (H) 10/18/2019   ANIONGAP 10 08/25/2018   Last lipids Lab Results  Component Value Date   CHOL 133 04/19/2020   HDL 35 (L) 04/19/2020   LDLCALC 79 04/19/2020   TRIG 103 04/19/2020   CHOLHDL 3.8 04/19/2020   Last hemoglobin A1c Lab Results  Component Value Date   HGBA1C 6.4 (A) 07/19/2020   Last thyroid functions Lab Results  Component Value Date   TSH 1.450 10/18/2019   Last vitamin D No results found for: 25OHVITD2, 25OHVITD3, VD25OH Last vitamin B12 and Folate No results found for: VITAMINB12, FOLATE     Objective  -------------------------------------------------------------------------------------------------------------------- There were no vitals taken for this visit. BP Readings from Last 3 Encounters:  07/19/20 (!) 142/72  06/04/20 128/72  04/19/20 132/69   Wt Readings from Last 3 Encounters:  07/19/20 266 lb (120.7 kg)  06/04/20 260 lb (117.9 kg)  04/19/20 270 lb 12.8 oz (122.8 kg)    Physical Exam Constitutional:      General: She is not in acute  distress.    Appearance: She is well-developed.  HENT:     Head: Normocephalic and atraumatic.     Right Ear: Hearing normal.     Left Ear: Hearing normal.     Nose: Nose normal.  Eyes:     General: Lids are normal. No scleral icterus.       Right eye: No discharge.        Left eye: No discharge.     Conjunctiva/sclera: Conjunctivae normal.  Pulmonary:     Effort: Pulmonary effort is normal. No respiratory distress.  Musculoskeletal:        General: Normal range of motion.  Skin:    Findings: No lesion or rash.  Neurological:  Mental Status: She is alert and oriented to person, place, and time.  Psychiatric:        Speech: Speech normal.        Behavior: Behavior normal.        Thought Content: Thought content normal.      No results found for any visits on 04/10/21.  Assessment & Plan  ---------------------------------------------------------------------------------------------------------------------- 1. Gastroesophageal reflux disease with esophagitis without hemorrhage No abdominal pain, hematochezia or hematemesis. Heartburn/dyspepsia well controlled with Omeprazole. Recheck labs. Has had evaluation by GI (Dr. Bonna Gains) in the past. - CBC with Differential/Platelet - Comprehensive metabolic panel - omeprazole (PRILOSEC) 40 MG capsule; TAKE 1 CAPSULE DAILY FOR   REFLUX ESOPHAGITIS WITH    GASTRITIS  Dispense: 90 capsule; Refill: 3  2. Depression with anxiety No suicidal ideation. Feels the Sertraline 150 mg continues to control symptoms. Will get follow up labs and refill prescription. - CBC with Differential/Platelet - Comprehensive metabolic panel - sertraline (ZOLOFT) 100 MG tablet; Take 1.5 tablets (150 mg total) by mouth daily.  Dispense: 135 tablet; Refill: 1  3. Prediabetes Hgb A1C was 6.4 in December 2021. Recommend she lose weight and follow a low fat high fiber diet. Recheck labs. - Comprehensive metabolic panel - Hemoglobin A1c   No follow-ups on  file.      I, Jakirah Zaun, PA-C, have reviewed all documentation for this visit. The documentation on 04/10/21 for the exam, diagnosis, procedures, and orders are all accurate and complete.    Vernie Murders, PA-C  [employer] [phone] (phone) [phone] (fax)  [place]

## 2021-04-11 LAB — CBC WITH DIFFERENTIAL/PLATELET
Basophils Absolute: 0 10*3/uL (ref 0.0–0.2)
Basos: 0 %
EOS (ABSOLUTE): 0.3 10*3/uL (ref 0.0–0.4)
Eos: 4 %
Hematocrit: 39.5 % (ref 34.0–46.6)
Hemoglobin: 12.8 g/dL (ref 11.1–15.9)
Immature Grans (Abs): 0.1 10*3/uL (ref 0.0–0.1)
Immature Granulocytes: 1 %
Lymphocytes Absolute: 4.6 10*3/uL — ABNORMAL HIGH (ref 0.7–3.1)
Lymphs: 49 %
MCH: 25 pg — ABNORMAL LOW (ref 26.6–33.0)
MCHC: 32.4 g/dL (ref 31.5–35.7)
MCV: 77 fL — ABNORMAL LOW (ref 79–97)
Monocytes Absolute: 1 10*3/uL — ABNORMAL HIGH (ref 0.1–0.9)
Monocytes: 11 %
Neutrophils Absolute: 3.3 10*3/uL (ref 1.4–7.0)
Neutrophils: 35 %
Platelets: 280 10*3/uL (ref 150–450)
RBC: 5.12 x10E6/uL (ref 3.77–5.28)
RDW: 13.3 % (ref 11.7–15.4)
WBC: 9.3 10*3/uL (ref 3.4–10.8)

## 2021-04-11 LAB — COMPREHENSIVE METABOLIC PANEL
ALT: 34 IU/L — ABNORMAL HIGH (ref 0–32)
AST: 42 IU/L — ABNORMAL HIGH (ref 0–40)
Albumin/Globulin Ratio: 1.6 (ref 1.2–2.2)
Albumin: 4.4 g/dL (ref 3.8–4.9)
Alkaline Phosphatase: 106 IU/L (ref 44–121)
BUN/Creatinine Ratio: 12 (ref 12–28)
BUN: 12 mg/dL (ref 8–27)
Bilirubin Total: 0.3 mg/dL (ref 0.0–1.2)
CO2: 22 mmol/L (ref 20–29)
Calcium: 10.2 mg/dL (ref 8.7–10.3)
Chloride: 101 mmol/L (ref 96–106)
Creatinine, Ser: 0.98 mg/dL (ref 0.57–1.00)
Globulin, Total: 2.8 g/dL (ref 1.5–4.5)
Glucose: 89 mg/dL (ref 65–99)
Potassium: 3.7 mmol/L (ref 3.5–5.2)
Sodium: 143 mmol/L (ref 134–144)
Total Protein: 7.2 g/dL (ref 6.0–8.5)
eGFR: 66 mL/min/{1.73_m2} (ref >59)

## 2021-04-11 LAB — HEMOGLOBIN A1C
Est. average glucose Bld gHb Est-mCnc: 146 mg/dL
Hgb A1c MFr Bld: 6.7 % — ABNORMAL HIGH (ref 4.8–5.6)

## 2021-04-15 ENCOUNTER — Telehealth: Payer: Self-pay

## 2021-04-15 NOTE — Telephone Encounter (Signed)
-----   Message from Margo Common, PA-C sent at 04/14/2021  2:59 PM EDT ----- Normal blood sugar but Hgb A1C (6.7%) indicates glucose has been in the diabetic range on average the past 3-4 months. Slight microcytosis by RBC indices (MCV 77 and MCH 25). Recommend multivitamin with Iron daily. Normal kidney function and liver enzymes only showing some variations from normal. Proceed with present medications and add Metformin 500 mg qd with breakfast #90 & 3RF. Recheck appointment with this office in 3 months to assess progress.

## 2021-04-15 NOTE — Telephone Encounter (Signed)
Acknowledged. Recheck as stated above.

## 2021-05-01 ENCOUNTER — Other Ambulatory Visit: Payer: Self-pay | Admitting: Family Medicine

## 2021-05-01 NOTE — Telephone Encounter (Signed)
Requested medication (s) are due for refill today:   Requested medication (s) are on the active medication list: No  Last refill:  ?  Future visit scheduled: Yes  Notes to clinic:  Medication not on list.    Requested Prescriptions  Pending Prescriptions Disp Refills   hydrochlorothiazide (HYDRODIURIL) 12.5 MG tablet [Pharmacy Med Name: HYDROCHLOROT TAB 12.5MG ] 90 tablet 3    Sig: TAKE 1 TABLET DAILY FOR    BLOOD PRESSURE CONTROL     Cardiovascular: Diuretics - Thiazide Passed - 05/01/2021  9:53 AM      Passed - Ca in normal range and within 360 days    Calcium  Date Value Ref Range Status  04/10/2021 10.2 8.7 - 10.3 mg/dL Final   Calcium, Total  Date Value Ref Range Status  06/28/2014 9.2 8.5 - 10.1 mg/dL Final          Passed - Cr in normal range and within 360 days    Creatinine  Date Value Ref Range Status  06/28/2014 1.22 0.60 - 1.30 mg/dL Final   Creatinine, Ser  Date Value Ref Range Status  04/10/2021 0.98 0.57 - 1.00 mg/dL Final   Creatinine, POC  Date Value Ref Range Status  11/10/2016 NA mg/dL Final          Passed - K in normal range and within 360 days    Potassium  Date Value Ref Range Status  04/10/2021 3.7 3.5 - 5.2 mmol/L Final  06/28/2014 3.4 (L) 3.5 - 5.1 mmol/L Final          Passed - Na in normal range and within 360 days    Sodium  Date Value Ref Range Status  04/10/2021 143 134 - 144 mmol/L Final  06/28/2014 140 136 - 145 mmol/L Final          Passed - Last BP in normal range    BP Readings from Last 1 Encounters:  04/10/21 137/76          Passed - Valid encounter within last 6 months    Recent Outpatient Visits           3 weeks ago Gastroesophageal reflux disease with esophagitis without hemorrhage   [facility] [place], Vickki Muff, PA-C   9 months ago Type 2 diabetes mellitus without complication, without long-term current use of insulin ([place])   [employer], Vickki Muff, PA-C   1 year  ago Type 2 diabetes mellitus without complication, without long-term current use of insulin [facility])   [facility] [place], [place], PA-C   1 year ago Acute gout of left foot, unspecified cause   [facility] [facility], Wendee Beavers, PA-C   1 year ago Annual physical exam   [place], [place]       Future Appointments             In 2 months Fisher, Kirstie Peri, MD [facility]            Signed Prescriptions Disp Refills   amLODipine (NORVASC) 10 MG tablet 90 tablet 0    Sig: TAKE 1 TABLET DAILY FOR    BLOOD PRESSURE CONTROL     Cardiovascular:  Calcium Channel Blockers Passed - 05/01/2021  9:53 AM      Passed - Last BP in normal range    BP Readings from Last 1 Encounters:  04/10/21 137/76          Passed -  Valid encounter within last 6 months    Recent Outpatient Visits           3 weeks ago Gastroesophageal reflux disease with esophagitis without hemorrhage   [place], Vickki Muff, PA-C   9 months ago Type 2 diabetes mellitus without complication, without long-term current use of insulin ([place])   [place], Vickki Muff, PA-C   1 year ago Type 2 diabetes mellitus without complication, without long-term current use of insulin [facility])   [facility] [place], [place], [place]   1 year ago Acute gout of left foot, unspecified cause   [facility] [place], Wendee Beavers, [place]   1 year ago Annual physical exam   [facility] [place], [place]       Future Appointments             In 2 months Fisher, Kirstie Peri, MD [facility], [place]

## 2021-05-06 ENCOUNTER — Other Ambulatory Visit: Payer: Self-pay | Admitting: Family Medicine

## 2021-05-06 DIAGNOSIS — Z1231 Encounter for screening mammogram for malignant neoplasm of breast: Secondary | ICD-10-CM

## 2021-05-19 ENCOUNTER — Other Ambulatory Visit: Payer: Self-pay

## 2021-05-19 ENCOUNTER — Ambulatory Visit
Admission: RE | Admit: 2021-05-19 | Discharge: 2021-05-19 | Disposition: A | Discharge status: Home / Self Care | Payer: BC Managed Care – PPO | Source: Ambulatory Visit | Attending: Family Medicine | Admitting: Family Medicine

## 2021-05-19 DIAGNOSIS — Z1231 Encounter for screening mammogram for malignant neoplasm of breast: Secondary | ICD-10-CM | POA: Diagnosis not present

## 2021-06-04 ENCOUNTER — Other Ambulatory Visit: Payer: Self-pay | Admitting: Family Medicine

## 2021-06-18 ENCOUNTER — Telehealth: Payer: Self-pay | Admitting: Family Medicine

## 2021-06-18 DIAGNOSIS — E1169 Type 2 diabetes mellitus with other specified complication: Secondary | ICD-10-CM

## 2021-06-18 NOTE — Telephone Encounter (Signed)
[facility] faxed refill request for the following medications:   atorvastatin (LIPITOR) 10 MG tablet   Please advise.

## 2021-06-19 MED ORDER — ATORVASTATIN CALCIUM 10 MG PO TABS
10.0000 mg | ORAL_TABLET | Freq: Every day | ORAL | 1 refills | Status: DC
Start: 2021-06-19 — End: 2022-01-19

## 2021-06-19 NOTE — Addendum Note (Signed)
Addended by: Kizzie Furnish on: 06/19/2021 08:38 AM   Modules accepted: Orders

## 2021-07-07 ENCOUNTER — Other Ambulatory Visit: Payer: Self-pay | Admitting: Family Medicine

## 2021-07-07 DIAGNOSIS — I10 Essential (primary) hypertension: Secondary | ICD-10-CM

## 2021-07-07 DIAGNOSIS — F418 Other specified anxiety disorders: Secondary | ICD-10-CM

## 2021-07-08 MED ORDER — VALSARTAN 160 MG PO TABS: ORAL_TABLET | ORAL | 0 refills | Status: DC

## 2021-07-08 NOTE — Addendum Note (Signed)
Addended by: Randal Buba on: 07/08/2021 02:40 PM   Modules accepted: Orders

## 2021-07-08 NOTE — Telephone Encounter (Signed)
Please advise on refill request. Is she supposed to be on Valsartan 160mg  daily and Amlodipine-Valsartan-HCTZ 10-160-12.5MG  daily?

## 2021-07-08 NOTE — Telephone Encounter (Signed)
[facility] fax[facility] refill request for the following medications:  valsartan (DIOVAN) 160 MG tablet   Please advise.

## 2021-07-21 ENCOUNTER — Encounter: Payer: Self-pay | Admitting: Family Medicine

## 2021-07-21 ENCOUNTER — Ambulatory Visit: Payer: BC Managed Care – PPO | Admitting: Family Medicine

## 2021-07-21 ENCOUNTER — Other Ambulatory Visit: Payer: Self-pay

## 2021-07-21 VITALS — BP 151/81 | HR 78 | Temp 98.5°F | Wt 250.0 lb

## 2021-07-21 DIAGNOSIS — E119 Type 2 diabetes mellitus without complications: Secondary | ICD-10-CM

## 2021-07-21 DIAGNOSIS — I10 Essential (primary) hypertension: Secondary | ICD-10-CM | POA: Diagnosis not present

## 2021-07-21 DIAGNOSIS — E7800 Pure hypercholesterolemia, unspecified: Secondary | ICD-10-CM

## 2021-07-21 DIAGNOSIS — F418 Other specified anxiety disorders: Secondary | ICD-10-CM

## 2021-07-21 DIAGNOSIS — N393 Stress incontinence (female) (male): Secondary | ICD-10-CM

## 2021-07-21 LAB — POCT GLYCOSYLATED HEMOGLOBIN (HGB A1C): Hemoglobin A1C: 6.3 % — AB (ref 4.0–5.6)

## 2021-07-21 MED ORDER — ALPRAZOLAM 0.5 MG PO TABS: 0.5000 mg | ORAL_TABLET | Freq: Every day | ORAL | Status: DC

## 2021-07-21 NOTE — Progress Notes (Signed)
Established patient visit   Patient: Miranda Moore   DOB: [birth year]   60 y.o. Female  MRN: [id number] Visit Date: 07/21/2021  Today's healthcare provider: Lelon Huh, MD   No chief complaint on file.  Subjective    HPI  Diabetes Mellitus Type II, follow-up  Lab Results  Component Value Date   HGBA1C 6.7 (H) 04/10/2021   HGBA1C 6.4 (A) 07/19/2020   HGBA1C 7.0 (H) 04/19/2020   Last seen for diabetes 3 months ago.  Management since then includes starting metformin 536m daily. She reports poor compliance with treatment. Pt reports she has not started metformin  Home blood sugar records: fasting range: 130-140's  Episodes of hypoglycemia? No    Current insulin regiment: none Most Recent Eye Exam: within last year by patient report.  --------------------------------------------------------------------------------------------------- Hypertension, follow-up  BP Readings from Last 3 Encounters:  07/21/21 (!) 151/81  04/10/21 137/76  07/19/20 (!) 142/72   Wt Readings from Last 3 Encounters:  07/21/21 250 lb (113.4 kg)  04/10/21 253 lb 14.4 oz (115.2 kg)  07/19/20 266 lb (120.7 kg)     She was last seen for hypertension 1 year ago BP at that visit was 142/72 Management since that visit includes no changes. She reports excellent compliance with treatment. She is not having side effects.  She is exercising. She is adherent to low salt diet.   Outside blood pressures are not being checked at home.  She does not smoke.  Use of agents associated with hypertension: none.   --------------------------------------------------------------------------------------------------- Lipid/Cholesterol, follow-up  Last Lipid Panel: Lab Results  Component Value Date   CHOL 133 04/19/2020   LDLCALC 79 04/19/2020   HDL 35 (L) 04/19/2020   TRIG 103 04/19/2020    She was last seen for this over a year ago. Management since that visit includes no changes.  She reports  excellent compliance with treatment. She is not having side effects.   Symptoms: No appetite changes No foot ulcerations  No chest pain No chest pressure/discomfort  No dyspnea No orthopnea  No fatigue No lower extremity edema  No palpitations No paroxysmal nocturnal dyspnea  No nausea No numbness or tingling of extremity  No polydipsia Yes polyuria  No speech difficulty No syncope    Last metabolic panel Lab Results  Component Value Date   GLUCOSE 89 04/10/2021   NA 143 04/10/2021   K 3.7 04/10/2021   BUN 12 04/10/2021   CREATININE 0.98 04/10/2021   EGFR 66 04/10/2021   GFRNONAA 62 10/18/2019   CALCIUM 10.2 04/10/2021   AST 42 (H) 04/10/2021   ALT 34 (H) 04/10/2021   The 10-year ASCVD risk score (Arnett DK, et al., 2019) is: 21.8%  ---------------------------------------------------------------------------------------------------  She also complains of urinary dribbling and leaking, especially when coughing or sneezing. She is wondering what treatments are available.    Medications: Outpatient Medications Prior to Visit  Medication Sig   allopurinol (ZYLOPRIM) 100 MG tablet TAKE 1 TABLET BY MOUTH EVERY DAY   ALPRAZolam (XANAX) 0.5 MG tablet TAKE 1 TABLET BY MOUTH TWICE A DAY AS NEEDED FOR ANXIETY   amLODipine (NORVASC) 10 MG tablet TAKE 1 TABLET DAILY FOR    BLOOD PRESSURE CONTROL   amLODIPine-Valsartan-HCTZ 10-160-12.5 MG TABS TAKE 1 TABLET DAILY FOR    HYPERTENSION. DUE FOR      FOLLOW UP LABS   aspirin 81 MG tablet Take 81 mg by mouth daily.    Aspirin Buf,CaCarb-MgCarb-MgO, 81 MG TABS aspirin 81 mg  tablet   81 mg by oral route.   atorvastatin (LIPITOR) 10 MG tablet Take 1 tablet (10 mg total) by mouth daily.   Calcium Carbonate-Vitamin D 600-200 MG-UNIT TABS    Cholecalciferol (VITAMIN D-3) 1000 UNITS CAPS Take by mouth daily.   colchicine 0.6 MG tablet FOR ACUTE GOUT, TAKE TWO TABLETS ON DAY 1 AND THEN ONE HOUR LATER, TAKE A THIRD TABLET TO TOTAL 1.8 MG DAILY.  FOR PREVENTION, TAKE ONE 0.6 MG TABLET DAILY WITH ALLOPURINOL. (Patient taking differently: as needed. For ACUTE gout, take two tablets on day 1 and then one hour later, take a third tablet to total 1.8 mg daily. For prevention, take one 0.6 mg tablet daily with allopurinol.)   furosemide (LASIX) 20 MG tablet TAKE 1 TABLET DAILY AS     NEEDED FOR EDEMA.   hydrochlorothiazide (HYDRODIURIL) 12.5 MG tablet TAKE 1 TABLET DAILY FOR    BLOOD PRESSURE CONTROL   Multiple Vitamins-Minerals (WOMENS MULTIVITAMIN PO) Take by mouth.   naproxen sodium (ANAPROX) 550 MG tablet Take 1 tablet (550 mg total) by mouth 2 (two) times daily with a meal.   Omega-3 Fatty Acids (FISH OIL DOUBLE STRENGTH) 1200 MG CAPS Take by mouth.   omeprazole (PRILOSEC) 40 MG capsule TAKE 1 CAPSULE DAILY FOR   REFLUX ESOPHAGITIS WITH    GASTRITIS   POTASSIUM CITRATE PO Take by mouth daily.   sertraline (ZOLOFT) 100 MG tablet Take 1.5 tablets (150 mg total) by mouth daily.   Sod Picosulfate-Mag Ox-Cit Acd (CLENPIQ) 10-3.5-12 MG-GM -GM/160ML SOLN Take 1 bottle at 5 PM followed by five 8 oz cups of water and repeat 5 hours before procedure.   valsartan (DIOVAN) 160 MG tablet TAKE 1 TABLET DAILY FOR    BLOOD PRESSURE CONTROL   No facility-administered medications prior to visit.    Review of Systems  Constitutional:  Negative for appetite change, chills, fatigue and fever.  Respiratory:  Negative for chest tightness and shortness of breath.   Cardiovascular:  Negative for chest pain and palpitations.  Gastrointestinal:  Negative for abdominal pain, nausea and vomiting.  Neurological:  Negative for dizziness and weakness.      Objective    BP (!) 151/81 (BP Location: Left Arm, Patient Position: Sitting, Cuff Size: Large)   Pulse 78   Temp 98.5 F (36.9 C) (Oral)   Wt 250 lb (113.4 kg)   SpO2 100%   BMI 36.92 kg/m    Physical Exam   General: Appearance:    Mildly obese female in no acute distress  Eyes:    PERRL,  conjunctiva/corneas clear, EOM's intact       Lungs:     Clear to auscultation bilaterally, respirations unlabored  Heart:    Normal heart rate. Normal rhythm.     MS:   All extremities are intact.    Neurologic:   Awake, alert, oriented x 3. No apparent focal neurological defect.         Results for orders placed or performed in visit on 07/21/21  POCT glycosylated hemoglobin (Hb A1C)  Result Value Ref Range   Hemoglobin A1C 6.3 (A) 4.0 - 5.6 %    Assessment & Plan     1. Type 2 diabetes mellitus without complication, without long-term current use of insulin (HCC) Well controlled.  Continue current medications.    2. Benign essential HTN BP not to goal today, but usually better controlled. Continue current medications.    3. Pure hypercholesterolemia She is tolerating atorvastatin well  with no adverse effects.    4. Depression with anxiety She states she only taking alprazolam once daily, and would like directions updated on next refill - ALPRAZolam (XANAX) 0.5 MG tablet; Take 1 tablet (0.5 mg total) by mouth daily.  5. Stress incontinence  - Ambulatory referral to Urology    Future Appointments  Date Time Provider [place]  11/05/2021  1:40 PM Drubel, Ria Comment, PA-C [facility]        The entirety of the information documented in the History of Present Illness, Review of Systems and Physical Exam were personally obtained by me. Portions of this information were initially documented by the CMA and reviewed by me for thoroughness and accuracy.     Lelon Huh, MD  [facility] [phone] (phone) [phone] (fax)  [place]

## 2021-09-08 ENCOUNTER — Ambulatory Visit: Payer: BC Managed Care – PPO | Admitting: Urology

## 2021-09-08 ENCOUNTER — Encounter: Payer: Self-pay | Admitting: Urology

## 2021-09-08 ENCOUNTER — Other Ambulatory Visit: Payer: Self-pay

## 2021-09-08 VITALS — BP 175/82 | HR 94 | Ht 68.0 in | Wt 264.0 lb

## 2021-09-08 DIAGNOSIS — N393 Stress incontinence (female) (male): Secondary | ICD-10-CM

## 2021-09-08 DIAGNOSIS — N3946 Mixed incontinence: Secondary | ICD-10-CM | POA: Diagnosis not present

## 2021-09-08 MED ORDER — MIRABEGRON ER 50 MG PO TB24: 50.0000 mg | ORAL_TABLET | Freq: Every day | ORAL | 11 refills | Status: DC

## 2021-09-08 NOTE — Progress Notes (Signed)
09/08/2021 2:58 PM   Miranda Moore 1961/08/06 [id number]  Referring provider: Birdie Sons, MD [address] [place],  [place] [address]  Chief Complaint  Patient presents with   Urinary Incontinence    HPI: I was consulted to assess the patient's urinary incontinence.  She can leak with coughing sneezing and a hard laugh.  She does not leak with bending or lifting.  Sometimes she has urge incontinence.  No bedwetting.  Wears 1-2 pads a day that are damp.  I think the urge component is more severe since she does not cough a lot.  She says when she coughs she loses a small amount she is a Chief Executive Officer at [employer]  She voids every 3 hours but more frequently in the morning after diuretic.  Sometimes he gets up once at night.  She was prescribed Kegel exercises.  Has had a hysterectomy  No history of bladder surgery kidney stones or bladder infections.  Bowel movements normal.  She is a borderline diabetic.   PMH: Past Medical History:  Diagnosis Date   Anxiety    Depression    GERD (gastroesophageal reflux disease)    Heart murmur    Hypertension    MRSA infection 2007   left lower leg   Pre-diabetes    Sleep apnea    uses CPAP nightly    Surgical History: Past Surgical History:  Procedure Laterality Date   ABDOMINAL HYSTERECTOMY  2002   APPENDECTOMY  2013   laparascopic with removal of tubal remnant   COLONOSCOPY  2015   COLONOSCOPY WITH PROPOFOL N/A 06/04/2020   Procedure: COLONOSCOPY WITH BIOPSY;  Surgeon: Virgel Manifold, MD;  Location: [facility];  Service: Endoscopy;  Laterality: N/A;   KNEE SURGERY  2012   LAPAROSCOPIC OOPHORECTOMY  2012   POLYPECTOMY N/A 06/04/2020   Procedure: POLYPECTOMY;  Surgeon: Virgel Manifold, MD;  Location: [facility];  Service: Endoscopy;  Laterality: N/A;   WRIST ARTHROSCOPY WITH DEBRIDEMENT Right 08/30/2018   Procedure: WRIST ARTHROSCOPY WITH DEBRIDEMENT AND SYNOVECTOMY;  Surgeon:  Roseanne Kaufman, MD;  Location: [employer];  Service: Orthopedics;  Laterality: Right;   WRIST SURGERY  02/11/2021    Home Medications:  Allergies as of 09/08/2021       Reactions   Chlorpheniramine-phenylephrine Hives   Elemental Sulfur Other (See Comments)   Low b/p   Other Hives   actifed   Sulfa Antibiotics         Medication List        Accurate as of September 08, 2021  2:58 PM. If you have any questions, ask your nurse or doctor.          allopurinol 100 MG tablet Commonly known as: ZYLOPRIM TAKE 1 TABLET BY MOUTH EVERY DAY   ALPRAZolam 0.5 MG tablet Commonly known as: XANAX Take 1 tablet (0.5 mg total) by mouth daily.   amLODipine 10 MG tablet Commonly known as: NORVASC TAKE 1 TABLET DAILY FOR    BLOOD PRESSURE CONTROL   aspirin 81 MG tablet Take 81 mg by mouth daily.   Aspirin Buf(CaCarb-MgCarb-MgO) 81 MG Tabs aspirin 81 mg tablet   81 mg by oral route.   atorvastatin 10 MG tablet Commonly known as: LIPITOR Take 1 tablet (10 mg total) by mouth daily.   Calcium Carbonate-Vitamin D 600-200 MG-UNIT Tabs   Clenpiq 10-3.5-12 MG-GM -GM/160ML Soln Generic drug: Sod Picosulfate-Mag Ox-Cit Acd Take 1 bottle at 5 PM followed  by five 8 oz cups of water and repeat 5 hours before procedure.   colchicine 0.6 MG tablet FOR ACUTE GOUT, TAKE TWO TABLETS ON DAY 1 AND THEN ONE HOUR LATER, TAKE A THIRD TABLET TO TOTAL 1.8 MG DAILY. FOR PREVENTION, TAKE ONE 0.6 MG TABLET DAILY WITH ALLOPURINOL. What changed: See the new instructions.   Fish Oil Double Strength 1200 MG Caps Take by mouth.   furosemide 20 MG tablet Commonly known as: LASIX TAKE 1 TABLET DAILY AS     NEEDED FOR EDEMA.   hydrochlorothiazide 12.5 MG tablet Commonly known as: HYDRODIURIL TAKE 1 TABLET DAILY FOR    BLOOD PRESSURE CONTROL   naproxen sodium 550 MG tablet Commonly known as: ANAPROX Take 1 tablet (550 mg total) by mouth 2 (two) times daily with a meal.   omeprazole 40  MG capsule Commonly known as: PRILOSEC TAKE 1 CAPSULE DAILY FOR   REFLUX ESOPHAGITIS WITH    GASTRITIS   POTASSIUM CITRATE PO Take by mouth daily.   sertraline 100 MG tablet Commonly known as: ZOLOFT Take 1.5 tablets (150 mg total) by mouth daily.   valsartan 160 MG tablet Commonly known as: DIOVAN TAKE 1 TABLET DAILY FOR    BLOOD PRESSURE CONTROL   Vitamin D-3 25 MCG (1000 UT) Caps Take by mouth daily.   WOMENS MULTIVITAMIN PO Take by mouth.        Allergies:  Allergies  Allergen Reactions   Chlorpheniramine-Phenylephrine Hives   Elemental Sulfur Other (See Comments)    Low b/p   Other Hives    actifed   Sulfa Antibiotics     Family History: Family History  Problem Relation Age of Onset   Cancer Mother        colon   Heart failure Mother    Heart disease Father    Hypertension Father    Hypertension Brother    Heart failure Paternal Grandmother    Dementia Paternal Grandfather    Breast cancer Paternal Aunt     Social History:  reports that she has never smoked. She has never used smokeless tobacco. She reports current alcohol use. She reports that she does not use drugs.  ROS:                                        Physical Exam: BP (!) 175/82    Pulse 94    Ht 5\' 8"  (1.727 m)    Wt 119.7 kg    BMI 40.14 kg/m   Constitutional:  Alert and oriented, No acute distress. HEENT: [place] AT, moist mucus membranes.  Trachea midline, no masses. Cardiovascular: No clubbing, cyanosis, or edema. Respiratory: Normal respiratory effort, no increased work of breathing. GI: Abdomen is soft, nontender, nondistended, no abdominal masses GU: Well supported bladder neck and no prolapse or stress incontinence Skin: No rashes, bruises or suspicious lesions. Lymph: No cervical or inguinal adenopathy. Neurologic: Grossly intact, no focal deficits, moving all 4 extremities. Psychiatric: Normal mood and affect.  Laboratory Data: Lab Results  Component  Value Date   WBC 9.3 04/10/2021   HGB 12.8 04/10/2021   HCT 39.5 04/10/2021   MCV 77 (L) 04/10/2021   PLT 280 04/10/2021    Lab Results  Component Value Date   CREATININE 0.98 04/10/2021    No results found for: PSA  No results found for: TESTOSTERONE  Lab Results  Component Value Date  HGBA1C 6.3 (A) 07/21/2021    Urinalysis    Component Value Date/Time   BILIRUBINUR negative 03/19/2017 1446   PROTEINUR negative 03/19/2017 1446   UROBILINOGEN 0.2 03/19/2017 1446   NITRITE negative 03/19/2017 1446   LEUKOCYTESUR Negative 03/19/2017 1446    Pertinent Imaging: Urine culture sent.  Chart reviewed.  Assessment & Plan: Patient has mild mixed incontinence.  Role of urodynamics and physical therapy discussed.  We decide to try to help her with medical therapy.  If this fails we can order urodynamics.  Reassess in 6 weeks on Myrbetriq 50 mg samples and prescription.  She declined physical therapy.  We will order test in future if needed depending on goals  1. Stress incontinence  - CULTURE, URINE COMPREHENSIVE   No follow-ups on file.  Reece Packer, MD  [facility] [address] [place],  [id number] [phone]

## 2021-09-11 ENCOUNTER — Telehealth: Payer: Self-pay

## 2021-09-11 LAB — CULTURE, URINE COMPREHENSIVE

## 2021-09-11 MED ORDER — CIPROFLOXACIN HCL 250 MG PO TABS: 250.0000 mg | ORAL_TABLET | Freq: Two times a day (BID) | ORAL | 0 refills | Status: AC

## 2021-09-11 NOTE — Telephone Encounter (Signed)
Pt notified of results. Medication sent to pharmacy. Pt verbalized understanding.

## 2021-09-11 NOTE — Telephone Encounter (Signed)
-----   Message from Bjorn Loser, MD sent at 09/11/2021 11:00 AM EST ----- Ciprofloxacin 250 mg twice a day for 7 days ----- Message ----- From: Alvera Novel, CMA Sent: 09/11/2021   8:12 AM EST To: Bjorn Loser, MD   ----- Message ----- From: Interface, [facility] Lab Results In Sent: 09/10/2021   7:37 AM EST To: [facility] Clinical

## 2021-10-06 ENCOUNTER — Other Ambulatory Visit: Payer: Self-pay | Admitting: Family Medicine

## 2021-10-06 DIAGNOSIS — I10 Essential (primary) hypertension: Secondary | ICD-10-CM

## 2021-10-20 ENCOUNTER — Telehealth: Payer: Self-pay | Admitting: Physician Assistant

## 2021-10-20 ENCOUNTER — Ambulatory Visit: Payer: BC Managed Care – PPO | Admitting: Urology

## 2021-10-20 ENCOUNTER — Other Ambulatory Visit: Payer: Self-pay | Admitting: Family Medicine

## 2021-10-20 DIAGNOSIS — F418 Other specified anxiety disorders: Secondary | ICD-10-CM

## 2021-10-20 NOTE — Telephone Encounter (Signed)
[facility] for the following medications: ? ?allopurinol (ZYLOPRIM) 100 MG tablet  ? ? ?Please advise ? ?

## 2021-10-20 NOTE — Telephone Encounter (Signed)
Requested medication (s) are due for refill today: Amount not specified ? ?Requested medication (s) are on the active medication list: yes ? ?Last refill: 07/21/21 ? ?Future visit scheduled yes 11/05/21 ? ?Notes to clinic:   not delegated, please review. ? ?Requested Prescriptions  ?Pending Prescriptions Disp Refills  ? ALPRAZolam (XANAX) 0.5 MG tablet [Pharmacy Med Name: ALPRAZOLAM 0.5 MG TABLET] 60 tablet 1  ?  Sig: TAKE 1 TABLET BY MOUTH TWICE A DAY AS NEEDED FOR ANXIETY  ?  ? Not Delegated - Psychiatry: Anxiolytics/Hypnotics 2 Failed - 10/20/2021 10:30 AM  ?  ?  Failed - This refill cannot be delegated  ?  ?  Failed - Urine Drug Screen completed in last 360 days  ?  ?  Passed - Patient is not pregnant  ?  ?  Passed - Valid encounter within last 6 months  ?  Recent Outpatient Visits   ? ?      ? 3 months ago Type 2 diabetes mellitus without complication, without long-term current use of insulin ([place])  ? [facility] Birdie Sons, MD  ? 6 months ago Gastroesophageal reflux disease with esophagitis without hemorrhage  ? [place], PA-C  ? 1 year ago Type 2 diabetes mellitus without complication, without long-term current use of insulin ([place])  ? [place], PA-C  ? 1 year ago Type 2 diabetes mellitus without complication, without long-term current use of insulin ([place])  ? [place], [place]  ? 1 year ago Acute gout of left foot, unspecified cause  ? [facility] [place], [place], [place]  ? ?  ?  ?Future Appointments   ? ?        ? In 2 weeks [name], PA-C [facility], [facility]  ? ?  ? ?  ?  ?  ? ? ? ? ?

## 2021-10-21 ENCOUNTER — Other Ambulatory Visit: Payer: Self-pay | Admitting: Physician Assistant

## 2021-10-21 DIAGNOSIS — M109 Gout, unspecified: Secondary | ICD-10-CM

## 2021-10-21 MED ORDER — ALLOPURINOL 100 MG PO TABS: 100.0000 mg | ORAL_TABLET | Freq: Every day | ORAL | 1 refills | Status: DC

## 2021-11-03 ENCOUNTER — Telehealth: Payer: Self-pay

## 2021-11-03 NOTE — Telephone Encounter (Signed)
Patient states Edyth Gunnels is too expensive and would like to try something else ?

## 2021-11-04 MED ORDER — OXYBUTYNIN CHLORIDE ER 10 MG PO TB24: 10.0000 mg | ORAL_TABLET | Freq: Every day | ORAL | 11 refills | Status: DC

## 2021-11-04 NOTE — Telephone Encounter (Signed)
As per Dr.MacDiarmid send in Oxy ER 10 mg 30x11 . Patient aware. Sent to pharmacy. ?

## 2021-11-05 ENCOUNTER — Encounter: Payer: BC Managed Care – PPO | Admitting: Physician Assistant

## 2021-11-05 NOTE — Progress Notes (Signed)
? ?I,Jolleen Seman,acting as a scribe for [employer], PA-C.,have documented all relevant documentation on the behalf of [name], PA-C,as directed by  [name], PA-C while in the presence of [name], PA-C. ? ? ?Complete physical exam ? ? ?Patient: Miranda Moore   DOB: 15-May-1961   61 y.o. Female  MRN: [id number] ?Visit Date: 11/06/2021 ? ?Today's healthcare provider: [name], PA-C  ? ?Cc. cpe ? ?Subjective  ?  ?Miranda Moore is a 61 y.o. female who presents today for a complete physical exam.  ?She reports consuming a general diet. The patient does not participate in regular exercise at present. She generally feels well. She reports sleeping well.  ? ?She reports losing her job last month after working for over 30 years, increased anxiety/panic attacks with quick breathing, heart racing, hand shaking.  ? ? ? ?Past Medical History:  ?Diagnosis Date  ? Anxiety   ? Depression   ? GERD (gastroesophageal reflux disease)   ? Heart murmur   ? Hypertension   ? MRSA infection 2007  ? left lower leg  ? Pre-diabetes   ? Sleep apnea   ? uses CPAP nightly  ? ?Past Surgical History:  ?Procedure Laterality Date  ? ABDOMINAL HYSTERECTOMY  2002  ? APPENDECTOMY  2013  ? laparascopic with removal of tubal remnant  ? COLONOSCOPY  2015  ? COLONOSCOPY WITH PROPOFOL N/A 06/04/2020  ? Procedure: COLONOSCOPY WITH BIOPSY;  Surgeon: Virgel Manifold, MD;  Location: [facility];  Service: Endoscopy;  Laterality: N/A;  ? KNEE SURGERY  2012  ? LAPAROSCOPIC OOPHORECTOMY  2012  ? POLYPECTOMY N/A 06/04/2020  ? Procedure: POLYPECTOMY;  Surgeon: Virgel Manifold, MD;  Location: [facility];  Service: Endoscopy;  Laterality: N/A;  ? WRIST ARTHROSCOPY WITH DEBRIDEMENT Right 08/30/2018  ? Procedure: WRIST ARTHROSCOPY WITH DEBRIDEMENT AND SYNOVECTOMY;  Surgeon: Roseanne Kaufman, MD;  Location: [facility];  Service: Orthopedics;  Laterality: Right;  ? WRIST SURGERY  02/11/2021  ? ?Social  History  ? ?Socioeconomic History  ? Marital status: Married  ?  Spouse name: Not on file  ? Number of children: Not on file  ? Years of education: Not on file  ? Highest education level: Not on file  ?Occupational History  ? Not on file  ?Tobacco Use  ? Smoking status: Never  ? Smokeless tobacco: Never  ?Substance and Sexual Activity  ? Alcohol use: Yes  ?  Alcohol/week: 0.0 standard drinks  ?  Comment: ocassionally  ? Drug use: No  ? Sexual activity: Not on file  ?Other Topics Concern  ? Not on file  ?Social History Narrative  ? Not on file  ? ?Social Determinants of Health  ? ?Financial Resource Strain: Not on file  ?Food Insecurity: Not on file  ?Transportation Needs: Not on file  ?Physical Activity: Not on file  ?Stress: Not on file  ?Social Connections: Not on file  ?Intimate Partner Violence: Not on file  ? ?Family Status  ?Relation Name Status  ? Mother  Deceased  ? Father  Alive  ? Brother  Alive  ? PGM  Deceased  ? PGF  Deceased  ? MGM  Deceased  ? MGF  Deceased  ? Sister  Alive  ? Ethlyn Daniels  (Not Specified)  ? ?Family History  ?Problem Relation Age of Onset  ? Cancer Mother   ?     colon  ? Heart failure Mother   ? Heart disease Father   ?  Hypertension Father   ? Hypertension Brother   ? Heart failure Paternal Grandmother   ? Dementia Paternal Grandfather   ? Breast cancer Paternal Aunt   ? ?Allergies  ?Allergen Reactions  ? Chlorpheniramine-Phenylephrine Hives  ? Elemental Sulfur Other (See Comments)  ?  Low b/p  ? Other Hives  ?  actifed  ? Sulfa Antibiotics   ?  ?Patient Care Team: ?Emelia Loron as PCP - General (Physician Assistant) ?Christene Lye, MD (General Surgery) ?Robert Bellow, MD (General Surgery) ?[facility], [place], [place]  ? ?Medications: ?Outpatient Medications Prior to Visit  ?Medication Sig  ? allopurinol (ZYLOPRIM) 100 MG tablet Take 1 tablet (100 mg total) by mouth daily.  ? ALPRAZolam (XANAX) 0.5 MG tablet Take 1 tablet (0.5 mg total) by mouth  daily. (Patient taking differently: Take 0.5 mg by mouth daily. And as needed)  ? amLODipine (NORVASC) 10 MG tablet TAKE 1 TABLET DAILY FOR    BLOOD PRESSURE CONTROL  ? aspirin 81 MG tablet Take 81 mg by mouth daily.   ? Aspirin Buf,CaCarb-MgCarb-MgO, 81 MG TABS aspirin 81 mg tablet ?  81 mg by oral route.  ? atorvastatin (LIPITOR) 10 MG tablet Take 1 tablet (10 mg total) by mouth daily.  ? Cholecalciferol (VITAMIN D-3) 1000 UNITS CAPS Take by mouth daily.  ? colchicine 0.6 MG tablet FOR ACUTE GOUT, TAKE TWO TABLETS ON DAY 1 AND THEN ONE HOUR LATER, TAKE A THIRD TABLET TO TOTAL 1.8 MG DAILY. FOR PREVENTION, TAKE ONE 0.6 MG TABLET DAILY WITH ALLOPURINOL. (Patient taking differently: as needed. For ACUTE gout, take two tablets on day 1 and then one hour later, take a third tablet to total 1.8 mg daily. For prevention, take one 0.6 mg tablet daily with allopurinol.)  ? furosemide (LASIX) 20 MG tablet TAKE 1 TABLET DAILY AS     NEEDED FOR EDEMA.  ? hydrochlorothiazide (HYDRODIURIL) 12.5 MG tablet TAKE 1 TABLET DAILY FOR    BLOOD PRESSURE CONTROL  ? Multiple Vitamins-Minerals (WOMENS MULTIVITAMIN PO) Take by mouth.  ? Omega-3 Fatty Acids (FISH OIL DOUBLE STRENGTH) 1200 MG CAPS Take by mouth.  ? omeprazole (PRILOSEC) 40 MG capsule TAKE 1 CAPSULE DAILY FOR   REFLUX ESOPHAGITIS WITH    GASTRITIS  ? POTASSIUM CITRATE PO Take by mouth daily.  ? sertraline (ZOLOFT) 100 MG tablet Take 1.5 tablets (150 mg total) by mouth daily.  ? valsartan (DIOVAN) 160 MG tablet TAKE 1 TABLET DAILY FOR BLOOD PRESSURE CONTROL  ? Calcium Carbonate-Vitamin D 600-200 MG-UNIT TABS  (Patient not taking: Reported on 11/06/2021)  ? [DISCONTINUED] methocarbamol (ROBAXIN) 500 MG tablet Take 500 mg by mouth every 6 (six) hours as needed. (Patient not taking: Reported on 11/06/2021)  ? [DISCONTINUED] naproxen sodium (ANAPROX) 550 MG tablet Take 1 tablet (550 mg total) by mouth 2 (two) times daily with a meal. (Patient not taking: Reported on 09/08/2021)  ?  [DISCONTINUED] oxybutynin (DITROPAN-XL) 10 MG 24 hr tablet Take 1 tablet (10 mg total) by mouth daily. (Patient not taking: Reported on 11/06/2021)  ? [DISCONTINUED] Sod Picosulfate-Mag Ox-Cit Acd (CLENPIQ) 10-3.5-12 MG-GM -GM/160ML SOLN Take 1 bottle at 5 PM followed by five 8 oz cups of water and repeat 5 hours before procedure. (Patient not taking: Reported on 09/08/2021)  ? ?No facility-administered medications prior to visit.  ? ? ?Review of Systems  ?Constitutional: Negative.  Negative for fatigue and fever.  ?Eyes: Negative.   ?Respiratory:  Negative for cough and shortness of  breath.   ?Cardiovascular: Negative.  Negative for chest pain and leg swelling.  ?Gastrointestinal: Negative.  Negative for abdominal pain.  ?Endocrine: Negative.   ?Genitourinary: Negative.   ?Musculoskeletal: Negative.   ?Skin: Negative.   ?Allergic/Immunologic: Negative.   ?Neurological: Negative.  Negative for dizziness and headaches.  ?Hematological: Negative.   ?Psychiatric/Behavioral:  The patient is nervous/anxious.   ? ? ? Objective  ?  ?Blood pressure 119/72, pulse 75, height '5\' 9"'$  (1.753 m), weight 261 lb 12.8 oz (118.8 kg), SpO2 99 %.  ? ? ?Physical Exam ?Constitutional:   ?   General: She is awake.  ?   Appearance: She is well-developed.  ?HENT:  ?   Head: Normocephalic.  ?   Right Ear: Tympanic membrane normal.  ?   Left Ear: Tympanic membrane normal.  ?Eyes:  ?   Conjunctiva/sclera: Conjunctivae normal.  ?   Pupils: Pupils are equal, round, and reactive to light.  ?Neck:  ?   Thyroid: No thyroid mass or thyromegaly.  ?Cardiovascular:  ?   Rate and Rhythm: Normal rate and regular rhythm.  ?   Heart sounds: Murmur heard.  ?Pulmonary:  ?   Effort: Pulmonary effort is normal.  ?   Breath sounds: Normal breath sounds.  ?Abdominal:  ?   Palpations: Abdomen is soft.  ?   Tenderness: There is no abdominal tenderness.  ?Musculoskeletal:  ?   Right lower leg: No swelling. No edema.  ?   Left lower leg: No swelling. No edema.   ?Lymphadenopathy:  ?   Cervical: No cervical adenopathy.  ?Skin: ?   General: Skin is warm.  ?Neurological:  ?   Mental Status: She is alert and oriented to person, place, and time.  ?Psychiatric:     ?   Attention

## 2021-11-06 ENCOUNTER — Encounter: Payer: Self-pay | Admitting: Physician Assistant

## 2021-11-06 ENCOUNTER — Ambulatory Visit (INDEPENDENT_AMBULATORY_CARE_PROVIDER_SITE_OTHER): Payer: BC Managed Care – PPO | Admitting: Physician Assistant

## 2021-11-06 VITALS — BP 119/72 | HR 75 | Ht 69.0 in | Wt 261.8 lb

## 2021-11-06 DIAGNOSIS — F419 Anxiety disorder, unspecified: Secondary | ICD-10-CM

## 2021-11-06 DIAGNOSIS — E119 Type 2 diabetes mellitus without complications: Secondary | ICD-10-CM | POA: Diagnosis not present

## 2021-11-06 DIAGNOSIS — Z0000 Encounter for general adult medical examination without abnormal findings: Secondary | ICD-10-CM

## 2021-11-06 DIAGNOSIS — E7800 Pure hypercholesterolemia, unspecified: Secondary | ICD-10-CM | POA: Diagnosis not present

## 2021-11-06 DIAGNOSIS — I10 Essential (primary) hypertension: Secondary | ICD-10-CM

## 2021-11-06 NOTE — Assessment & Plan Note (Signed)
Managed with atorvastatin 10 mg ?Will check lipid panel ?

## 2021-11-06 NOTE — Assessment & Plan Note (Addendum)
Currently managing with Zoloft 100 mg takes 1 and a 1/2 pills and Xanax 0.5 mg daily, occasionally twice a day. ?Acute stress reaction to losing job w/ panic attacks ?Discussed therapy, pt agreeable, referral placed ? ? ?

## 2021-11-06 NOTE — Assessment & Plan Note (Signed)
Stable ?Continue valsartan 160 mg, amlodpine 10 mg htctz 12.5 mg and lasix 20 mg ?

## 2021-11-06 NOTE — Assessment & Plan Note (Signed)
Unmedicated, diet/exercise last A1c was 6.3%. Will check A1c ?

## 2021-11-07 LAB — CBC
Hematocrit: 39.9 % (ref 34.0–46.6)
Hemoglobin: 12.2 g/dL (ref 11.1–15.9)
MCH: 24.1 pg — ABNORMAL LOW (ref 26.6–33.0)
MCHC: 30.6 g/dL — ABNORMAL LOW (ref 31.5–35.7)
MCV: 79 fL (ref 79–97)
Platelets: 250 10*3/uL (ref 150–450)
RBC: 5.07 x10E6/uL (ref 3.77–5.28)
RDW: 13.9 % (ref 11.7–15.4)
WBC: 7.3 10*3/uL (ref 3.4–10.8)

## 2021-11-07 LAB — COMPREHENSIVE METABOLIC PANEL
ALT: 37 IU/L — ABNORMAL HIGH (ref 0–32)
AST: 40 IU/L (ref 0–40)
Albumin/Globulin Ratio: 1.6 (ref 1.2–2.2)
Albumin: 4.4 g/dL (ref 3.8–4.9)
Alkaline Phosphatase: 103 IU/L (ref 44–121)
BUN/Creatinine Ratio: 13 (ref 12–28)
BUN: 11 mg/dL (ref 8–27)
Bilirubin Total: 0.5 mg/dL (ref 0.0–1.2)
CO2: 26 mmol/L (ref 20–29)
Calcium: 9.9 mg/dL (ref 8.7–10.3)
Chloride: 101 mmol/L (ref 96–106)
Creatinine, Ser: 0.83 mg/dL (ref 0.57–1.00)
Globulin, Total: 2.7 g/dL (ref 1.5–4.5)
Glucose: 109 mg/dL — ABNORMAL HIGH (ref 70–99)
Potassium: 3.8 mmol/L (ref 3.5–5.2)
Sodium: 142 mmol/L (ref 134–144)
Total Protein: 7.1 g/dL (ref 6.0–8.5)
eGFR: 81 mL/min/{1.73_m2} (ref >59)

## 2021-11-07 LAB — LIPID PANEL
Chol/HDL Ratio: 3.5 ratio (ref 0.0–4.4)
Cholesterol, Total: 144 mg/dL (ref 100–199)
HDL: 41 mg/dL (ref >39)
LDL Chol Calc (NIH): 85 mg/dL (ref 0–99)
Triglycerides: 94 mg/dL (ref 0–149)
VLDL Cholesterol Cal: 18 mg/dL (ref 5–40)

## 2021-11-07 LAB — HEMOGLOBIN A1C
Est. average glucose Bld gHb Est-mCnc: 140 mg/dL
Hgb A1c MFr Bld: 6.5 % — ABNORMAL HIGH (ref 4.8–5.6)

## 2021-11-07 LAB — TSH+FREE T4
Free T4: 1.15 ng/dL (ref 0.82–1.77)
TSH: 1.79 u[IU]/mL (ref 0.450–4.500)

## 2021-11-07 LAB — MICROALBUMIN / CREATININE URINE RATIO
Creatinine, Urine: 337.6 mg/dL
Microalb/Creat Ratio: 4 mg/g creat (ref 0–29)
Microalbumin, Urine: 12.1 ug/mL

## 2021-11-25 ENCOUNTER — Encounter: Payer: Self-pay | Admitting: Physician Assistant

## 2021-12-05 ENCOUNTER — Other Ambulatory Visit: Payer: Self-pay | Admitting: Family Medicine

## 2021-12-05 DIAGNOSIS — F418 Other specified anxiety disorders: Secondary | ICD-10-CM

## 2022-01-16 ENCOUNTER — Other Ambulatory Visit: Payer: Self-pay | Admitting: Family Medicine

## 2022-01-16 DIAGNOSIS — E1169 Type 2 diabetes mellitus with other specified complication: Secondary | ICD-10-CM

## 2022-01-24 ENCOUNTER — Other Ambulatory Visit: Payer: Self-pay | Admitting: Family Medicine

## 2022-02-17 ENCOUNTER — Other Ambulatory Visit: Payer: Self-pay | Admitting: Physician Assistant

## 2022-02-17 DIAGNOSIS — F418 Other specified anxiety disorders: Secondary | ICD-10-CM

## 2022-02-18 ENCOUNTER — Telehealth: Payer: Self-pay | Admitting: Physician Assistant

## 2022-02-18 DIAGNOSIS — F418 Other specified anxiety disorders: Secondary | ICD-10-CM

## 2022-02-18 MED ORDER — SERTRALINE HCL 100 MG PO TABS: 150.0000 mg | ORAL_TABLET | Freq: Every day | ORAL | 1 refills | Status: DC

## 2022-02-18 NOTE — Telephone Encounter (Signed)
[facility] request for the following medications:  sertraline (ZOLOFT) 100 MG tablet    Please advise.

## 2022-02-18 NOTE — Telephone Encounter (Signed)
Requested medication (s) are due for refill today: yes  Requested medication (s) are on the active medication list: yes  Last refill:  12/05/21  Future visit scheduled:yes  Notes to clinic:  Unable to refill per protocol, cannot delegate.      Requested Prescriptions  Pending Prescriptions Disp Refills   ALPRAZolam (XANAX) 0.5 MG tablet [Pharmacy Med Name: ALPRAZOLAM 0.5 MG TABLET] 60 tablet 0    Sig: TAKE 1/2 - 1 TABLET (0.25 - 0.5 MG TOTAL) BY MOUTH TWICE A DAY AS NEEDED FOR ANXIETY     Not Delegated - Psychiatry: Anxiolytics/Hypnotics 2 Failed - 02/17/2022  8:11 PM      Failed - This refill cannot be delegated      Failed - Urine Drug Screen completed in last 360 days      Passed - Patient is not pregnant      Passed - Valid encounter within last 6 months    Recent Outpatient Visits           3 months ago Encounter for health maintenance examination   [facility] Thedore Mins, [place], PA-C   7 months ago Type 2 diabetes mellitus without complication, without long-term current use of insulin [facility])   [facility] Birdie Sons, MD   10 months ago Gastroesophageal reflux disease with esophagitis without hemorrhage   [place], Vickki Muff, PA-C   1 year ago Type 2 diabetes mellitus without complication, without long-term current use of insulin ([place])   [place], Vickki Muff, PA-C   1 year ago Type 2 diabetes mellitus without complication, without long-term current use of insulin [facility])   [place], Wendee [place], [place]       Future Appointments             In 1 month Thedore Mins, Ria Comment, PA-C [facility], [facility]

## 2022-02-18 NOTE — Addendum Note (Signed)
Addended by: Barnie Mort on: 02/18/2022 02:28 PM   Modules accepted: Orders

## 2022-04-07 ENCOUNTER — Other Ambulatory Visit: Payer: Self-pay | Admitting: Family Medicine

## 2022-04-14 ENCOUNTER — Ambulatory Visit: Payer: BC Managed Care – PPO | Admitting: Physician Assistant

## 2022-04-18 ENCOUNTER — Other Ambulatory Visit: Payer: Self-pay | Admitting: Physician Assistant

## 2022-04-18 DIAGNOSIS — F418 Other specified anxiety disorders: Secondary | ICD-10-CM

## 2022-04-20 ENCOUNTER — Other Ambulatory Visit: Payer: Self-pay

## 2022-04-20 ENCOUNTER — Telehealth: Payer: Self-pay | Admitting: Physician Assistant

## 2022-04-20 DIAGNOSIS — R600 Localized edema: Secondary | ICD-10-CM

## 2022-04-20 NOTE — Telephone Encounter (Signed)
[facility] refill request for the following medications:   furosemide (LASIX) 20 MG tablet   Please advise.

## 2022-04-20 NOTE — Telephone Encounter (Signed)
LOV 11/06/21 NOV 04/22/22 LRF 02/20/21 90 x 0

## 2022-04-22 ENCOUNTER — Ambulatory Visit: Payer: BC Managed Care – PPO | Admitting: Physician Assistant

## 2022-04-22 ENCOUNTER — Encounter: Payer: Self-pay | Admitting: Physician Assistant

## 2022-04-22 VITALS — BP 118/66 | HR 69 | Ht 69.0 in | Wt 259.3 lb

## 2022-04-22 DIAGNOSIS — E119 Type 2 diabetes mellitus without complications: Secondary | ICD-10-CM | POA: Diagnosis not present

## 2022-04-22 DIAGNOSIS — F419 Anxiety disorder, unspecified: Secondary | ICD-10-CM

## 2022-04-22 DIAGNOSIS — Z23 Encounter for immunization: Secondary | ICD-10-CM | POA: Diagnosis not present

## 2022-04-22 DIAGNOSIS — R609 Edema, unspecified: Secondary | ICD-10-CM

## 2022-04-22 DIAGNOSIS — I10 Essential (primary) hypertension: Secondary | ICD-10-CM

## 2022-04-22 DIAGNOSIS — Z1231 Encounter for screening mammogram for malignant neoplasm of breast: Secondary | ICD-10-CM

## 2022-04-22 LAB — POCT GLYCOSYLATED HEMOGLOBIN (HGB A1C): Hemoglobin A1C: 6.3 % — AB (ref 4.0–5.6)

## 2022-04-22 NOTE — Assessment & Plan Note (Addendum)
Controlled with lasix 20 mg daily no echo seen on chart or cardiologist visit No DOE or orthopnea Advised to monitor for increased sob  Will hold off on referral / echo until symptoms change

## 2022-04-22 NOTE — Assessment & Plan Note (Signed)
Will continue to monitor A1c q 6 mo -- unmedicated controlled with diet/exercise; fluctuates above and under 6.4% uacr utd, foot exam today, reminded optho  On statin LDL goal < 70  F/u 6 mo

## 2022-04-22 NOTE — Progress Notes (Signed)
I,Sha'taria Tyson,acting as a Education administrator for [employer], PA-C.,have documented all relevant documentation on the behalf of [name], PA-C,as directed by  [name], PA-C while in the presence of [name], PA-C.   Established patient visit   Patient: Miranda Moore   DOB: [birth year]   61 y.o. Female  MRN: [id number] Visit Date: 04/22/2022  Today's healthcare provider: [name], PA-C   Cc. Chronic f/u  Subjective    HPI  Diabetes Mellitus Type II, Follow-up  Lab Results  Component Value Date   HGBA1C 6.5 (H) 11/06/2021   HGBA1C 6.3 (A) 07/21/2021   HGBA1C 6.7 (H) 04/10/2021   Wt Readings from Last 3 Encounters:  04/22/22 259 lb 4.8 oz (117.6 kg)  11/06/21 261 lb 12.8 oz (118.8 kg)  09/08/21 264 lb (119.7 kg)   Last seen for diabetes 6 months ago.  Management since then includes Unmedicated, diet/exercise.  Symptoms: No fatigue No foot ulcerations  No appetite changes No nausea  No paresthesia of the feet  No polydipsia  No polyuria No visual disturbances   No vomiting     Home blood sugar records: does not check regularly  Episodes of hypoglycemia? No    Most Recent Eye Exam: Needs to update Current exercise: walking Current diet habits: well balanced  Pertinent Labs: Lab Results  Component Value Date   CHOL 144 11/06/2021   HDL 41 11/06/2021   LDLCALC 85 11/06/2021   TRIG 94 11/06/2021   CHOLHDL 3.5 11/06/2021   Lab Results  Component Value Date   NA 142 11/06/2021   K 3.8 11/06/2021   CREATININE 0.83 11/06/2021   EGFR 81 11/06/2021   MICROALBUR 20 11/10/2016   LABMICR 12.1 11/06/2021     ---------------------------------------------------------------------------------------------------  Hypertension, follow-up  BP Readings from Last 3 Encounters:  04/22/22 118/66  11/06/21 119/72  09/08/21 (!) 175/82   Wt Readings from Last 3 Encounters:  04/22/22 259 lb 4.8 oz (117.6 kg)  11/06/21 261 lb 12.8 oz (118.8 kg)   09/08/21 264 lb (119.7 kg)     She was last seen for hypertension 6 months ago.  BP at that visit was 119/72. Management since that visit includes Continue valsartan 160 mg, amlodpine 10 mg htctz 12.5 mg and lasix 20 mg.  She reports excellent compliance with treatment. She is not having side effects. She is following a Regular diet. She is exercising. She does not smoke.  Use of agents associated with hypertension: none.   Outside blood pressures are not being checked Symptoms: No chest pain No chest pressure  Yes palpitations No syncope  No dyspnea No orthopnea  No paroxysmal nocturnal dyspnea No lower extremity edema   Pertinent labs Lab Results  Component Value Date   CHOL 144 11/06/2021   HDL 41 11/06/2021   LDLCALC 85 11/06/2021   TRIG 94 11/06/2021   CHOLHDL 3.5 11/06/2021   Lab Results  Component Value Date   NA 142 11/06/2021   K 3.8 11/06/2021   CREATININE 0.83 11/06/2021   EGFR 81 11/06/2021   GLUCOSE 109 (H) 11/06/2021   TSH 1.790 11/06/2021     The 10-year ASCVD risk score (Arnett DK, et al., 2019) is: 11.5%  ---------------------------------------------------------------------------------------------------  Anxiety, Follow-up  She was last seen for anxiety 6 months ago. Changes made at last visit include continue Zoloft 100 mg takes 1 and a 1/2 pills and Xanax 0.5 mg daily, occasionally twice a day.   She reports excellent compliance with treatment. She  reports excellent tolerance of treatment. She is not having side effects.   She feels her anxiety is moderate and  depends on the day as far as if she feels it has improved or stayed the same  since last visit.  Symptoms: No chest pain No difficulty concentrating  Yes dizziness No fatigue  No feelings of losing control No insomnia  No irritable No palpitations  Yes panic attacks No racing thoughts  No shortness of breath No sweating  No tremors/shakes    GAD-7 Results    04/22/2022     2:18 PM 11/06/2021    8:54 AM 07/19/2020    3:35 PM  GAD-7 Generalized Anxiety Disorder Screening Tool  1. Feeling Nervous, Anxious, or on Edge '1 1 1  ' 2. Not Being Able to Stop or Control Worrying '1 2 1  ' 3. Worrying Too Much About Different Things '1 2 1  ' 4. Trouble Relaxing 1 0 0  5. Being So Restless it's Hard To Sit Still 0 0 0  6. Becoming Easily Annoyed or Irritable '1 1 1  ' 7. Feeling Afraid As If Something Awful Might Happen 0 1 1  Total GAD-7 Score '5 7 5  ' Difficulty At Work, Home, or Getting  Along With Others? Not difficult at all Not difficult at all Somewhat difficult    PHQ-9 Scores    11/06/2021    8:54 AM 07/21/2021    3:44 PM 04/10/2021    2:59 PM  PHQ9 SCORE ONLY  PHQ-9 Total Score '4 4 5    ' ---------------------------------------------------------------------------------------------------   Medications: Outpatient Medications Prior to Visit  Medication Sig   allopurinol (ZYLOPRIM) 100 MG tablet Take 1 tablet (100 mg total) by mouth daily.   ALPRAZolam (XANAX) 0.5 MG tablet TAKE 1/2 - 1 TABLET (0.25 - 0.5 MG TOTAL) BY MOUTH TWICE A DAY AS NEEDED FOR ANXIETY   amLODipine (NORVASC) 10 MG tablet TAKE 1 TABLET DAILY FOR    BLOOD PRESSURE CONTROL   aspirin 81 MG tablet Take 81 mg by mouth daily.    atorvastatin (LIPITOR) 10 MG tablet TAKE 1 TABLET DAILY   Calcium Carbonate-Vitamin D 600-200 MG-UNIT TABS    Cholecalciferol (VITAMIN D-3) 1000 UNITS CAPS Take by mouth daily.   colchicine 0.6 MG tablet FOR ACUTE GOUT, TAKE TWO TABLETS ON DAY 1 AND THEN ONE HOUR LATER, TAKE A THIRD TABLET TO TOTAL 1.8 MG DAILY. FOR PREVENTION, TAKE ONE 0.6 MG TABLET DAILY WITH ALLOPURINOL. (Patient taking differently: as needed. For ACUTE gout, take two tablets on day 1 and then one hour later, take a third tablet to total 1.8 mg daily. For prevention, take one 0.6 mg tablet daily with allopurinol.)   furosemide (LASIX) 20 MG tablet TAKE 1 TABLET DAILY AS     NEEDED FOR EDEMA.    hydrochlorothiazide (HYDRODIURIL) 12.5 MG tablet TAKE 1 TABLET DAILY FOR    BLOOD PRESSURE CONTROL   Multiple Vitamins-Minerals (WOMENS MULTIVITAMIN PO) Take by mouth.   Omega-3 Fatty Acids (FISH OIL DOUBLE STRENGTH) 1200 MG CAPS Take by mouth.   omeprazole (PRILOSEC) 40 MG capsule TAKE 1 CAPSULE DAILY FOR   REFLUX ESOPHAGITIS WITH    GASTRITIS   oxybutynin (DITROPAN-XL) 10 MG 24 hr tablet Take 10 mg by mouth daily.   POTASSIUM CITRATE PO Take by mouth daily.   sertraline (ZOLOFT) 100 MG tablet Take 1.5 tablets (150 mg total) by mouth daily.   valsartan (DIOVAN) 160 MG tablet TAKE 1 TABLET DAILY FOR BLOOD PRESSURE CONTROL  Aspirin Buf,CaCarb-MgCarb-MgO, 81 MG TABS aspirin 81 mg tablet   81 mg by oral route. (Patient not taking: Reported on 04/22/2022)   No facility-administered medications prior to visit.    Review of Systems  Constitutional:  Negative for fatigue and fever.  Respiratory:  Negative for cough and shortness of breath.   Cardiovascular:  Negative for chest pain and leg swelling.  Gastrointestinal:  Negative for abdominal pain.  Neurological:  Negative for dizziness and headaches.     Objective    Blood pressure 118/66, pulse 69, height '5\' 9"'  (1.753 m), weight 259 lb 4.8 oz (117.6 kg), SpO2 100 %.   Physical Exam Vitals reviewed.  Constitutional:      General: She is awake.     Appearance: She is well-developed. She is not ill-appearing.  HENT:     Head: Normocephalic.  Eyes:     Conjunctiva/sclera: Conjunctivae normal.  Cardiovascular:     Rate and Rhythm: Normal rate and regular rhythm.     Pulses:          Dorsalis pedis pulses are 3+ on the right side and 3+ on the left side.       Posterior tibial pulses are 3+ on the right side and 3+ on the left side.     Heart sounds: Normal heart sounds.  Pulmonary:     Effort: Pulmonary effort is normal. No respiratory distress.     Breath sounds: Normal breath sounds.  Feet:     Right foot:     Protective  Sensation: 4 sites tested.  4 sites sensed.     Skin integrity: Skin integrity normal.     Toenail Condition: Right toenails are normal.     Left foot:     Protective Sensation: 4 sites tested.  4 sites sensed.     Skin integrity: Skin integrity normal.     Toenail Condition: Left toenails are normal.  Skin:    General: Skin is warm.  Neurological:     General: No focal deficit present.     Mental Status: She is alert and oriented to person, place, and time.  Psychiatric:        Attention and Perception: Attention normal.        Mood and Affect: Mood normal.        Speech: Speech normal.        Behavior: Behavior normal. Behavior is cooperative.     No results found for any visits on 04/22/22.  Assessment & Plan     Problem List Items Addressed This Visit       Cardiovascular and Mediastinum   Benign essential HTN - Primary    Managed with valsartan 160 mg, amlodipine 10 mg, hctz 12.5 mg  Well controlled continue current medications Ordered cmp      Relevant Orders   Comprehensive Metabolic Panel (CMET)     Endocrine   Diabetes mellitus ([place])    Will continue to monitor A1c q 6 mo -- unmedicated controlled with diet/exercise; fluctuates above and under 6.4% uacr utd, foot exam today, reminded optho  On statin LDL goal < 70  F/u 6 mo         Relevant Orders   POCT HgB A1C   Comprehensive Metabolic Panel (CMET)     Other   Anxiety    Managed with zoloft 150 mg and xanax 0.5 mg once daily Advised to try to cut down to 1/2 pill of xanax daily  Discussed therapy again F/u  6 mo       Peripheral edema    Controlled with lasix 20 mg daily no echo seen on chart or cardiologist visit No DOE or orthopnea Advised to monitor for increased sob  Will hold off on referral / echo until symptoms change      Relevant Orders   Comprehensive Metabolic Panel (CMET)   Other Visit Diagnoses     Need for immunization against influenza       Relevant Orders   Flu  Vaccine QUAD 6+ mos PF IM (Fluarix Quad PF)   Breast cancer screening by mammogram       Relevant Orders   MM 3D SCREEN BREAST BILATERAL       Return in about 6 months (around 10/21/2022) for CPE.      I, [name], PA-C have reviewed all documentation for this visit. The documentation on  04/22/2022 for the exam, diagnosis, procedures, and orders are all accurate and complete.  [name], PA-C [facility] [address] [place], [place], [address] Office: [phone] Fax: [place]

## 2022-04-22 NOTE — Assessment & Plan Note (Addendum)
Managed with valsartan 160 mg, amlodipine 10 mg, hctz 12.5 mg  Well controlled continue current medications Ordered cmp

## 2022-04-22 NOTE — Assessment & Plan Note (Addendum)
Managed with zoloft 150 mg and xanax 0.5 mg once daily Advised to try to cut down to 1/2 pill of xanax daily  Discussed therapy again F/u 6 mo

## 2022-04-23 ENCOUNTER — Other Ambulatory Visit: Payer: Self-pay | Admitting: Physician Assistant

## 2022-04-23 DIAGNOSIS — R748 Abnormal levels of other serum enzymes: Secondary | ICD-10-CM

## 2022-04-23 LAB — COMPREHENSIVE METABOLIC PANEL
ALT: 51 IU/L — ABNORMAL HIGH (ref 0–32)
AST: 55 IU/L — ABNORMAL HIGH (ref 0–40)
Albumin/Globulin Ratio: 1.4 (ref 1.2–2.2)
Albumin: 4.3 g/dL (ref 3.9–4.9)
Alkaline Phosphatase: 89 IU/L (ref 44–121)
BUN/Creatinine Ratio: 12 (ref 12–28)
BUN: 12 mg/dL (ref 8–27)
Bilirubin Total: 0.5 mg/dL (ref 0.0–1.2)
CO2: 27 mmol/L (ref 20–29)
Calcium: 10.4 mg/dL — ABNORMAL HIGH (ref 8.7–10.3)
Chloride: 101 mmol/L (ref 96–106)
Creatinine, Ser: 0.99 mg/dL (ref 0.57–1.00)
Globulin, Total: 3 g/dL (ref 1.5–4.5)
Glucose: 78 mg/dL (ref 70–99)
Potassium: 3.8 mmol/L (ref 3.5–5.2)
Sodium: 143 mmol/L (ref 134–144)
Total Protein: 7.3 g/dL (ref 6.0–8.5)
eGFR: 65 mL/min/{1.73_m2} (ref >59)

## 2022-04-23 MED ORDER — FUROSEMIDE 20 MG PO TABS: 20.0000 mg | ORAL_TABLET | Freq: Every day | ORAL | 1 refills | Status: DC | PRN

## 2022-05-06 ENCOUNTER — Other Ambulatory Visit: Payer: Self-pay | Admitting: Family Medicine

## 2022-05-06 NOTE — Telephone Encounter (Signed)
Requested Prescriptions  Pending Prescriptions Disp Refills  . hydrochlorothiazide (HYDRODIURIL) 12.5 MG tablet [Pharmacy Med Name: HYDROCHLOROT TAB 12.'5MG'$ ] 90 tablet 1    Sig: TAKE 1 TABLET DAILY FOR    BLOOD PRESSURE CONTROL     Cardiovascular: Diuretics - Thiazide Passed - 05/06/2022  9:29 AM      Passed - Cr in normal range and within 180 days    Creatinine  Date Value Ref Range Status  06/28/2014 1.22 0.60 - 1.30 mg/dL Final   Creatinine, Ser  Date Value Ref Range Status  04/22/2022 0.99 0.57 - 1.00 mg/dL Final   Creatinine, POC  Date Value Ref Range Status  11/10/2016 NA mg/dL Final         Passed - K in normal range and within 180 days    Potassium  Date Value Ref Range Status  04/22/2022 3.8 3.5 - 5.2 mmol/L Final  06/28/2014 3.4 (L) 3.5 - 5.1 mmol/L Final         Passed - Na in normal range and within 180 days    Sodium  Date Value Ref Range Status  04/22/2022 143 134 - 144 mmol/L Final  06/28/2014 140 136 - 145 mmol/L Final         Passed - Last BP in normal range    BP Readings from Last 1 Encounters:  04/22/22 118/66         Passed - Valid encounter within last 6 months    Recent Outpatient Visits          2 weeks ago Benign essential HTN   [facility] C[place], [place], PA-C   6 months ago Encounter for health maintenance examination   [employer], [place], PA-C   9 months ago Type 2 diabetes mellitus without complication, without long-term current use of insulin ([place])   [facility] Birdie Sons, MD   1 year ago Gastroesophageal reflux disease with esophagitis without hemorrhage   [place], Vickki Muff, PA-C   1 year ago Type 2 diabetes mellitus without complication, without long-term current use of insulin [facility])   [place], Vickki Muff, PA-C      Future Appointments            In 6 months Drubel, Ria Comment, PA-C [employer], [place]

## 2022-05-17 ENCOUNTER — Other Ambulatory Visit: Payer: Self-pay | Admitting: Physician Assistant

## 2022-05-17 DIAGNOSIS — M109 Gout, unspecified: Secondary | ICD-10-CM

## 2022-05-19 ENCOUNTER — Telehealth: Payer: Self-pay | Admitting: Physician Assistant

## 2022-05-19 NOTE — Telephone Encounter (Signed)
[place] confirmed forms were received.

## 2022-05-19 NOTE — Telephone Encounter (Signed)
  [facility] services faxed over twice a medical release form within 6 months. Patient would like to know the status of the request.   Patient contact person at [facility] is [employer] phone # [phone] fax # [phone].   LVM on Cindi Migilvray VM requesting her to re fax medical record request to [facility] fax # [phone]. Please note when received.

## 2022-06-02 ENCOUNTER — Other Ambulatory Visit: Payer: Self-pay | Admitting: Physician Assistant

## 2022-06-02 DIAGNOSIS — F418 Other specified anxiety disorders: Secondary | ICD-10-CM

## 2022-06-20 ENCOUNTER — Other Ambulatory Visit: Payer: Self-pay | Admitting: Physician Assistant

## 2022-06-20 DIAGNOSIS — F418 Other specified anxiety disorders: Secondary | ICD-10-CM

## 2022-06-25 ENCOUNTER — Ambulatory Visit: Payer: Self-pay

## 2022-06-25 NOTE — Telephone Encounter (Signed)
Message from [employer] sent at 06/25/2022 12:46 PM EST  Summary: dizziness / rx concern   The patient has shared that they have began to experience dizziness that is concerning them  The patient shares that their dizziness last occurred at 12:15 PM today 06/25/22  The patient has been recently prescribed oxybutynin (DITROPAN-XL) 10 MG 24 hr tablet [[id number]] and was told dizziness was a potential side effect  Please contact the patient further when possible        Third attempt to reach pt. LM on VM to call back. Routing to office.

## 2022-06-25 NOTE — Telephone Encounter (Signed)
Message from [employer] sent at 06/25/2022 12:46 PM EST  Summary: dizziness / rx concern   The patient has shared that they have began to experience dizziness that is concerning them  The patient shares that their dizziness last occurred at 12:15 PM today 06/25/22  The patient has been recently prescribed oxybutynin (DITROPAN-XL) 10 MG 24 hr tablet [[id number]] and was told dizziness was a potential side effect  Please contact the patient further when possible        Called pt and LM on VM to call back.

## 2022-06-25 NOTE — Telephone Encounter (Signed)
Patient advised. She reports she has been taking medication for a few months now. She will call urology to let them know. She will call back to schedule appt if dizziness continues.

## 2022-06-25 NOTE — Telephone Encounter (Signed)
Summary: dizziness / rx concern   The patient has shared that they have began to experience dizziness that is concerning them  The patient shares that their dizziness last occurred at 12:15 PM today 06/25/22  The patient has been recently prescribed oxybutynin (DITROPAN-XL) 10 MG 24 hr tablet [[id number]] and was told dizziness was a potential side effect  Please contact the patient further when possible    Called pt and unable to LM, Vm not set up.

## 2022-06-26 NOTE — Telephone Encounter (Signed)
Noted ty

## 2022-07-01 ENCOUNTER — Other Ambulatory Visit: Payer: Self-pay | Admitting: Physician Assistant

## 2022-07-01 DIAGNOSIS — K2100 Gastro-esophageal reflux disease with esophagitis, without bleeding: Secondary | ICD-10-CM

## 2022-07-01 MED ORDER — OMEPRAZOLE 40 MG PO CPDR: DELAYED_RELEASE_CAPSULE | ORAL | 3 refills | Status: DC

## 2022-08-18 ENCOUNTER — Other Ambulatory Visit: Payer: Self-pay | Admitting: Physician Assistant

## 2022-08-18 DIAGNOSIS — E1169 Type 2 diabetes mellitus with other specified complication: Secondary | ICD-10-CM

## 2022-09-21 ENCOUNTER — Other Ambulatory Visit: Payer: Self-pay | Admitting: Physician Assistant

## 2022-09-21 DIAGNOSIS — F418 Other specified anxiety disorders: Secondary | ICD-10-CM

## 2022-09-30 ENCOUNTER — Ambulatory Visit
Admission: RE | Admit: 2022-09-30 | Discharge: 2022-09-30 | Disposition: A | Discharge status: Home / Self Care | Payer: BC Managed Care – PPO | Source: Ambulatory Visit | Attending: Physician Assistant | Admitting: Physician Assistant

## 2022-09-30 DIAGNOSIS — Z1231 Encounter for screening mammogram for malignant neoplasm of breast: Secondary | ICD-10-CM | POA: Diagnosis not present

## 2022-10-05 ENCOUNTER — Other Ambulatory Visit: Payer: Self-pay | Admitting: Physician Assistant

## 2022-10-05 DIAGNOSIS — R928 Other abnormal and inconclusive findings on diagnostic imaging of breast: Secondary | ICD-10-CM

## 2022-10-05 DIAGNOSIS — R921 Mammographic calcification found on diagnostic imaging of breast: Secondary | ICD-10-CM

## 2022-10-07 ENCOUNTER — Ambulatory Visit
Admission: RE | Admit: 2022-10-07 | Discharge: 2022-10-07 | Disposition: A | Discharge status: Home / Self Care | Payer: BC Managed Care – PPO | Source: Ambulatory Visit | Attending: Physician Assistant | Admitting: Physician Assistant

## 2022-10-07 DIAGNOSIS — R928 Other abnormal and inconclusive findings on diagnostic imaging of breast: Secondary | ICD-10-CM | POA: Diagnosis present

## 2022-10-07 DIAGNOSIS — R921 Mammographic calcification found on diagnostic imaging of breast: Secondary | ICD-10-CM | POA: Diagnosis present

## 2022-10-08 ENCOUNTER — Other Ambulatory Visit: Payer: Self-pay | Admitting: Physician Assistant

## 2022-10-08 DIAGNOSIS — R921 Mammographic calcification found on diagnostic imaging of breast: Secondary | ICD-10-CM

## 2022-10-08 DIAGNOSIS — R928 Other abnormal and inconclusive findings on diagnostic imaging of breast: Secondary | ICD-10-CM

## 2022-10-14 ENCOUNTER — Ambulatory Visit
Admission: RE | Admit: 2022-10-14 | Discharge: 2022-10-14 | Disposition: A | Discharge status: Home / Self Care | Payer: BC Managed Care – PPO | Source: Ambulatory Visit | Attending: Physician Assistant | Admitting: Physician Assistant

## 2022-10-14 DIAGNOSIS — R928 Other abnormal and inconclusive findings on diagnostic imaging of breast: Secondary | ICD-10-CM | POA: Diagnosis present

## 2022-10-14 DIAGNOSIS — R921 Mammographic calcification found on diagnostic imaging of breast: Secondary | ICD-10-CM | POA: Insufficient documentation

## 2022-10-14 HISTORY — PX: BREAST BIOPSY: SHX20

## 2022-10-14 MED ORDER — LIDOCAINE HCL (PF) 1 % IJ SOLN
15.0000 mL | Freq: Once | INTRAMUSCULAR | Status: AC
  Administered 2022-10-14: 15 mL
  Filled 2022-10-14: qty 15

## 2022-10-14 MED ORDER — LIDOCAINE-EPINEPHRINE 1 %-1:100000 IJ SOLN
10.0000 mL | Freq: Once | INTRAMUSCULAR | Status: AC
  Administered 2022-10-14: 10 mL
  Filled 2022-10-14: qty 10

## 2022-10-14 MED ORDER — LIDOCAINE HCL (PF) 1 % IJ SOLN
5.0000 mL | Freq: Once | INTRAMUSCULAR | Status: AC
  Administered 2022-10-14: 5 mL
  Filled 2022-10-14: qty 5

## 2022-10-15 LAB — SURGICAL PATHOLOGY

## 2022-10-27 ENCOUNTER — Encounter: Payer: Self-pay | Admitting: Physician Assistant

## 2022-10-27 ENCOUNTER — Ambulatory Visit: Payer: BC Managed Care – PPO | Admitting: Physician Assistant

## 2022-10-27 VITALS — BP 144/75 | HR 68 | Wt 252.0 lb

## 2022-10-27 DIAGNOSIS — E1169 Type 2 diabetes mellitus with other specified complication: Secondary | ICD-10-CM | POA: Diagnosis not present

## 2022-10-27 DIAGNOSIS — E785 Hyperlipidemia, unspecified: Secondary | ICD-10-CM

## 2022-10-27 DIAGNOSIS — E119 Type 2 diabetes mellitus without complications: Secondary | ICD-10-CM | POA: Diagnosis not present

## 2022-10-27 DIAGNOSIS — N6489 Other specified disorders of breast: Secondary | ICD-10-CM

## 2022-10-27 DIAGNOSIS — R748 Abnormal levels of other serum enzymes: Secondary | ICD-10-CM

## 2022-10-27 NOTE — Progress Notes (Signed)
I,Sha'taria Tyson,acting as a Education administrator for [employer], PA-C.,have documented all relevant documentation on the behalf of [name], PA-C,as directed by  [name], PA-C while in the presence of [name], PA-C.   Established patient visit   Patient: Miranda Moore   DOB: 1961-07-28   62 y.o. Female  MRN: [id number] Visit Date: 10/27/2022  Today's healthcare provider: [name], PA-C   Cc. Right breast biopsy issue  Subjective    HPI  Pt is s/p stereotactic bx x2 of the right breast 10/14/22 she reports some remaining pain/lump/hardness of one of the sites. Denies discharge, redness, fever.   Medications: Outpatient Medications Prior to Visit  Medication Sig   allopurinol (ZYLOPRIM) 100 MG tablet TAKE 1 TABLET BY MOUTH EVERY DAY   ALPRAZolam (XANAX) 0.5 MG tablet TAKE 1/2 - 1 TABLET (0.25 - 0.5 MG TOTAL) BY MOUTH TWICE A DAY AS NEEDED FOR ANXIETY   amLODipine (NORVASC) 10 MG tablet TAKE 1 TABLET DAILY FOR    BLOOD PRESSURE CONTROL   aspirin 81 MG tablet Take 81 mg by mouth daily.    atorvastatin (LIPITOR) 10 MG tablet TAKE 1 TABLET DAILY   Calcium Carbonate-Vitamin D 600-200 MG-UNIT TABS    Cholecalciferol (VITAMIN D-3) 1000 UNITS CAPS Take by mouth daily.   colchicine 0.6 MG tablet FOR ACUTE GOUT, TAKE TWO TABLETS ON DAY 1 AND THEN ONE HOUR LATER, TAKE A THIRD TABLET TO TOTAL 1.8 MG DAILY. FOR PREVENTION, TAKE ONE 0.6 MG TABLET DAILY WITH ALLOPURINOL. (Patient taking differently: as needed. For ACUTE gout, take two tablets on day 1 and then one hour later, take a third tablet to total 1.8 mg daily. For prevention, take one 0.6 mg tablet daily with allopurinol.)   furosemide (LASIX) 20 MG tablet Take 1 tablet (20 mg total) by mouth daily as needed for edema.   hydrochlorothiazide (HYDRODIURIL) 12.5 MG tablet TAKE 1 TABLET DAILY FOR    BLOOD PRESSURE CONTROL   Multiple Vitamins-Minerals (WOMENS MULTIVITAMIN PO) Take by mouth.   Omega-3 Fatty Acids (FISH OIL DOUBLE  STRENGTH) 1200 MG CAPS Take by mouth.   omeprazole (PRILOSEC) 40 MG capsule TAKE 1 CAPSULE DAILY FOR   REFLUX ESOPHAGITIS WITH    GASTRITIS   oxybutynin (DITROPAN-XL) 10 MG 24 hr tablet Take 10 mg by mouth daily.   POTASSIUM CITRATE PO Take by mouth daily.   sertraline (ZOLOFT) 100 MG tablet TAKE 1 AND 1/2 TABLETS     DAILY   valsartan (DIOVAN) 160 MG tablet TAKE 1 TABLET DAILY FOR BLOOD PRESSURE CONTROL   Aspirin Buf,CaCarb-MgCarb-MgO, 81 MG TABS aspirin 81 mg tablet   81 mg by oral route. (Patient not taking: Reported on 04/22/2022)   No facility-administered medications prior to visit.    Review of Systems  Constitutional:  Negative for fatigue and fever.  Respiratory:  Negative for cough and shortness of breath.   Cardiovascular:  Negative for chest pain and leg swelling.  Gastrointestinal:  Negative for abdominal pain.  Skin:        Lump right breast  Neurological:  Negative for dizziness and headaches.     Objective    BP (!) 144/75 (BP Location: Left Arm, Patient Position: Sitting, Cuff Size: Large)   Pulse 68   Wt 252 lb (114.3 kg)   SpO2 98%   BMI 37.21 kg/m    Physical Exam Vitals reviewed.  Constitutional:      Appearance: She is not ill-appearing.  HENT:  Head: Normocephalic.  Eyes:     Conjunctiva/sclera: Conjunctivae normal.  Cardiovascular:     Rate and Rhythm: Normal rate.  Pulmonary:     Effort: Pulmonary effort is normal. No respiratory distress.  Chest:     Comments: Right breast 9:00 bx site closer to nipple there is a small amount of firm tissue beneath. Both areas are closed and healing appropriately.  Some tenderness to touch Neurological:     General: No focal deficit present.     Mental Status: She is alert and oriented to person, place, and time.  Psychiatric:        Mood and Affect: Mood normal.        Behavior: Behavior normal.      No results found for any visits on 10/27/22.  Assessment & Plan     Small breast hematoma In  all likelihood, common s/p stereo. Advised if no improvement in 2-3 weeks to let me know and I would order an ultrasound to confirm. Does not appear that she is rejecting the clip, areas healing appropriately.    Ordered indicated labs as pt is fasting today, for her upcoming physical  Return in about 2 weeks (around 11/10/2022) for as scheduled.      I, [name], PA-C have reviewed all documentation for this visit. The documentation on  10/27/22  for the exam, diagnosis, procedures, and orders are all accurate and complete.  [name], PA-C [facility] [address] [place], [place], [address] Office: [phone] Fax: [place]

## 2022-10-28 LAB — COMPREHENSIVE METABOLIC PANEL
ALT: 22 IU/L (ref 0–32)
AST: 25 IU/L (ref 0–40)
Albumin/Globulin Ratio: 1.4 (ref 1.2–2.2)
Albumin: 4.1 g/dL (ref 3.9–4.9)
Alkaline Phosphatase: 92 IU/L (ref 44–121)
BUN/Creatinine Ratio: 11 — ABNORMAL LOW (ref 12–28)
BUN: 10 mg/dL (ref 8–27)
Bilirubin Total: 0.5 mg/dL (ref 0.0–1.2)
CO2: 25 mmol/L (ref 20–29)
Calcium: 9.9 mg/dL (ref 8.7–10.3)
Chloride: 103 mmol/L (ref 96–106)
Creatinine, Ser: 0.89 mg/dL (ref 0.57–1.00)
Globulin, Total: 3 g/dL (ref 1.5–4.5)
Glucose: 108 mg/dL — ABNORMAL HIGH (ref 70–99)
Potassium: 3.7 mmol/L (ref 3.5–5.2)
Sodium: 144 mmol/L (ref 134–144)
Total Protein: 7.1 g/dL (ref 6.0–8.5)
eGFR: 74 mL/min/{1.73_m2} (ref >59)

## 2022-10-28 LAB — CBC WITH DIFFERENTIAL/PLATELET
Basophils Absolute: 0 10*3/uL (ref 0.0–0.2)
Basos: 0 %
EOS (ABSOLUTE): 0.2 10*3/uL (ref 0.0–0.4)
Eos: 3 %
Hematocrit: 37.4 % (ref 34.0–46.6)
Hemoglobin: 11.7 g/dL (ref 11.1–15.9)
Immature Grans (Abs): 0 10*3/uL (ref 0.0–0.1)
Immature Granulocytes: 0 %
Lymphocytes Absolute: 3.3 10*3/uL — ABNORMAL HIGH (ref 0.7–3.1)
Lymphs: 46 %
MCH: 23.9 pg — ABNORMAL LOW (ref 26.6–33.0)
MCHC: 31.3 g/dL — ABNORMAL LOW (ref 31.5–35.7)
MCV: 77 fL — ABNORMAL LOW (ref 79–97)
Monocytes Absolute: 0.7 10*3/uL (ref 0.1–0.9)
Monocytes: 9 %
Neutrophils Absolute: 3.1 10*3/uL (ref 1.4–7.0)
Neutrophils: 42 %
Platelets: 241 10*3/uL (ref 150–450)
RBC: 4.89 x10E6/uL (ref 3.77–5.28)
RDW: 13.3 % (ref 11.7–15.4)
WBC: 7.2 10*3/uL (ref 3.4–10.8)

## 2022-10-28 LAB — LIPID PANEL
Chol/HDL Ratio: 4 ratio (ref 0.0–4.4)
Cholesterol, Total: 149 mg/dL (ref 100–199)
HDL: 37 mg/dL — ABNORMAL LOW (ref >39)
LDL Chol Calc (NIH): 93 mg/dL (ref 0–99)
Triglycerides: 104 mg/dL (ref 0–149)
VLDL Cholesterol Cal: 19 mg/dL (ref 5–40)

## 2022-10-28 LAB — HEMOGLOBIN A1C
Est. average glucose Bld gHb Est-mCnc: 146 mg/dL
Hgb A1c MFr Bld: 6.7 % — ABNORMAL HIGH (ref 4.8–5.6)

## 2022-11-11 ENCOUNTER — Encounter: Payer: Self-pay | Admitting: Physician Assistant

## 2022-11-11 ENCOUNTER — Ambulatory Visit (INDEPENDENT_AMBULATORY_CARE_PROVIDER_SITE_OTHER): Payer: BC Managed Care – PPO | Admitting: Physician Assistant

## 2022-11-11 VITALS — BP 129/66 | HR 87 | Wt 254.0 lb

## 2022-11-11 DIAGNOSIS — R519 Headache, unspecified: Secondary | ICD-10-CM

## 2022-11-11 DIAGNOSIS — G43909 Migraine, unspecified, not intractable, without status migrainosus: Secondary | ICD-10-CM

## 2022-11-11 MED ORDER — PROMETHAZINE HCL 50 MG/ML IJ SOLN: 25.0000 mg | Freq: Four times a day (QID) | INTRAMUSCULAR | Status: AC | PRN

## 2022-11-11 MED ORDER — KETOROLAC TROMETHAMINE 10 MG PO TABS: 10.0000 mg | ORAL_TABLET | Freq: Four times a day (QID) | ORAL | 0 refills | Status: DC | PRN

## 2022-11-11 MED ORDER — KETOROLAC TROMETHAMINE 30 MG/ML IJ SOLN: 30.0000 mg | Freq: Once | INTRAMUSCULAR | Status: AC

## 2022-11-11 NOTE — Progress Notes (Deleted)
I,Miranda Moore,acting as a Education administrator for [employer], PA-C.,have documented all relevant documentation on the behalf of Miranda Kirschner, PA-C,as directed by  Miranda Kirschner, PA-C while in the presence of Miranda Kirschner, PA-C.   Complete physical exam   Patient: Miranda Moore   DOB: [birth year]   62 y.o. Female  MRN: [id number] Visit Date: 11/11/2022  Today's healthcare provider: [name], PA-C   No chief complaint on file.  Subjective    Miranda ALSBROOKS is a 62 y.o. female who presents today for a complete physical exam.  She reports consuming a {diet types:17450} diet. {Exercise:19826} She generally feels {well/fairly well/poorly:18703}. She reports sleeping {well/fairly well/poorly:18703}. She {does/does not:200015} have additional problems to discuss today.  HPI  Patient reports having a migraine/headache since Monday and has not gotten any better. Started at 4 am Monday morning. Has taken 500 mg tylenol X$ and her daughter also got her some migraine medication but that did not work. -Eye Exam: 2024, [facility] in [place]  Past Medical History:  Diagnosis Date   Anxiety    Depression    GERD (gastroesophageal reflux disease)    Heart murmur    Hypertension    MRSA infection 2007   left lower leg   Pre-diabetes    Sleep apnea    uses CPAP nightly   Past Surgical History:  Procedure Laterality Date   ABDOMINAL HYSTERECTOMY  2002   APPENDECTOMY  2013   laparascopic with removal of tubal remnant   BREAST BIOPSY Right 10/14/2022   Right Breast Stereo Bx X Clip- path pending   BREAST BIOPSY Right 10/14/2022   Right Breast Stereo Bx Coil Clip path pending   BREAST BIOPSY Right 10/14/2022   MM RT BREAST BX W LOC DEV 1ST LESION IMAGE BX SPEC STEREO GUIDE 10/14/2022 [facility]   BREAST BIOPSY Right 10/14/2022   MM RT BREAST BX W LOC DEV EA AD LESION IMG BX SPEC STEREO GUIDE 10/14/2022 [facility]   COLONOSCOPY  2015   COLONOSCOPY WITH PROPOFOL N/A 06/04/2020    Procedure: COLONOSCOPY WITH BIOPSY;  Surgeon: Virgel Manifold, MD;  Location: [place];  Service: Endoscopy;  Laterality: N/A;   KNEE SURGERY  2012   LAPAROSCOPIC OOPHORECTOMY  2012   POLYPECTOMY N/A 06/04/2020   Procedure: POLYPECTOMY;  Surgeon: Virgel Manifold, MD;  Location: [place];  Service: Endoscopy;  Laterality: N/A;   WRIST ARTHROSCOPY WITH DEBRIDEMENT Right 08/30/2018   Procedure: WRIST ARTHROSCOPY WITH DEBRIDEMENT AND SYNOVECTOMY;  Surgeon: Roseanne Kaufman, MD;  Location: [place];  Service: Orthopedics;  Laterality: Right;   WRIST SURGERY  02/11/2021   Social History   Socioeconomic History   Marital status: Married    Spouse name: Not on file   Number of children: Not on file   Years of education: Not on file   Highest education level: Not on file  Occupational History   Not on file  Tobacco Use   Smoking status: Never   Smokeless tobacco: Never  Substance and Sexual Activity   Alcohol use: Yes    Alcohol/week: 0.0 standard drinks of alcohol    Comment: ocassionally   Drug use: No   Sexual activity: Not on file  Other Topics Concern   Not on file  Social History Narrative   Not on file   Social Determinants of Health   Financial Resource Strain: Not on file  Food Insecurity: Not on file  Transportation Needs: Not on file  Physical  Activity: Not on file  Stress: Not on file  Social Connections: Not on file  Intimate Partner Violence: Not on file   Family Status  Relation Name Status   Mother  Deceased   Father  Alive   Brother  Alive   Florence  Deceased   PGF  Deceased   MGM  Deceased   MGF  Deceased   Sister  Alive   Ethlyn Daniels  (Not Specified)   Family History  Problem Relation Age of Onset   Cancer Mother        colon   Heart failure Mother    Heart disease Father    Hypertension Father    Hypertension Brother    Heart failure Paternal Grandmother    Dementia Paternal Grandfather    Breast  cancer Paternal Aunt    Allergies  Allergen Reactions   Chlorpheniramine-Phenylephrine Hives   Elemental Sulfur Other (See Comments)    Low b/p   Other Hives    actifed   Sulfa Antibiotics     Patient Care Team: Miranda Kirschner, PA-C as PCP - General (Physician Assistant) Christene Lye, MD (General Surgery) Robert Bellow, MD (General Surgery) [facility], [place], [place]   Medications: Outpatient Medications Prior to Visit  Medication Sig   allopurinol (ZYLOPRIM) 100 MG tablet TAKE 1 TABLET BY MOUTH EVERY DAY   ALPRAZolam (XANAX) 0.5 MG tablet TAKE 1/2 - 1 TABLET (0.25 - 0.5 MG TOTAL) BY MOUTH TWICE A DAY AS NEEDED FOR ANXIETY   amLODipine (NORVASC) 10 MG tablet TAKE 1 TABLET DAILY FOR    BLOOD PRESSURE CONTROL   aspirin 81 MG tablet Take 81 mg by mouth daily.    Aspirin Buf,CaCarb-MgCarb-MgO, 81 MG TABS aspirin 81 mg tablet   81 mg by oral route. (Patient not taking: Reported on 04/22/2022)   atorvastatin (LIPITOR) 10 MG tablet TAKE 1 TABLET DAILY   Calcium Carbonate-Vitamin D 600-200 MG-UNIT TABS    Cholecalciferol (VITAMIN D-3) 1000 UNITS CAPS Take by mouth daily.   colchicine 0.6 MG tablet FOR ACUTE GOUT, TAKE TWO TABLETS ON DAY 1 AND THEN ONE HOUR LATER, TAKE A THIRD TABLET TO TOTAL 1.8 MG DAILY. FOR PREVENTION, TAKE ONE 0.6 MG TABLET DAILY WITH ALLOPURINOL. (Patient taking differently: as needed. For ACUTE gout, take two tablets on day 1 and then one hour later, take a third tablet to total 1.8 mg daily. For prevention, take one 0.6 mg tablet daily with allopurinol.)   furosemide (LASIX) 20 MG tablet Take 1 tablet (20 mg total) by mouth daily as needed for edema.   hydrochlorothiazide (HYDRODIURIL) 12.5 MG tablet TAKE 1 TABLET DAILY FOR    BLOOD PRESSURE CONTROL   Multiple Vitamins-Minerals (WOMENS MULTIVITAMIN PO) Take by mouth.   Omega-3 Fatty Acids (FISH OIL DOUBLE STRENGTH) 1200 MG CAPS Take by mouth.   omeprazole (PRILOSEC) 40 MG capsule TAKE 1  CAPSULE DAILY FOR   REFLUX ESOPHAGITIS WITH    GASTRITIS   oxybutynin (DITROPAN-XL) 10 MG 24 hr tablet Take 10 mg by mouth daily.   POTASSIUM CITRATE PO Take by mouth daily.   sertraline (ZOLOFT) 100 MG tablet TAKE 1 AND 1/2 TABLETS     DAILY   valsartan (DIOVAN) 160 MG tablet TAKE 1 TABLET DAILY FOR BLOOD PRESSURE CONTROL   No facility-administered medications prior to visit.    Review of Systems  Neurological:  Positive for headaches.    {Labs  Heme  Chem  Endocrine  Serology  Results Review (optional):23779}  Objective    There were no vitals taken for this visit. {Show previous vital signs (optional):23777}   Physical Exam  ***  Last depression screening scores    10/27/2022    9:39 AM 04/22/2022    2:48 PM 11/06/2021    8:54 AM  PHQ 2/9 Scores  PHQ - 2 Score 2 3 2   PHQ- 9 Score 3 5    Last fall risk screening    10/27/2022    9:39 AM  Fall Risk   Number falls in past yr: 0  Injury with Fall? 0  Follow up Falls evaluation completed   Last Audit-C alcohol use screening    04/22/2022    2:49 PM  Alcohol Use Disorder Test (AUDIT)  1. How often do you have a drink containing alcohol? 1  2. How many drinks containing alcohol do you have on a typical day when you are drinking? 0  3. How often do you have six or more drinks on one occasion? 0  AUDIT-C Score 1   A score of 3 or more in women, and 4 or more in men indicates increased risk for alcohol abuse, EXCEPT if all of the points are from question 1   No results found for any visits on 11/11/22.  Assessment & Plan    Routine Health Maintenance and Physical Exam  Exercise Activities and Dietary recommendations  Goals   None     Immunization History  Administered Date(s) Administered   Influenza,inj,Quad PF,6+ Mos 04/22/2022   Influenza-Unspecified 05/11/2015   PFIZER Comirnaty(Gray Top)Covid-19 Tri-Sucrose Vaccine 10/06/2019, 10/27/2019, 05/24/2020   Tdap 05/28/2011    Health Maintenance  Topic  Date Due   OPHTHALMOLOGY EXAM  Never done   Zoster Vaccines- Shingrix (1 of 2) Never done   DTaP/Tdap/Td (2 - Td or Tdap) 05/27/2021   COVID-19 Vaccine (4 - 2023-24 season) 04/10/2022   Diabetic kidney evaluation - Urine ACR  11/07/2022   Hepatitis C Screening  04/23/2023 (Originally 11/09/1978)   HIV Screening  04/23/2023 (Originally 11/09/1975)   INFLUENZA VACCINE  03/11/2023   FOOT EXAM  04/23/2023   HEMOGLOBIN A1C  04/29/2023   MAMMOGRAM  10/01/2023   Diabetic kidney evaluation - eGFR measurement  10/27/2023   COLONOSCOPY (Pts 45-22yrs Insurance coverage will need to be confirmed)  06/04/2025   HPV VACCINES  Aged Out    Discussed health benefits of physical activity, and encouraged her to engage in regular exercise appropriate for her age and condition.  ***  No follow-ups on file.     {provider attestation***:1}   Miranda Kirschner, PA-C  [place] [phone] (phone) [phone] (fax)  [place]

## 2022-11-11 NOTE — Progress Notes (Signed)
I,Sha'taria Tyson,acting as a Education administrator for [employer], PA-C.,have documented all relevant documentation on the behalf of [name], PA-C,as directed by  [name], PA-C while in the presence of [name], PA-C.   Established patient visit   Patient: Miranda Moore   DOB: [birth year]   62 y.o. Female  MRN: [id number] Visit Date: 11/11/2022  Today's healthcare provider: [name], PA-C  Cc. Headache x 3 days  Subjective    HPI  Pt reports headache at the back of her head persistent since Monday x 3 days. She reports taking tylenol and other migraine medicine OTC with no improvement. Associated nausea, sensitivity to light. Denies vision changes, weakness, numbness.   Medications: Outpatient Medications Prior to Visit  Medication Sig   allopurinol (ZYLOPRIM) 100 MG tablet TAKE 1 TABLET BY MOUTH EVERY DAY   ALPRAZolam (XANAX) 0.5 MG tablet TAKE 1/2 - 1 TABLET (0.25 - 0.5 MG TOTAL) BY MOUTH TWICE A DAY AS NEEDED FOR ANXIETY   amLODipine (NORVASC) 10 MG tablet TAKE 1 TABLET DAILY FOR    BLOOD PRESSURE CONTROL   aspirin 81 MG tablet Take 81 mg by mouth daily.    Aspirin Buf,CaCarb-MgCarb-MgO, 81 MG TABS    atorvastatin (LIPITOR) 10 MG tablet TAKE 1 TABLET DAILY   Calcium Carbonate-Vitamin D 600-200 MG-UNIT TABS    Cholecalciferol (VITAMIN D-3) 1000 UNITS CAPS Take by mouth daily.   colchicine 0.6 MG tablet FOR ACUTE GOUT, TAKE TWO TABLETS ON DAY 1 AND THEN ONE HOUR LATER, TAKE A THIRD TABLET TO TOTAL 1.8 MG DAILY. FOR PREVENTION, TAKE ONE 0.6 MG TABLET DAILY WITH ALLOPURINOL. (Patient taking differently: as needed. For ACUTE gout, take two tablets on day 1 and then one hour later, take a third tablet to total 1.8 mg daily. For prevention, take one 0.6 mg tablet daily with allopurinol.)   furosemide (LASIX) 20 MG tablet Take 1 tablet (20 mg total) by mouth daily as needed for edema.   hydrochlorothiazide (HYDRODIURIL) 12.5 MG tablet TAKE 1 TABLET DAILY FOR    BLOOD  PRESSURE CONTROL   methocarbamol (ROBAXIN) 500 MG tablet Take 1,000 mg by mouth 2 (two) times daily.   Multiple Vitamins-Minerals (WOMENS MULTIVITAMIN PO) Take by mouth.   Omega-3 Fatty Acids (FISH OIL DOUBLE STRENGTH) 1200 MG CAPS Take by mouth.   omeprazole (PRILOSEC) 40 MG capsule TAKE 1 CAPSULE DAILY FOR   REFLUX ESOPHAGITIS WITH    GASTRITIS   oxybutynin (DITROPAN-XL) 10 MG 24 hr tablet Take 10 mg by mouth daily.   POTASSIUM CITRATE PO Take by mouth daily.   sertraline (ZOLOFT) 100 MG tablet TAKE 1 AND 1/2 TABLETS     DAILY   valsartan (DIOVAN) 160 MG tablet TAKE 1 TABLET DAILY FOR BLOOD PRESSURE CONTROL   No facility-administered medications prior to visit.    Review of Systems  Constitutional:  Negative for fatigue and fever.  Eyes:  Positive for photophobia.  Respiratory:  Negative for cough and shortness of breath.   Cardiovascular:  Negative for chest pain and leg swelling.  Gastrointestinal:  Negative for abdominal pain.  Neurological:  Positive for headaches. Negative for dizziness.      Objective    BP 129/66 (BP Location: Right Arm, Patient Position: Sitting, Cuff Size: Large)   Pulse 87   Wt 254 lb (115.2 kg)   SpO2 97%   BMI 37.51 kg/m   Physical Exam Constitutional:      General: She is awake.  Appearance: She is well-developed.  HENT:     Head: Normocephalic.  Eyes:     Extraocular Movements: Extraocular movements intact.     Conjunctiva/sclera: Conjunctivae normal.     Pupils: Pupils are equal, round, and reactive to light.  Cardiovascular:     Rate and Rhythm: Normal rate and regular rhythm.     Heart sounds: Normal heart sounds.  Pulmonary:     Effort: Pulmonary effort is normal.     Breath sounds: Normal breath sounds.  Skin:    General: Skin is warm.  Neurological:     Mental Status: She is alert and oriented to person, place, and time.  Psychiatric:        Attention and Perception: Attention normal.        Mood and Affect: Mood normal.         Speech: Speech normal.        Behavior: Behavior is cooperative.     No results found for any visits on 11/11/22.  Assessment & Plan     Acute headache Vitals stable, no weakness, facial asymmetry or visual changes Given Im toradol 30 mg and phenergan 25 mg in office.   Pt reports feeling slightly better 10 minutes after injections. Going home to rest.  Return if symptoms worsen or fail to improve.      I, Alfredia Ferguson, PA-C have reviewed all documentation for this visit. The documentation on  11/11/22  for the exam, diagnosis, procedures, and orders are all accurate and complete.  Alfredia Ferguson, PA-C [facility] [address] [place], [place], [address] Office: [phone] Fax: [phone]   [facility]

## 2022-11-20 ENCOUNTER — Telehealth: Payer: Self-pay

## 2022-11-20 ENCOUNTER — Other Ambulatory Visit: Payer: Self-pay | Admitting: Physician Assistant

## 2022-11-20 ENCOUNTER — Encounter: Payer: Self-pay | Admitting: Physician Assistant

## 2022-11-20 DIAGNOSIS — G4733 Obstructive sleep apnea (adult) (pediatric): Secondary | ICD-10-CM

## 2022-11-20 NOTE — Telephone Encounter (Signed)
Copied from CRM [id number]. Topic: General - Other >> Nov 20, 2022 12:05 PM Carrielelia G wrote: Reason for CRM: patient would like to try and see if she qualifies for the  Inspire sleep apnea implant device Patient states she also needs the cpt code and diagnosis code to see if insurance will cover it  Patient will also need a referral to a provider who can place the device Patient is okay if you respond in her mychart account .Marland Kitchen  Please advise Thank You

## 2022-11-20 NOTE — Telephone Encounter (Signed)
Sent pt a mychart message. 

## 2022-11-25 ENCOUNTER — Encounter: Payer: Self-pay | Admitting: Physician Assistant

## 2022-11-30 LAB — HM DIABETES EYE EXAM

## 2022-12-01 ENCOUNTER — Other Ambulatory Visit: Payer: Self-pay | Admitting: Physician Assistant

## 2022-12-01 DIAGNOSIS — R600 Localized edema: Secondary | ICD-10-CM

## 2022-12-24 ENCOUNTER — Other Ambulatory Visit: Payer: Self-pay | Admitting: Physician Assistant

## 2022-12-24 DIAGNOSIS — R519 Headache, unspecified: Secondary | ICD-10-CM

## 2022-12-25 ENCOUNTER — Other Ambulatory Visit: Payer: Self-pay | Admitting: Physician Assistant

## 2022-12-25 DIAGNOSIS — R519 Headache, unspecified: Secondary | ICD-10-CM

## 2022-12-25 MED ORDER — KETOROLAC TROMETHAMINE 10 MG PO TABS: 10.0000 mg | ORAL_TABLET | Freq: Four times a day (QID) | ORAL | 0 refills | Status: DC | PRN

## 2023-01-09 ENCOUNTER — Other Ambulatory Visit: Payer: Self-pay | Admitting: Physician Assistant

## 2023-01-09 ENCOUNTER — Other Ambulatory Visit: Payer: Self-pay | Admitting: Family Medicine

## 2023-01-09 DIAGNOSIS — I10 Essential (primary) hypertension: Secondary | ICD-10-CM

## 2023-01-11 NOTE — Telephone Encounter (Signed)
Requested Prescriptions  Pending Prescriptions Disp Refills   valsartan (DIOVAN) 160 MG tablet [Pharmacy Med Name: VALSARTAN 160 MG TABLET] 90 tablet 1    Sig: TAKE 1 TABLET DAILY FOR BLOOD PRESSURE CONTROL     Cardiovascular:  Angiotensin Receptor Blockers Passed - 01/09/2023  6:21 PM      Passed - Cr in normal range and within 180 days    Creatinine  Date Value Ref Range Status  06/28/2014 1.22 0.60 - 1.30 mg/dL Final   Creatinine, Ser  Date Value Ref Range Status  10/27/2022 0.89 0.57 - 1.00 mg/dL Final   Creatinine, POC  Date Value Ref Range Status  11/10/2016 NA mg/dL Final         Passed - K in normal range and within 180 days    Potassium  Date Value Ref Range Status  10/27/2022 3.7 3.5 - 5.2 mmol/L Final  06/28/2014 3.4 (L) 3.5 - 5.1 mmol/L Final         Passed - Patient is not pregnant      Passed - Last BP in normal range    BP Readings from Last 1 Encounters:  11/11/22 129/66         Passed - Valid encounter within last 6 months    Recent Outpatient Visits           2 months ago Acute intractable headache, unspecified headache type   [facility] Alfredia Ferguson, PA-C   2 months ago Breast hematoma   [facility] Alfredia Ferguson, PA-C   8 months ago Benign essential HTN   [facility] Alfredia Ferguson, PA-C   1 year ago Encounter for health maintenance examination   [facility] Alfredia Ferguson, PA-C   1 year ago Type 2 diabetes mellitus without complication, without long-term current use of insulin [facility])   [facility] Sherrie Mustache, Demetrios Isaacs, MD

## 2023-01-29 ENCOUNTER — Encounter: Payer: Self-pay | Admitting: Ophthalmology

## 2023-02-01 NOTE — Discharge Instructions (Signed)

## 2023-02-03 ENCOUNTER — Other Ambulatory Visit
Admission: RE | Admit: 2023-02-03 | Discharge: 2023-02-03 | Disposition: A | Discharge status: Home / Self Care | Payer: BC Managed Care – PPO | Attending: Urgent Care | Admitting: Urgent Care

## 2023-02-03 ENCOUNTER — Encounter
Admission: RE | Disposition: A | Discharge status: Home / Self Care | Payer: Self-pay | Source: Home / Self Care | Attending: Ophthalmology

## 2023-02-03 ENCOUNTER — Ambulatory Visit
Admission: RE | Admit: 2023-02-03 | Discharge: 2023-02-03 | Disposition: A | Discharge status: Home / Self Care | Payer: BC Managed Care – PPO | Attending: Ophthalmology | Admitting: Ophthalmology

## 2023-02-03 ENCOUNTER — Ambulatory Visit: Payer: BC Managed Care – PPO | Admitting: Anesthesiology

## 2023-02-03 ENCOUNTER — Other Ambulatory Visit: Payer: Self-pay

## 2023-02-03 DIAGNOSIS — Z01818 Encounter for other preprocedural examination: Secondary | ICD-10-CM

## 2023-02-03 DIAGNOSIS — E1136 Type 2 diabetes mellitus with diabetic cataract: Secondary | ICD-10-CM | POA: Insufficient documentation

## 2023-02-03 DIAGNOSIS — F32A Depression, unspecified: Secondary | ICD-10-CM | POA: Insufficient documentation

## 2023-02-03 DIAGNOSIS — F419 Anxiety disorder, unspecified: Secondary | ICD-10-CM | POA: Insufficient documentation

## 2023-02-03 DIAGNOSIS — K219 Gastro-esophageal reflux disease without esophagitis: Secondary | ICD-10-CM | POA: Insufficient documentation

## 2023-02-03 DIAGNOSIS — I10 Essential (primary) hypertension: Secondary | ICD-10-CM | POA: Diagnosis not present

## 2023-02-03 DIAGNOSIS — H2511 Age-related nuclear cataract, right eye: Secondary | ICD-10-CM | POA: Diagnosis present

## 2023-02-03 HISTORY — PX: CATARACT EXTRACTION W/PHACO: SHX586

## 2023-02-03 LAB — POTASSIUM: Potassium: 3.2 mmol/L — ABNORMAL LOW (ref 3.5–5.1)

## 2023-02-03 SURGERY — PHACOEMULSIFICATION, CATARACT, WITH IOL INSERTION
Anesthesia: Monitor Anesthesia Care | Laterality: Right

## 2023-02-03 MED ORDER — ARMC OPHTHALMIC DILATING DROPS
1.0000 | OPHTHALMIC | Status: DC | PRN
  Administered 2023-02-03 (×3): 1 via OPHTHALMIC

## 2023-02-03 MED ORDER — CEFUROXIME OPHTHALMIC INJECTION 1 MG/0.1 ML
INJECTION | OPHTHALMIC | Status: DC | PRN
  Administered 2023-02-03: 1 mg via INTRACAMERAL

## 2023-02-03 MED ORDER — SIGHTPATH DOSE#1 BSS IO SOLN
INTRAOCULAR | Status: DC | PRN
  Administered 2023-02-03: 2 mL

## 2023-02-03 MED ORDER — SIGHTPATH DOSE#1 NA HYALUR & NA CHOND-NA HYALUR IO KIT
PACK | INTRAOCULAR | Status: DC | PRN
  Administered 2023-02-03: 1 via OPHTHALMIC

## 2023-02-03 MED ORDER — BRIMONIDINE TARTRATE-TIMOLOL 0.2-0.5 % OP SOLN
OPHTHALMIC | Status: DC | PRN
  Administered 2023-02-03: 1 [drp] via OPHTHALMIC

## 2023-02-03 MED ORDER — TETRACAINE HCL 0.5 % OP SOLN
1.0000 [drp] | OPHTHALMIC | Status: DC | PRN
  Administered 2023-02-03 (×3): 1 [drp] via OPHTHALMIC

## 2023-02-03 MED ORDER — FENTANYL CITRATE (PF) 100 MCG/2ML IJ SOLN
INTRAMUSCULAR | Status: DC | PRN
  Administered 2023-02-03 (×2): 50 ug via INTRAVENOUS

## 2023-02-03 MED ORDER — SIGHTPATH DOSE#1 BSS IO SOLN
INTRAOCULAR | Status: DC | PRN
  Administered 2023-02-03: 15 mL via INTRAOCULAR

## 2023-02-03 MED ORDER — SIGHTPATH DOSE#1 BSS IO SOLN
INTRAOCULAR | Status: DC | PRN
  Administered 2023-02-03: 93 mL via OPHTHALMIC

## 2023-02-03 MED ORDER — MIDAZOLAM HCL 2 MG/2ML IJ SOLN
INTRAMUSCULAR | Status: DC | PRN
  Administered 2023-02-03: 2 mg via INTRAVENOUS

## 2023-02-03 MED ORDER — LACTATED RINGERS IV SOLN: INTRAVENOUS | Status: DC

## 2023-02-03 SURGICAL SUPPLY — 9 items
CATARACT SUITE SIGHTPATH (MISCELLANEOUS) ×1 IMPLANT
FEE CATARACT SUITE SIGHTPATH (MISCELLANEOUS) ×1 IMPLANT
GLOVE SRG 8 PF TXTR STRL LF DI (GLOVE) ×1 IMPLANT
GLOVE SURG ENC TEXT LTX SZ7.5 (GLOVE) ×1 IMPLANT
GLOVE SURG UNDER POLY LF SZ8 (GLOVE) ×1
LENS IOL TECNIS EYHANCE 23.5 (Intraocular Lens) IMPLANT
NDL FILTER BLUNT 18X1 1/2 (NEEDLE) ×1 IMPLANT
NEEDLE FILTER BLUNT 18X1 1/2 (NEEDLE) ×1 IMPLANT
SYR 3ML LL SCALE MARK (SYRINGE) ×1 IMPLANT

## 2023-02-03 NOTE — H&P (Signed)
[facility]   Primary Care Physician:  Malva Limes, MD Ophthalmologist: Dr. Lockie Mola  Pre-Procedure History & Physical: HPI:  Miranda Moore is a 62 y.o. female here for ophthalmic surgery.   Past Medical History:  Diagnosis Date   Anxiety    Depression    GERD (gastroesophageal reflux disease)    Heart murmur    Hypertension    MRSA infection 2007   left lower leg   Pre-diabetes    Sleep apnea    uses CPAP nightly    Past Surgical History:  Procedure Laterality Date   ABDOMINAL HYSTERECTOMY  2002   APPENDECTOMY  2013   laparascopic with removal of tubal remnant   BREAST BIOPSY Right 10/14/2022   Right Breast Stereo Bx X Clip- path pending   BREAST BIOPSY Right 10/14/2022   Right Breast Stereo Bx Coil Clip path pending   BREAST BIOPSY Right 10/14/2022   MM RT BREAST BX W LOC DEV 1ST LESION IMAGE BX SPEC STEREO GUIDE 10/14/2022 [facility]   BREAST BIOPSY Right 10/14/2022   MM RT BREAST BX W LOC DEV EA AD LESION IMG BX SPEC STEREO GUIDE 10/14/2022 [facility]   COLONOSCOPY  2015   COLONOSCOPY WITH PROPOFOL N/A 06/04/2020   Procedure: COLONOSCOPY WITH BIOPSY;  Surgeon: Pasty Spillers, MD;  [facility];  Service: Endoscopy;  Laterality: N/A;   KNEE SURGERY  2012   LAPAROSCOPIC OOPHORECTOMY  2012   POLYPECTOMY N/A 06/04/2020   Procedure: POLYPECTOMY;  Surgeon: Pasty Spillers, MD;  Location: [facility];  Service: Endoscopy;  Laterality: N/A;   WRIST ARTHROSCOPY WITH DEBRIDEMENT Right 08/30/2018   Procedure: WRIST ARTHROSCOPY WITH DEBRIDEMENT AND SYNOVECTOMY;  Surgeon: Dominica Severin, MD;  Location: [facility];  Service: Orthopedics;  Laterality: Right;   WRIST SURGERY  02/11/2021    Prior to Admission medications   Medication Sig Start Date End Date Taking? Authorizing Provider  allopurinol (ZYLOPRIM) 100 MG tablet TAKE 1 TABLET BY MOUTH EVERY DAY 05/18/22  Yes Drubel, Lillia Abed, PA-C   ALPRAZolam (XANAX) 0.5 MG tablet TAKE 1/2 - 1 TABLET (0.25 - 0.5 MG TOTAL) BY MOUTH TWICE A DAY AS NEEDED FOR ANXIETY 09/22/22  Yes Drubel, Lillia Abed, PA-C  amLODipine (NORVASC) 10 MG tablet TAKE 1 TABLET DAILY FOR    BLOOD PRESSURE CONTROL 12/01/22  Yes Alfredia Ferguson, PA-C  aspirin 81 MG tablet Take 81 mg by mouth daily.    Yes [provider]  atorvastatin (LIPITOR) 10 MG tablet TAKE 1 TABLET DAILY 08/18/22  Yes Alfredia Ferguson, PA-C  Calcium Carbonate-Vitamin D 600-200 MG-UNIT TABS  06/11/05  Yes [provider]  Cholecalciferol (VITAMIN D-3) 1000 UNITS CAPS Take by mouth daily.   Yes [provider]  colchicine 0.6 MG tablet FOR ACUTE GOUT, TAKE TWO TABLETS ON DAY 1 AND THEN ONE HOUR LATER, TAKE A THIRD TABLET TO TOTAL 1.8 MG DAILY. FOR PREVENTION, TAKE ONE 0.6 MG TABLET DAILY WITH ALLOPURINOL. Patient taking differently: as needed. For ACUTE gout, take two tablets on day 1 and then one hour later, take a third tablet to total 1.8 mg daily. For prevention, take one 0.6 mg tablet daily with allopurinol. 06/12/20  Yes Chrismon, Jodell Cipro, PA-C  furosemide (LASIX) 20 MG tablet TAKE 1 TABLET (20 MG TOTAL) BY MOUTH DAILY AS NEEDED FOR EDEMA. 12/01/22  Yes Alfredia Ferguson, PA-C  hydrochlorothiazide (HYDRODIURIL) 12.5 MG tablet TAKE 1 TABLET DAILY FOR    BLOOD PRESSURE CONTROL 01/11/23  Yes  Alfredia Ferguson, PA-C  ketorolac (TORADOL) 10 MG tablet Take 1 tablet (10 mg total) by mouth every 6 (six) hours as needed. 12/25/22  Yes Drubel, Lillia Abed, PA-C  methocarbamol (ROBAXIN) 500 MG tablet Take 1,000 mg by mouth 2 (two) times daily. 11/10/22  Yes [provider]  Multiple Vitamins-Minerals (WOMENS MULTIVITAMIN PO) Take by mouth.   Yes [provider]  Omega-3 Fatty Acids (FISH OIL DOUBLE STRENGTH) 1200 MG CAPS Take by mouth. 12/09/10  Yes [provider]  omeprazole (PRILOSEC) 40 MG capsule TAKE 1 CAPSULE DAILY FOR   REFLUX ESOPHAGITIS WITH    GASTRITIS 07/01/22  Yes  Drubel, Lillia Abed, PA-C  oxybutynin (DITROPAN-XL) 10 MG 24 hr tablet Take 10 mg by mouth daily. 04/11/22  Yes [provider]  POTASSIUM CITRATE PO Take by mouth daily.   Yes [provider]  sertraline (ZOLOFT) 100 MG tablet TAKE 1 AND 1/2 TABLETS     DAILY 09/22/22  Yes Drubel, Lillia Abed, PA-C  valsartan (DIOVAN) 160 MG tablet TAKE 1 TABLET DAILY FOR BLOOD PRESSURE CONTROL 01/11/23  Yes Alfredia Ferguson, PA-C  Aspirin Buf,CaCarb-MgCarb-MgO, 81 MG TABS     [provider]    Allergies as of 12/02/2022 - Review Complete 11/11/2022  Allergen Reaction Noted   Chlorpheniramine-phenylephrine Hives 05/31/2015   Elemental sulfur Other (See Comments) 11/29/2014   Other Hives 11/29/2014   Sulfa antibiotics Other (See Comments) 05/31/2015   Sulfur Other (See Comments) 11/29/2014   Triprolidine-pseudoephedrine Other (See Comments) 11/11/2022    Family History  Problem Relation Age of Onset   Cancer Mother        colon   Heart failure Mother    Heart disease Father    Hypertension Father    Hypertension Brother    Heart failure Paternal Grandmother    Dementia Paternal Grandfather    Breast cancer Paternal Aunt     Social History   Socioeconomic History   Marital status: Married    Spouse name: Not on file   Number of children: Not on file   Years of education: Not on file   Highest education level: Not on file  Occupational History   Not on file  Tobacco Use   Smoking status: Never   Smokeless tobacco: Never  Substance and Sexual Activity   Alcohol use: Yes    Alcohol/week: 0.0 standard drinks of alcohol    Comment: ocassionally   Drug use: No   Sexual activity: Not on file  Other Topics Concern   Not on file  Social History Narrative   Not on file   Social Determinants of Health   Financial Resource Strain: Not on file  Food Insecurity: Not on file  Transportation Needs: Not on file  Physical Activity: Not on file  Stress: Not on file  Social  Connections: Not on file  Intimate Partner Violence: Not on file    Review of Systems: See HPI, otherwise negative ROS  Physical Exam: Ht 5\' 9"  (1.753 m)   Wt 113.4 kg   BMI 36.92 kg/m  General:   Alert,  pleasant and cooperative in NAD Head:  Normocephalic and atraumatic. Lungs:  Clear to auscultation.    Heart:  Regular rate and rhythm.   Impression/Plan: Miranda Moore is here for ophthalmic surgery.  Risks, benefits, limitations, and alternatives regarding ophthalmic surgery have been reviewed with the patient.  Questions have been answered.  All parties agreeable.   Lockie Mola, MD  02/03/2023, 9:19 AM

## 2023-02-03 NOTE — Op Note (Signed)
LOCATION:  [facility]   PREOPERATIVE DIAGNOSIS:    Nuclear sclerotic cataract right eye. H25.11   POSTOPERATIVE DIAGNOSIS:  Nuclear sclerotic cataract right eye.     PROCEDURE:  Phacoemusification with posterior chamber intraocular lens placement of the right eye   ULTRASOUND TIME: Procedure(s): CATARACT EXTRACTION PHACO AND INTRAOCULAR LENS PLACEMENT (IOC) RIGHT DIABETIC 2.95 00:24.4 (Right)  LENS:   Implant Name Type Inv. Item Serial No. Manufacturer Lot No. LRB No. Used Action  LENS IOL TECNIS EYHANCE 23.5 - [id number] Intraocular Lens LENS IOL TECNIS EYHANCE 23.5  [employer]  Right 1 Implanted         SURGEON:  Deirdre Evener, MD   ANESTHESIA:  Topical with tetracaine drops and 2% Xylocaine jelly, augmented with 1% preservative-free intracameral lidocaine.    COMPLICATIONS:  None.   DESCRIPTION OF PROCEDURE:  The patient was identified in the holding room and transported to the operating room and placed in the supine position under the operating microscope.  The right eye was identified as the operative eye and it was prepped and draped in the usual sterile ophthalmic fashion.   A 1 millimeter clear-corneal paracentesis was made at the 12:00 position.  0.5 ml of preservative-free 1% lidocaine was injected into the anterior chamber. The anterior chamber was filled with Viscoat viscoelastic.  A 2.4 millimeter keratome was used to make a near-clear corneal incision at the 9:00 position.  A curvilinear capsulorrhexis was made with a cystotome and capsulorrhexis forceps.  Balanced salt solution was used to hydrodissect and hydrodelineate the nucleus.   Phacoemulsification was then used in stop and chop fashion to remove the lens nucleus and epinucleus.  The remaining cortex was then removed using the irrigation and aspiration handpiece. Provisc was then placed into the capsular bag to distend it for lens placement.  A lens was then injected into the capsular bag.  The  remaining viscoelastic was aspirated.   Wounds were hydrated with balanced salt solution.  The anterior chamber was inflated to a physiologic pressure with balanced salt solution.  No wound leaks were noted. Cefuroxime 0.1 ml of a 10mg /ml solution was injected into the anterior chamber for a dose of 1 mg of intracameral antibiotic at the completion of the case.   Timolol and Brimonidine drops were applied to the eye.  The patient was taken to the recovery room in stable condition without complications of anesthesia or surgery.   Jhovany Weidinger 02/03/2023, 10:30 AM

## 2023-02-03 NOTE — Transfer of Care (Signed)
Immediate Anesthesia Transfer of Care Note  Patient: Miranda Moore  Procedure(s) Performed: CATARACT EXTRACTION PHACO AND INTRAOCULAR LENS PLACEMENT (IOC) RIGHT DIABETIC 2.95 00:24.4 (Right)  Patient Location: PACU  Anesthesia Type: MAC  Level of Consciousness: awake, alert  and patient cooperative  Airway and Oxygen Therapy: Patient Spontanous Breathing and Patient connected to supplemental oxygen  Post-op Assessment: Post-op Vital signs reviewed, Patient's Cardiovascular Status Stable, Respiratory Function Stable, Patent Airway and No signs of Nausea or vomiting  Post-op Vital Signs: Reviewed and stable  Complications: No notable events documented.

## 2023-02-03 NOTE — Anesthesia Preprocedure Evaluation (Signed)
Anesthesia Evaluation  Patient identified by MRN, date of birth, ID band Patient awake    Reviewed: Allergy & Precautions, H&P , NPO status , Patient's Chart, lab work & pertinent test results  Airway Mallampati: III  TM Distance: >3 FB Neck ROM: Full    Dental no notable dental hx. (+) Caps   Pulmonary neg pulmonary ROS, sleep apnea    Pulmonary exam normal breath sounds clear to auscultation       Cardiovascular hypertension, negative cardio ROS Normal cardiovascular exam+ dysrhythmias + Valvular Problems/Murmurs  Rhythm:Regular Rate:Normal     Neuro/Psych  PSYCHIATRIC DISORDERS Anxiety Depression    negative neurological ROS  negative psych ROS   GI/Hepatic negative GI ROS, Neg liver ROS,GERD  ,,  Endo/Other  negative endocrine ROSdiabetes    Renal/GU negative Renal ROS  negative genitourinary   Musculoskeletal negative musculoskeletal ROS (+)    Abdominal   Peds negative pediatric ROS (+)  Hematology negative hematology ROS (+)   Anesthesia Other Findings   Reproductive/Obstetrics negative OB ROS                              Anesthesia Physical Anesthesia Plan  ASA: 3  Anesthesia Plan: MAC   Post-op Pain Management:    Induction: Intravenous  PONV Risk Score and Plan:   Airway Management Planned: Natural Airway and Nasal Cannula  Additional Equipment:   Intra-op Plan:   Post-operative Plan:   Informed Consent: I have reviewed the patients History and Physical, chart, labs and discussed the procedure including the risks, benefits and alternatives for the proposed anesthesia with the patient or authorized representative who has indicated his/her understanding and acceptance.     Dental Advisory Given  Plan Discussed with: Anesthesiologist, CRNA and Surgeon  Anesthesia Plan Comments: (Patient consented for risks of anesthesia including but not limited to:  - adverse  reactions to medications - damage to eyes, teeth, lips or other oral mucosa - nerve damage due to positioning  - sore throat or hoarseness - Damage to heart, brain, nerves, lungs, other parts of body or loss of life  Patient voiced understanding.)         Anesthesia Quick Evaluation

## 2023-02-03 NOTE — Anesthesia Preprocedure Evaluation (Addendum)
Anesthesia Evaluation  Patient identified by MRN, date of birth, ID band Patient awake    Reviewed: Allergy & Precautions, H&P , NPO status , Patient's Chart, lab work & pertinent test results  Airway Mallampati: III  TM Distance: >3 FB Neck ROM: Full    Dental no notable dental hx. (+) Caps   Pulmonary sleep apnea    Pulmonary exam normal breath sounds clear to auscultation       Cardiovascular hypertension, Normal cardiovascular exam+ dysrhythmias + Valvular Problems/Murmurs  Rhythm:Regular Rate:Normal  Hx murmur, can't find echo or note on this   Neuro/Psych  PSYCHIATRIC DISORDERS Anxiety Depression    negative neurological ROS  negative psych ROS   GI/Hepatic Neg liver ROS,GERD  ,,  Endo/Other  negative endocrine ROSdiabetes    Renal/GU negative Renal ROS  negative genitourinary   Musculoskeletal negative musculoskeletal ROS (+)    Abdominal   Peds negative pediatric ROS (+)  Hematology negative hematology ROS (+)   Anesthesia Other Findings Hypertension  Sleep apnea Pre-diabetes  GERD (gastroesophageal reflux disease) Anxiety  Depression MRSA infection  Heart murmur Gout  Previous cataract surgery 02-03-23   Reproductive/Obstetrics negative OB ROS                             Anesthesia Physical Anesthesia Plan  ASA: 3  Anesthesia Plan: MAC   Post-op Pain Management:    Induction: Intravenous  PONV Risk Score and Plan:   Airway Management Planned: Natural Airway and Nasal Cannula  Additional Equipment:   Intra-op Plan:   Post-operative Plan:   Informed Consent: I have reviewed the patients History and Physical, chart, labs and discussed the procedure including the risks, benefits and alternatives for the proposed anesthesia with the patient or authorized representative who has indicated his/her understanding and acceptance.     Dental Advisory Given  Plan  Discussed with: Anesthesiologist, CRNA and Surgeon  Anesthesia Plan Comments: (Patient consented for risks of anesthesia including but not limited to:  - adverse reactions to medications - damage to eyes, teeth, lips or other oral mucosa - nerve damage due to positioning  - sore throat or hoarseness - Damage to heart, brain, nerves, lungs, other parts of body or loss of life  Patient voiced understanding.)       Anesthesia Quick Evaluation

## 2023-02-03 NOTE — Anesthesia Postprocedure Evaluation (Signed)
Anesthesia Post Note  Patient: AISIA CORREIRA  Procedure(s) Performed: CATARACT EXTRACTION PHACO AND INTRAOCULAR LENS PLACEMENT (IOC) RIGHT DIABETIC 2.95 00:24.4 (Right)  Patient location during evaluation: PACU Anesthesia Type: MAC Level of consciousness: awake and alert Pain management: pain level controlled Vital Signs Assessment: post-procedure vital signs reviewed and stable Respiratory status: spontaneous breathing, nonlabored ventilation, respiratory function stable and patient connected to nasal cannula oxygen Cardiovascular status: stable and blood pressure returned to baseline Postop Assessment: no apparent nausea or vomiting Anesthetic complications: no   No notable events documented.   Last Vitals:  Vitals:   02/03/23 1030 02/03/23 1037  BP: (!) 140/71 128/76  Pulse: (!) 57 (!) 58  Resp: 18 13  Temp: (!) 36.4 C (!) 36.4 C  SpO2: 98% 99%    Last Pain:  Vitals:   02/03/23 1037  TempSrc:   PainSc: 0-No pain                 Sloane Junkin C Hazael Olveda

## 2023-02-04 ENCOUNTER — Encounter: Payer: Self-pay | Admitting: Ophthalmology

## 2023-02-13 ENCOUNTER — Other Ambulatory Visit: Payer: Self-pay | Admitting: Physician Assistant

## 2023-02-13 DIAGNOSIS — F418 Other specified anxiety disorders: Secondary | ICD-10-CM

## 2023-02-15 NOTE — Discharge Instructions (Signed)

## 2023-02-17 ENCOUNTER — Other Ambulatory Visit: Payer: Self-pay

## 2023-02-17 ENCOUNTER — Ambulatory Visit: Payer: BC Managed Care – PPO | Admitting: Anesthesiology

## 2023-02-17 ENCOUNTER — Encounter
Admission: RE | Disposition: A | Discharge status: Home / Self Care | Payer: Self-pay | Source: Home / Self Care | Attending: Ophthalmology

## 2023-02-17 ENCOUNTER — Ambulatory Visit
Admission: RE | Admit: 2023-02-17 | Discharge: 2023-02-17 | Disposition: A | Discharge status: Home / Self Care | Payer: BC Managed Care – PPO | Attending: Ophthalmology | Admitting: Ophthalmology

## 2023-02-17 DIAGNOSIS — G4730 Sleep apnea, unspecified: Secondary | ICD-10-CM | POA: Diagnosis not present

## 2023-02-17 DIAGNOSIS — I10 Essential (primary) hypertension: Secondary | ICD-10-CM | POA: Insufficient documentation

## 2023-02-17 DIAGNOSIS — F419 Anxiety disorder, unspecified: Secondary | ICD-10-CM | POA: Diagnosis not present

## 2023-02-17 DIAGNOSIS — M109 Gout, unspecified: Secondary | ICD-10-CM | POA: Diagnosis not present

## 2023-02-17 DIAGNOSIS — F32A Depression, unspecified: Secondary | ICD-10-CM | POA: Insufficient documentation

## 2023-02-17 DIAGNOSIS — Z9841 Cataract extraction status, right eye: Secondary | ICD-10-CM | POA: Diagnosis not present

## 2023-02-17 DIAGNOSIS — H2512 Age-related nuclear cataract, left eye: Secondary | ICD-10-CM | POA: Insufficient documentation

## 2023-02-17 DIAGNOSIS — K219 Gastro-esophageal reflux disease without esophagitis: Secondary | ICD-10-CM | POA: Diagnosis not present

## 2023-02-17 DIAGNOSIS — Z961 Presence of intraocular lens: Secondary | ICD-10-CM | POA: Insufficient documentation

## 2023-02-17 HISTORY — PX: CATARACT EXTRACTION W/PHACO: SHX586

## 2023-02-17 SURGERY — PHACOEMULSIFICATION, CATARACT, WITH IOL INSERTION
Anesthesia: Monitor Anesthesia Care | Site: Eye | Laterality: Left

## 2023-02-17 MED ORDER — FENTANYL CITRATE (PF) 100 MCG/2ML IJ SOLN
INTRAMUSCULAR | Status: DC | PRN
  Administered 2023-02-17: 50 ug via INTRAVENOUS

## 2023-02-17 MED ORDER — SIGHTPATH DOSE#1 NA HYALUR & NA CHOND-NA HYALUR IO KIT
PACK | INTRAOCULAR | Status: DC | PRN
  Administered 2023-02-17: 1 via OPHTHALMIC

## 2023-02-17 MED ORDER — TETRACAINE HCL 0.5 % OP SOLN
OPHTHALMIC | Status: DC | PRN
  Administered 2023-02-17: 1 [drp] via OPHTHALMIC

## 2023-02-17 MED ORDER — MIDAZOLAM HCL 2 MG/2ML IJ SOLN
INTRAMUSCULAR | Status: DC | PRN
  Administered 2023-02-17: 1.5 mg via INTRAVENOUS
  Administered 2023-02-17: .5 mg via INTRAVENOUS

## 2023-02-17 MED ORDER — DEXMEDETOMIDINE HCL IN NACL 200 MCG/50ML IV SOLN
INTRAVENOUS | Status: DC | PRN
  Administered 2023-02-17: 4 ug via INTRAVENOUS

## 2023-02-17 MED ORDER — SIGHTPATH DOSE#1 BSS IO SOLN
INTRAOCULAR | Status: DC | PRN
  Administered 2023-02-17: 51 mL via OPHTHALMIC

## 2023-02-17 MED ORDER — SIGHTPATH DOSE#1 BSS IO SOLN
INTRAOCULAR | Status: DC | PRN
  Administered 2023-02-17: 15 mL via INTRAOCULAR

## 2023-02-17 MED ORDER — SIGHTPATH DOSE#1 BSS IO SOLN
INTRAOCULAR | Status: DC | PRN
  Administered 2023-02-17: 2 mL via INTRAMUSCULAR

## 2023-02-17 MED ORDER — BRIMONIDINE TARTRATE-TIMOLOL 0.2-0.5 % OP SOLN
OPHTHALMIC | Status: DC | PRN
  Administered 2023-02-17: 1 [drp] via OPHTHALMIC

## 2023-02-17 MED ORDER — TETRACAINE HCL 0.5 % OP SOLN
1.0000 [drp] | OPHTHALMIC | Status: DC | PRN
  Administered 2023-02-17 (×3): 1 [drp] via OPHTHALMIC

## 2023-02-17 MED ORDER — ARMC OPHTHALMIC DILATING DROPS
1.0000 | OPHTHALMIC | Status: DC | PRN
  Administered 2023-02-17 (×3): 1 via OPHTHALMIC

## 2023-02-17 MED ORDER — LACTATED RINGERS IV SOLN: INTRAVENOUS | Status: DC

## 2023-02-17 MED ORDER — SODIUM CHLORIDE 0.9% FLUSH
INTRAVENOUS | Status: DC | PRN
  Administered 2023-02-17: 10 mL via INTRAVENOUS

## 2023-02-17 MED ORDER — CEFUROXIME OPHTHALMIC INJECTION 1 MG/0.1 ML
INJECTION | OPHTHALMIC | Status: DC | PRN
  Administered 2023-02-17: .1 mL via INTRACAMERAL

## 2023-02-17 SURGICAL SUPPLY — 11 items
CANNULA ANT/CHMB 27G (MISCELLANEOUS) IMPLANT
CANNULA ANT/CHMB 27GA (MISCELLANEOUS) IMPLANT
CATARACT SUITE SIGHTPATH (MISCELLANEOUS) ×1 IMPLANT
FEE CATARACT SUITE SIGHTPATH (MISCELLANEOUS) ×1 IMPLANT
GLOVE SRG 8 PF TXTR STRL LF DI (GLOVE) ×1 IMPLANT
GLOVE SURG ENC TEXT LTX SZ7.5 (GLOVE) ×1 IMPLANT
GLOVE SURG UNDER POLY LF SZ8 (GLOVE) ×1
LENS IOL TECNIS EYHANCE 23.5 (Intraocular Lens) IMPLANT
NDL FILTER BLUNT 18X1 1/2 (NEEDLE) ×1 IMPLANT
NEEDLE FILTER BLUNT 18X1 1/2 (NEEDLE) ×1 IMPLANT
SYR 3ML LL SCALE MARK (SYRINGE) ×1 IMPLANT

## 2023-02-17 NOTE — Transfer of Care (Signed)
Immediate Anesthesia Transfer of Care Note  Patient: Miranda Moore  Procedure(s) Performed: CATARACT EXTRACTION PHACO AND INTRAOCULAR LENS PLACEMENT (IOC) LEFT DIABETIC 4.37 00:32.1 (Left: Eye)  Patient Location: PACU  Anesthesia Type:MAC  Level of Consciousness: awake, alert , and oriented  Airway & Oxygen Therapy: Patient Spontanous Breathing  Post-op Assessment: Report given to RN and Post -op Vital signs reviewed and stable  Post vital signs: Reviewed and stable  Last Vitals:  Vitals Value Taken Time  BP 134/68 02/17/23 0826  Temp 36.8 C 02/17/23 0826  Pulse 58 02/17/23 0828  Resp 18 02/17/23 0828  SpO2 96 % 02/17/23 0828  Vitals shown include unvalidated device data.  Last Pain:  Vitals:   02/17/23 0655  TempSrc: Temporal  PainSc: 3          Complications: No notable events documented.

## 2023-02-17 NOTE — Anesthesia Postprocedure Evaluation (Signed)
Anesthesia Post Note  Patient: Miranda Moore  Procedure(s) Performed: CATARACT EXTRACTION PHACO AND INTRAOCULAR LENS PLACEMENT (IOC) LEFT DIABETIC 4.37 00:32.1 (Left: Eye)  Patient location during evaluation: PACU Anesthesia Type: MAC Level of consciousness: awake and alert Pain management: pain level controlled Vital Signs Assessment: post-procedure vital signs reviewed and stable Respiratory status: spontaneous breathing, nonlabored ventilation, respiratory function stable and patient connected to nasal cannula oxygen Cardiovascular status: stable and blood pressure returned to baseline Postop Assessment: no apparent nausea or vomiting Anesthetic complications: no   No notable events documented.   Last Vitals:  Vitals:   02/17/23 0826 02/17/23 0832  BP:  91/70  Pulse: 62 60  Resp:  15  Temp: 36.8 C   SpO2: 94% 97%    Last Pain:  Vitals:   02/17/23 0832  TempSrc:   PainSc: 0-No pain                 Marisue Humble

## 2023-02-17 NOTE — H&P (Signed)
[facility]   Primary Care Physician:  Malva Limes, MD Ophthalmologist: Dr. Lockie Mola  Pre-Procedure History & Physical: HPI:  Miranda Moore is a 62 y.o. female here for ophthalmic surgery.   Past Medical History:  Diagnosis Date   Anxiety    Depression    GERD (gastroesophageal reflux disease)    Heart murmur    Hypertension    MRSA infection 2007   left lower leg   Pre-diabetes    Sleep apnea    uses CPAP nightly    Past Surgical History:  Procedure Laterality Date   ABDOMINAL HYSTERECTOMY  2002   APPENDECTOMY  2013   laparascopic with removal of tubal remnant   BREAST BIOPSY Right 10/14/2022   Right Breast Stereo Bx X Clip- path pending   BREAST BIOPSY Right 10/14/2022   Right Breast Stereo Bx Coil Clip path pending   BREAST BIOPSY Right 10/14/2022   MM RT BREAST BX W LOC DEV 1ST LESION IMAGE BX SPEC STEREO GUIDE 10/14/2022 [facility]   BREAST BIOPSY Right 10/14/2022   MM RT BREAST BX W LOC DEV EA AD LESION IMG BX SPEC STEREO GUIDE 10/14/2022 [facility]   CATARACT EXTRACTION W/PHACO Right 02/03/2023   Procedure: CATARACT EXTRACTION PHACO AND INTRAOCULAR LENS PLACEMENT (IOC) RIGHT DIABETIC 2.95 00:24.4;  Surgeon: Lockie Mola, MD;  Location: [facility];  Service: Ophthalmology;  Laterality: Right;   COLONOSCOPY  2015   COLONOSCOPY WITH PROPOFOL N/A 06/04/2020   Procedure: COLONOSCOPY WITH BIOPSY;  Surgeon: Pasty Spillers, MD;  Location: [facility];  Service: Endoscopy;  Laterality: N/A;   KNEE SURGERY  2012   LAPAROSCOPIC OOPHORECTOMY  2012   POLYPECTOMY N/A 06/04/2020   Procedure: POLYPECTOMY;  Surgeon: Pasty Spillers, MD;  Location: [facility];  Service: Endoscopy;  Laterality: N/A;   WRIST ARTHROSCOPY WITH DEBRIDEMENT Right 08/30/2018   Procedure: WRIST ARTHROSCOPY WITH DEBRIDEMENT AND SYNOVECTOMY;  Surgeon: Dominica Severin, MD;  Location: [facility];  Service: Orthopedics;   Laterality: Right;   WRIST SURGERY  02/11/2021    Prior to Admission medications   Medication Sig Start Date End Date Taking? Authorizing Provider  allopurinol (ZYLOPRIM) 100 MG tablet TAKE 1 TABLET BY MOUTH EVERY DAY 05/18/22  Yes Drubel, Lillia Abed, PA-C  ALPRAZolam (XANAX) 0.5 MG tablet TAKE 1/2 - 1 TABLET (0.25 - 0.5 MG TOTAL) BY MOUTH TWICE A DAY AS NEEDED FOR ANXIETY 09/22/22  Yes Drubel, Lillia Abed, PA-C  amLODipine (NORVASC) 10 MG tablet TAKE 1 TABLET DAILY FOR    BLOOD PRESSURE CONTROL 12/01/22  Yes Alfredia Ferguson, PA-C  aspirin 81 MG tablet Take 81 mg by mouth daily.    Yes [provider]  atorvastatin (LIPITOR) 10 MG tablet TAKE 1 TABLET DAILY 08/18/22  Yes Alfredia Ferguson, PA-C  Calcium Carbonate-Vitamin D 600-200 MG-UNIT TABS  06/11/05  Yes [provider]  Cholecalciferol (VITAMIN D-3) 1000 UNITS CAPS Take by mouth daily.   Yes [provider]  colchicine 0.6 MG tablet FOR ACUTE GOUT, TAKE TWO TABLETS ON DAY 1 AND THEN ONE HOUR LATER, TAKE A THIRD TABLET TO TOTAL 1.8 MG DAILY. FOR PREVENTION, TAKE ONE 0.6 MG TABLET DAILY WITH ALLOPURINOL. Patient taking differently: as needed. For ACUTE gout, take two tablets on day 1 and then one hour later, take a third tablet to total 1.8 mg daily. For prevention, take one 0.6 mg tablet daily with allopurinol. 06/12/20  Yes Chrismon, Jodell Cipro, PA-C  furosemide (LASIX) 20 MG tablet TAKE  1 TABLET (20 MG TOTAL) BY MOUTH DAILY AS NEEDED FOR EDEMA. 12/01/22  Yes Alfredia Ferguson, PA-C  hydrochlorothiazide (HYDRODIURIL) 12.5 MG tablet TAKE 1 TABLET DAILY FOR    BLOOD PRESSURE CONTROL 01/11/23  Yes Drubel, Lillia Abed, PA-C  ketorolac (TORADOL) 10 MG tablet Take 1 tablet (10 mg total) by mouth every 6 (six) hours as needed. 12/25/22  Yes Drubel, Lillia Abed, PA-C  methocarbamol (ROBAXIN) 500 MG tablet Take 1,000 mg by mouth 2 (two) times daily. 11/10/22  Yes [provider]  Multiple Vitamins-Minerals (WOMENS MULTIVITAMIN PO) Take by mouth.    Yes [provider]  Omega-3 Fatty Acids (FISH OIL DOUBLE STRENGTH) 1200 MG CAPS Take by mouth. 12/09/10  Yes [provider]  omeprazole (PRILOSEC) 40 MG capsule TAKE 1 CAPSULE DAILY FOR   REFLUX ESOPHAGITIS WITH    GASTRITIS 07/01/22  Yes Drubel, Lillia Abed, PA-C  POTASSIUM CITRATE PO Take by mouth daily.   Yes [provider]  sertraline (ZOLOFT) 100 MG tablet TAKE 1 AND 1/2 TABLETS     DAILY 02/15/23  Yes Drubel, Lillia Abed, PA-C  valsartan (DIOVAN) 160 MG tablet TAKE 1 TABLET DAILY FOR BLOOD PRESSURE CONTROL 01/11/23  Yes Drubel, Lillia Abed, PA-C  Aspirin Buf,CaCarb-MgCarb-MgO, 81 MG TABS     [provider]  oxybutynin (DITROPAN-XL) 10 MG 24 hr tablet Take 10 mg by mouth daily. 04/11/22   [provider]    Allergies as of 12/02/2022 - Review Complete 11/11/2022  Allergen Reaction Noted   Chlorpheniramine-phenylephrine Hives 05/31/2015   Elemental sulfur Other (See Comments) 11/29/2014   Other Hives 11/29/2014   Sulfa antibiotics Other (See Comments) 05/31/2015   Sulfur Other (See Comments) 11/29/2014   Triprolidine-pseudoephedrine Other (See Comments) 11/11/2022    Family History  Problem Relation Age of Onset   Cancer Mother        colon   Heart failure Mother    Heart disease Father    Hypertension Father    Hypertension Brother    Heart failure Paternal Grandmother    Dementia Paternal Grandfather    Breast cancer Paternal Aunt     Social History   Socioeconomic History   Marital status: Married    Spouse name: Not on file   Number of children: Not on file   Years of education: Not on file   Highest education level: Not on file  Occupational History   Not on file  Tobacco Use   Smoking status: Never   Smokeless tobacco: Never  Substance and Sexual Activity   Alcohol use: Yes    Alcohol/week: 0.0 standard drinks of alcohol    Comment: ocassionally   Drug use: No   Sexual activity: Not on file  Other Topics Concern   Not on file   Social History Narrative   Not on file   Social Determinants of Health   Financial Resource Strain: Not on file  Food Insecurity: Not on file  Transportation Needs: Not on file  Physical Activity: Not on file  Stress: Not on file  Social Connections: Not on file  Intimate Partner Violence: Not on file    Review of Systems: See HPI, otherwise negative ROS  Physical Exam: BP 126/85   Pulse 69   Temp 98.3 F (36.8 C) (Temporal)   Resp (!) 21   Ht 5\' 9"  (1.753 m)   Wt 114.3 kg   SpO2 96%   BMI 37.21 kg/m  General:   Alert,  pleasant and cooperative in NAD Head:  Normocephalic and  atraumatic. Lungs:  Clear to auscultation.    Heart:  Regular rate and rhythm.   Impression/Plan: Miranda Moore is here for ophthalmic surgery.  Risks, benefits, limitations, and alternatives regarding ophthalmic surgery have been reviewed with the patient.  Questions have been answered.  All parties agreeable.   Lockie Mola, MD  02/17/2023, 7:32 AM

## 2023-02-17 NOTE — Op Note (Signed)
OPERATIVE NOTE  Miranda Moore [id number] 02/17/2023   PREOPERATIVE DIAGNOSIS:  Nuclear sclerotic cataract left eye. H25.12   POSTOPERATIVE DIAGNOSIS:    Nuclear sclerotic cataract left eye.     PROCEDURE:  Phacoemusification with posterior chamber intraocular lens placement of the left eye  Ultrasound time: Procedure(s): CATARACT EXTRACTION PHACO AND INTRAOCULAR LENS PLACEMENT (IOC) LEFT DIABETIC 4.37 00:32.1 (Left)  LENS:   Implant Name Type Inv. Item Serial No. Manufacturer Lot No. LRB No. Used Action  LENS IOL TECNIS EYHANCE 23.5 - [id number] Intraocular Lens LENS IOL TECNIS EYHANCE 23.5 [id number] [employer]  Left 1 Implanted      SURGEON:  Deirdre Evener, MD   ANESTHESIA:  Topical with tetracaine drops and 2% Xylocaine jelly, augmented with 1% preservative-free intracameral lidocaine.    COMPLICATIONS:  None.   DESCRIPTION OF PROCEDURE:  The patient was identified in the holding room and transported to the operating room and placed in the supine position under the operating microscope.  The left eye was identified as the operative eye and it was prepped and draped in the usual sterile ophthalmic fashion.   A 1 millimeter clear-corneal paracentesis was made at the 1:30 position.  0.5 ml of preservative-free 1% lidocaine was injected into the anterior chamber.  The anterior chamber was filled with Viscoat viscoelastic.  A 2.4 millimeter keratome was used to make a near-clear corneal incision at the 10:30 position.  .  A curvilinear capsulorrhexis was made with a cystotome and capsulorrhexis forceps.  Balanced salt solution was used to hydrodissect and hydrodelineate the nucleus.   Phacoemulsification was then used in stop and chop fashion to remove the lens nucleus and epinucleus.  The remaining cortex was then removed using the irrigation and aspiration handpiece. Provisc was then placed into the capsular bag to distend it for lens placement.  A lens was then injected into  the capsular bag.  The remaining viscoelastic was aspirated.   Wounds were hydrated with balanced salt solution.  The anterior chamber was inflated to a physiologic pressure with balanced salt solution.  No wound leaks were noted. Cefuroxime 0.1 ml of a 10mg /ml solution was injected into the anterior chamber for a dose of 1 mg of intracameral antibiotic at the completion of the case.   Timolol and Brimonidine drops were applied to the eye.  The patient was taken to the recovery room in stable condition without complications of anesthesia or surgery.  Algie Westry 02/17/2023, 8:24 AM

## 2023-02-18 ENCOUNTER — Encounter: Payer: Self-pay | Admitting: Ophthalmology

## 2023-03-11 ENCOUNTER — Telehealth: Payer: Self-pay | Admitting: Urology

## 2023-03-11 NOTE — Telephone Encounter (Signed)
Patient called to request refill of Oxybutynin. She was last seen on 09/08/21. She scheduled an appt for 05/03/23 with Dr. Sherron Monday. She asked if she can get refill to last until her appointment.

## 2023-03-15 ENCOUNTER — Other Ambulatory Visit: Payer: Self-pay | Admitting: Family Medicine

## 2023-03-15 ENCOUNTER — Other Ambulatory Visit: Payer: Self-pay

## 2023-03-15 MED ORDER — METHOCARBAMOL 500 MG PO TABS
1000.0000 mg | ORAL_TABLET | Freq: Four times a day (QID) | ORAL | 1 refills | Status: DC | PRN
  Filled 2023-03-15 – 2023-03-29 (×2): qty 40, 5d supply, fill #0

## 2023-03-22 ENCOUNTER — Encounter: Payer: Self-pay | Admitting: Family Medicine

## 2023-03-26 ENCOUNTER — Other Ambulatory Visit: Payer: Self-pay

## 2023-03-29 ENCOUNTER — Other Ambulatory Visit: Payer: Self-pay

## 2023-03-29 ENCOUNTER — Ambulatory Visit (INDEPENDENT_AMBULATORY_CARE_PROVIDER_SITE_OTHER): Payer: BC Managed Care – PPO | Admitting: Urology

## 2023-03-29 ENCOUNTER — Encounter: Payer: Self-pay | Admitting: Urology

## 2023-03-29 VITALS — BP 146/75 | HR 98 | Ht 69.0 in | Wt 248.0 lb

## 2023-03-29 DIAGNOSIS — N393 Stress incontinence (female) (male): Secondary | ICD-10-CM

## 2023-03-29 DIAGNOSIS — N3946 Mixed incontinence: Secondary | ICD-10-CM

## 2023-03-29 LAB — MICROSCOPIC EXAMINATION: Epithelial Cells (non renal): >10 /hpf — AB (ref 0–10)

## 2023-03-29 LAB — URINALYSIS, COMPLETE
Bilirubin, UA: NEGATIVE
Glucose, UA: NEGATIVE
Ketones, UA: NEGATIVE
Leukocytes,UA: NEGATIVE
Nitrite, UA: NEGATIVE
Protein,UA: NEGATIVE
RBC, UA: NEGATIVE
Specific Gravity, UA: >1.03 — ABNORMAL HIGH (ref 1.005–1.030)
Urobilinogen, Ur: 0.2 mg/dL (ref 0.2–1.0)
pH, UA: 5.5 (ref 5.0–7.5)

## 2023-03-29 MED ORDER — OXYBUTYNIN CHLORIDE ER 10 MG PO TB24: 10.0000 mg | ORAL_TABLET | Freq: Every day | ORAL | 3 refills | Status: DC

## 2023-03-29 NOTE — Progress Notes (Signed)
03/29/2023 9:38 AM   Miranda Moore 1961/05/18 [id number]  Referring provider: Malva Limes, MD [address] [place],  [place] [address]  Chief Complaint  Patient presents with   Follow-up   Urinary Incontinence    HPI: I was consulted to assess the patient's urinary incontinence. She can leak with coughing sneezing and a hard laugh. She does not leak with bending or lifting. Sometimes she has urge incontinence. No bedwetting. Wears 1-2 pads a day that are damp. I think the urge component is more severe since she does not cough a lot. She says when she coughs she loses a small amount she is a Chief Strategy Officer at [employer]  She voids every 3 hours but more frequently in the morning after diuretic. Sometimes he gets up once at night. She was prescribed Kegel exercises. Has had a hysterectomy  Patient has mild mixed incontinence.  Role of urodynamics and physical therapy discussed.  We decide to try to help her with medical therapy.  If this fails we can order urodynamics.  Reassess in 6 weeks on Myrbetriq 50 mg samples and prescription.  She declined physical therapy.  We will order test in future if needed depending on goals   Today Frequency stable.  Urgency incontinence much better on oxybutynin.  Failed Myrbetriq.  Clinically not infected      PMH: Past Medical History:  Diagnosis Date   Anxiety    Depression    GERD (gastroesophageal reflux disease)    Heart murmur    Hypertension    MRSA infection 2007   left lower leg   Pre-diabetes    Sleep apnea    uses CPAP nightly    Surgical History: Past Surgical History:  Procedure Laterality Date   ABDOMINAL HYSTERECTOMY  2002   APPENDECTOMY  2013   laparascopic with removal of tubal remnant   BREAST BIOPSY Right 10/14/2022   Right Breast Stereo Bx X Clip- path pending   BREAST BIOPSY Right 10/14/2022   Right Breast Stereo Bx Coil Clip path pending   BREAST BIOPSY Right 10/14/2022   MM RT BREAST BX W  LOC DEV 1ST LESION IMAGE BX SPEC STEREO GUIDE 10/14/2022 [facility]   BREAST BIOPSY Right 10/14/2022   MM RT BREAST BX W LOC DEV EA AD LESION IMG BX SPEC STEREO GUIDE 10/14/2022 [facility]   CATARACT EXTRACTION W/PHACO Right 02/03/2023   Procedure: CATARACT EXTRACTION PHACO AND INTRAOCULAR LENS PLACEMENT (IOC) RIGHT DIABETIC 2.95 00:24.4;  Surgeon: Lockie Mola, MD;  Location: [facility];  Service: Ophthalmology;  Laterality: Right;   CATARACT EXTRACTION W/PHACO Left 02/17/2023   Procedure: CATARACT EXTRACTION PHACO AND INTRAOCULAR LENS PLACEMENT (IOC) LEFT DIABETIC 4.37 00:32.1;  Surgeon: Lockie Mola, MD;  Location: [facility];  Service: Ophthalmology;  Laterality: Left;   COLONOSCOPY  2015   COLONOSCOPY WITH PROPOFOL N/A 06/04/2020   Procedure: COLONOSCOPY WITH BIOPSY;  Surgeon: Pasty Spillers, MD;  Location: [facility];  Service: Endoscopy;  Laterality: N/A;   KNEE SURGERY  2012   LAPAROSCOPIC OOPHORECTOMY  2012   POLYPECTOMY N/A 06/04/2020   Procedure: POLYPECTOMY;  Surgeon: Pasty Spillers, MD;  Location: [facility];  Service: Endoscopy;  Laterality: N/A;   WRIST ARTHROSCOPY WITH DEBRIDEMENT Right 08/30/2018   Procedure: WRIST ARTHROSCOPY WITH DEBRIDEMENT AND SYNOVECTOMY;  Surgeon: Dominica Severin, MD;  Location: [facility];  Service: Orthopedics;  Laterality: Right;   WRIST SURGERY  02/11/2021    Home Medications:  Allergies  as of 03/29/2023       Reactions   Chlorpheniramine-phenylephrine Hives   Elemental Sulfur Other (See Comments)   Low b/p   Other Hives   actifed   Sulfa Antibiotics Other (See Comments)   Sulfur Other (See Comments)   Triprolidine-pseudoephedrine Other (See Comments)        Medication List        Accurate as of March 29, 2023  9:38 AM. If you have any questions, ask your nurse or doctor.          STOP taking these medications    Aspirin Buf(CaCarb-MgCarb-MgO)  81 MG Tabs Stopped by: Lorin Picket A Tarae Wooden       TAKE these medications    allopurinol 100 MG tablet Commonly known as: ZYLOPRIM TAKE 1 TABLET BY MOUTH EVERY DAY   ALPRAZolam 0.5 MG tablet Commonly known as: XANAX TAKE 1/2 - 1 TABLET (0.25 - 0.5 MG TOTAL) BY MOUTH TWICE A DAY AS NEEDED FOR ANXIETY   amLODipine 10 MG tablet Commonly known as: NORVASC TAKE 1 TABLET DAILY FOR    BLOOD PRESSURE CONTROL   aspirin 81 MG tablet Take 81 mg by mouth daily.   atorvastatin 10 MG tablet Commonly known as: LIPITOR TAKE 1 TABLET DAILY   Calcium Carbonate-Vitamin D 600-200 MG-UNIT Tabs   colchicine 0.6 MG tablet FOR ACUTE GOUT, TAKE TWO TABLETS ON DAY 1 AND THEN ONE HOUR LATER, TAKE A THIRD TABLET TO TOTAL 1.8 MG DAILY. FOR PREVENTION, TAKE ONE 0.6 MG TABLET DAILY WITH ALLOPURINOL. What changed: See the new instructions.   Fish Oil Double Strength 1200 MG Caps Take by mouth.   furosemide 20 MG tablet Commonly known as: LASIX TAKE 1 TABLET (20 MG TOTAL) BY MOUTH DAILY AS NEEDED FOR EDEMA.   hydrochlorothiazide 12.5 MG tablet Commonly known as: HYDRODIURIL TAKE 1 TABLET DAILY FOR    BLOOD PRESSURE CONTROL   ketorolac 10 MG tablet Commonly known as: TORADOL Take 1 tablet (10 mg total) by mouth every 6 (six) hours as needed.   methocarbamol 500 MG tablet Commonly known as: ROBAXIN Take 2 tablets (1,000 mg total) by mouth every 6 (six) hours as needed for muscle spasms.   omeprazole 40 MG capsule Commonly known as: PRILOSEC TAKE 1 CAPSULE DAILY FOR   REFLUX ESOPHAGITIS WITH    GASTRITIS   oxybutynin 10 MG 24 hr tablet Commonly known as: DITROPAN-XL Take 10 mg by mouth daily.   POTASSIUM CITRATE PO Take by mouth daily.   sertraline 100 MG tablet Commonly known as: ZOLOFT TAKE 1 AND 1/2 TABLETS     DAILY   valsartan 160 MG tablet Commonly known as: DIOVAN TAKE 1 TABLET DAILY FOR BLOOD PRESSURE CONTROL   Vitamin D-3 25 MCG (1000 UT) Caps Take by mouth daily.   WOMENS  MULTIVITAMIN PO Take by mouth.        Allergies:  Allergies  Allergen Reactions   Chlorpheniramine-Phenylephrine Hives   Elemental Sulfur Other (See Comments)    Low b/p   Other Hives    actifed   Sulfa Antibiotics Other (See Comments)   Sulfur Other (See Comments)   Triprolidine-Pseudoephedrine Other (See Comments)    Family History: Family History  Problem Relation Age of Onset   Cancer Mother        colon   Heart failure Mother    Heart disease Father    Hypertension Father    Hypertension Brother    Heart failure Paternal Grandmother    Dementia  Paternal Grandfather    Breast cancer Paternal Aunt     Social History:  reports that she has never smoked. She has never been exposed to tobacco smoke. She has never used smokeless tobacco. She reports current alcohol use. She reports that she does not use drugs.  ROS:                                        Physical Exam: BP (!) 146/75   Pulse 98   Ht 5\' 9"  (1.753 m)   Wt 112.5 kg   BMI 36.62 kg/m   Constitutional:  Alert and oriented, No acute distress. HEENT: [place] AT, moist mucus membranes.  Trachea midline, no masses.   Laboratory Data: Lab Results  Component Value Date   WBC 7.2 10/27/2022   HGB 11.7 10/27/2022   HCT 37.4 10/27/2022   MCV 77 (L) 10/27/2022   PLT 241 10/27/2022    Lab Results  Component Value Date   CREATININE 0.89 10/27/2022    No results found for: "PSA"  No results found for: "TESTOSTERONE"  Lab Results  Component Value Date   HGBA1C 6.7 (H) 10/27/2022    Urinalysis    Component Value Date/Time   BILIRUBINUR negative 03/19/2017 1446   PROTEINUR negative 03/19/2017 1446   UROBILINOGEN 0.2 03/19/2017 1446   NITRITE negative 03/19/2017 1446   LEUKOCYTESUR Negative 03/19/2017 1446    Pertinent Imaging:   Assessment & Plan: Oxybutynin ER 10 mg 90 x 3 sent to pharmacy and I will see her in 1 year  1. Stress incontinence  - Urinalysis,  Complete   No follow-ups on file.  Martina Sinner, MD  [facility] [address], [address] [place], [place] [address] [phone]

## 2023-04-06 ENCOUNTER — Ambulatory Visit: Payer: Self-pay

## 2023-04-06 NOTE — Telephone Encounter (Signed)
Message from [place] C sent at 04/06/2023 10:52 AM EDT  Summary: headache / rx concern   The patient has experienced a significant headache for roughly 3 days  The patient shares that their headache is towards the left rear of their head  The patient shares that they also feel pressure on the left side of their face  The patient has been previously prescribed ketorolac (TORADOL) 10 MG tablet [[id number]] which has been ineffective  Please contact further when possible         Chief Complaint: migraine Symptoms: located to left side and back of head, intermittent eye pain (also had recent eye surgery Frequency: Sunday  Pertinent Negatives: Patient denies fever, stiff neck, sore throat, cold symptoms  Disposition: [] ED /[] Urgent Care (no appt availability in office) / [x] Appointment(In office/virtual)/ []  [place] [facility]/ [] Home Care/ [] Refused Recommended Disposition /[] [place] [facility]/ []  Follow-up with PCP Additional Notes: can pt be worked in sooner. Scheduled tomorrow am with PCP  Reason for Disposition  [1] SEVERE headache (e.g., excruciating) AND [2] not improved after 2 hours of pain medicine  Answer Assessment - Initial Assessment Questions 1. LOCATION: "Where does it hurt?"      LEFT SIDE AND BACK  2. ONSET: "When did the headache start?" (Minutes, hours or days)      Sunday  3. PATTERN: "Does the pain come and go, or has it been constant since it started?"     Comes and goes  4. SEVERITY: "How bad is the pain?" and "What does it keep you from doing?"  (e.g., Scale 1-10; mild, moderate, or severe)   - MILD (1-3): doesn't interfere with normal activities    - MODERATE (4-7): interferes with normal activities or awakens from sleep    - SEVERE (8-10): excruciating pain, unable to do any normal activities        8/10 5. RECURRENT SYMPTOM: "Have you ever had headaches before?" If Yes, ask: "When was the last time?" and "What happened that time?"      yes 7.  MIGRAINE: "Have you been diagnosed with migraine headaches?" If Yes, ask: "Is this headache similar?"      Yes/yes 8. HEAD INJURY: "Has there been any recent injury to the head?"      no 9. OTHER SYMPTOMS: "Do you have any other symptoms?" (fever, stiff neck, eye pain, sore throat, cold symptoms)     Intermittent eye pain on prednisone  Protocols used: Headache-A-AH

## 2023-04-07 ENCOUNTER — Encounter: Payer: Self-pay | Admitting: Family Medicine

## 2023-04-07 ENCOUNTER — Ambulatory Visit: Payer: BC Managed Care – PPO | Admitting: Family Medicine

## 2023-04-07 DIAGNOSIS — R519 Headache, unspecified: Secondary | ICD-10-CM

## 2023-04-07 MED ORDER — KETOROLAC TROMETHAMINE 10 MG PO TABS: 10.0000 mg | ORAL_TABLET | Freq: Four times a day (QID) | ORAL | 0 refills | Status: DC | PRN

## 2023-04-07 MED ORDER — RIZATRIPTAN BENZOATE 10 MG PO TBDP: 10.0000 mg | ORAL_TABLET | ORAL | 1 refills | Status: DC | PRN

## 2023-04-07 NOTE — Patient Instructions (Signed)
.   Please review the attached list of medications and notify my office if there are any errors.   . Please bring all of your medications to every appointment so we can make sure that our medication list is the same as yours.   

## 2023-04-07 NOTE — Progress Notes (Signed)
Established patient visit   Patient: Miranda Moore   DOB: 1961-05-09   62 y.o. Female  MRN: [id number] Visit Date: 04/07/2023  Today's healthcare provider: Mila Merry, MD   Chief Complaint  Patient presents with   Migraine    Patient reports migraine since Sunday. She reports getting migraines every few months. Patient reports taking Toradol 10 mg as needed for migraines. Last dose was taken last night around 9pm. Patient reports left side of face feels swollen. She reports nausea on Monday. She any worsening blurred vision or light sensitivity.    Subjective    Discussed the use of AI scribe software for clinical note transcription with the patient, who gave verbal consent to proceed.  History of Present Illness   The patient, with a long-standing history of migraines, presents with an increase in headache frequency and severity. She attributes this to recent stressors, including job loss and navigating unemployment and disability systems. The patient recalls having migraines since childhood, which were initially managed with glasses. The migraines had subsided but have recently returned. Prior to March, the patient experienced infrequent migraines, which were manageable without prescription medication. However, in March, the patient had a severe migraine and was prescribed Toradol by a healthcare provider.  The patient's headaches are typically located in the back of the head and sometimes radiate to the left side. She has noticed that her face feels swollen in the mornings after using her CPAP mask, particularly when sleeping on her left side. The headaches are not monthly occurrences, but the patient had one in June and another that started a few days prior to this consultation.  The patient has been taking Toradol for her migraines, which provides some relief. She has also been using over-the-counter generic pain relief medication. The patient has a history of cataract surgery in  June and July, and is currently managing post-operative inflammation with eye drops. She also has a history of diabetes, with a recent HbA1c of 6.4.       Medications: Outpatient Medications Prior to Visit  Medication Sig   allopurinol (ZYLOPRIM) 100 MG tablet TAKE 1 TABLET BY MOUTH EVERY DAY   ALPRAZolam (XANAX) 0.5 MG tablet TAKE 1/2 - 1 TABLET (0.25 - 0.5 MG TOTAL) BY MOUTH TWICE A DAY AS NEEDED FOR ANXIETY   amLODipine (NORVASC) 10 MG tablet TAKE 1 TABLET DAILY FOR    BLOOD PRESSURE CONTROL   aspirin 81 MG tablet Take 81 mg by mouth daily.    atorvastatin (LIPITOR) 10 MG tablet TAKE 1 TABLET DAILY   Calcium Carbonate-Vitamin D 600-200 MG-UNIT TABS    Cholecalciferol (VITAMIN D-3) 1000 UNITS CAPS Take by mouth daily.   colchicine 0.6 MG tablet FOR ACUTE GOUT, TAKE TWO TABLETS ON DAY 1 AND THEN ONE HOUR LATER, TAKE A THIRD TABLET TO TOTAL 1.8 MG DAILY. FOR PREVENTION, TAKE ONE 0.6 MG TABLET DAILY WITH ALLOPURINOL. (Patient taking differently: as needed. For ACUTE gout, take two tablets on day 1 and then one hour later, take a third tablet to total 1.8 mg daily. For prevention, take one 0.6 mg tablet daily with allopurinol.)   furosemide (LASIX) 20 MG tablet TAKE 1 TABLET (20 MG TOTAL) BY MOUTH DAILY AS NEEDED FOR EDEMA.   hydrochlorothiazide (HYDRODIURIL) 12.5 MG tablet TAKE 1 TABLET DAILY FOR    BLOOD PRESSURE CONTROL   methocarbamol (ROBAXIN) 500 MG tablet Take 2 tablets (1,000 mg total) by mouth every 6 (six) hours as needed for muscle  spasms.   Multiple Vitamins-Minerals (WOMENS MULTIVITAMIN PO) Take by mouth.   Omega-3 Fatty Acids (FISH OIL DOUBLE STRENGTH) 1200 MG CAPS Take by mouth.   omeprazole (PRILOSEC) 40 MG capsule TAKE 1 CAPSULE DAILY FOR   REFLUX ESOPHAGITIS WITH    GASTRITIS   oxybutynin (DITROPAN-XL) 10 MG 24 hr tablet Take 1 tablet (10 mg total) by mouth daily.   POTASSIUM CITRATE PO Take by mouth daily.   sertraline (ZOLOFT) 100 MG tablet TAKE 1 AND 1/2 TABLETS     DAILY    valsartan (DIOVAN) 160 MG tablet TAKE 1 TABLET DAILY FOR BLOOD PRESSURE CONTROL   [DISCONTINUED] ketorolac (TORADOL) 10 MG tablet Take 1 tablet (10 mg total) by mouth every 6 (six) hours as needed.   Facility-Administered Medications Prior to Visit  Medication Dose Route Frequency Provider   promethazine (PHENERGAN) injection 25 mg  25 mg Intramuscular Q6H PRN Alfredia Ferguson, PA-C   Review of Systems  Constitutional:  Negative for appetite change, chills, fatigue and fever.  Respiratory:  Negative for chest tightness and shortness of breath.   Cardiovascular:  Negative for chest pain and palpitations.  Gastrointestinal:  Negative for abdominal pain, nausea and vomiting.  Neurological:  Negative for dizziness and weakness.       Objective    BP 128/76 (BP Location: Left Arm, Patient Position: Sitting, Cuff Size: Large)   Pulse 68   Temp 97.7 F (36.5 C) (Temporal)   Resp 12   Ht 5\' 9"  (1.753 m)   Wt 256 lb 14.4 oz (116.5 kg)   SpO2 97%   BMI 37.94 kg/m   Physical Exam   General appearance: Mildly obese female, cooperative and in no acute distress Head: Normocephalic, without obvious abnormality, atraumatic Respiratory: Respirations even and unlabored, normal respiratory rate Extremities: All extremities are intact.  Skin: Skin color, texture, turgor normal. No rashes seen  Psych: Appropriate mood and affect. Neurologic: Mental status: Alert, oriented to person, place, and time, thought content appropriate.    Assessment & Plan     Assessment and Plan    Migraine Headaches Increased frequency of migraines, possibly related to stress. Current treatment with Toradol provides some relief. No history of prophylactic treatment. -Prescribe Maxalt for acute migraines when Toradol is ineffective. -Refill Toradol prescription. -Consider stress management strategies.  Diabetes Mellitus Last HbA1c was 6.4, indicating good control. -Schedule follow-up appointment in the next  couple of months  Post-Cataract Surgery Recent cataract surgery with residual inflammation. -Continue current eye drops as prescribed by ophthalmologist.       Return in about 6 weeks (around 05/19/2023) for Diabetes.      Mila Merry, MD  [facility] [phone] (phone) [phone] (fax)  [facility]

## 2023-05-03 ENCOUNTER — Ambulatory Visit: Payer: BC Managed Care – PPO | Admitting: Urology

## 2023-05-13 ENCOUNTER — Other Ambulatory Visit: Payer: Self-pay | Admitting: Physician Assistant

## 2023-05-13 DIAGNOSIS — F418 Other specified anxiety disorders: Secondary | ICD-10-CM

## 2023-05-19 ENCOUNTER — Ambulatory Visit: Payer: BC Managed Care – PPO | Admitting: Family Medicine

## 2023-05-28 ENCOUNTER — Encounter: Payer: Self-pay | Admitting: Family Medicine

## 2023-05-28 ENCOUNTER — Ambulatory Visit: Payer: BC Managed Care – PPO | Admitting: Family Medicine

## 2023-05-28 VITALS — BP 151/70 | HR 73 | Temp 98.6°F | Ht 69.0 in | Wt 257.2 lb

## 2023-05-28 DIAGNOSIS — G4733 Obstructive sleep apnea (adult) (pediatric): Secondary | ICD-10-CM | POA: Diagnosis not present

## 2023-05-28 DIAGNOSIS — E119 Type 2 diabetes mellitus without complications: Secondary | ICD-10-CM | POA: Diagnosis not present

## 2023-05-28 DIAGNOSIS — I10 Essential (primary) hypertension: Secondary | ICD-10-CM | POA: Diagnosis not present

## 2023-05-28 LAB — POCT GLYCOSYLATED HEMOGLOBIN (HGB A1C): Hemoglobin A1C: 6.6 % — AB (ref 4.0–5.6)

## 2023-05-28 MED ORDER — HYDROCHLOROTHIAZIDE 25 MG PO TABS: 25.0000 mg | ORAL_TABLET | Freq: Every day | ORAL | 2 refills | Status: DC

## 2023-05-28 NOTE — Patient Instructions (Signed)
.   Please review the attached list of medications and notify my office if there are any errors.   . Please bring all of your medications to every appointment so we can make sure that our medication list is the same as yours.   

## 2023-06-02 ENCOUNTER — Ambulatory Visit (INDEPENDENT_AMBULATORY_CARE_PROVIDER_SITE_OTHER): Payer: BC Managed Care – PPO

## 2023-06-02 DIAGNOSIS — Z23 Encounter for immunization: Secondary | ICD-10-CM

## 2023-06-11 ENCOUNTER — Telehealth: Payer: Self-pay

## 2023-06-11 NOTE — Progress Notes (Signed)
Established patient visit   Patient: Miranda Moore   DOB: Apr 04, 1961   62 y.o. Female  MRN: [id number] Visit Date: 05/28/2023  Today's healthcare provider: Mila Merry, MD   Chief Complaint  Patient presents with   Diabetes   Hypertension   Subjective    Discussed the use of AI scribe software for clinical note transcription with the patient, who gave verbal consent to proceed.  History of Present Illness   The patient, known to have diabetes and hypertension, presents for a routine check-up. She reports good overall health with no new complaints. She occasionally monitors her blood glucose at home, with readings ranging between 125 and 150. She reports a significant improvement in her HbA1c, which has decreased from 6.7 in March to 6.1 currently.  The patient is compliant with her medications, but reports issues with her CPAP machine, which she has been using for the past five years. She describes discomfort with the full mask, leading to dry mouth upon waking and occasional nosebleeds. This has been a persistent issue for the past year despite attempts at adjustment.  The patient does not regularly monitor her blood pressure at home, but notes that it was high during the current visit. She denies any missed doses of her medications. She mentions a pending refill for amlodipine from her pharmacy.  The patient also brings up a concern about her daughter's asthma management, but this is not directly related to her own health status.       Medications: Outpatient Medications Prior to Visit  Medication Sig   allopurinol (ZYLOPRIM) 100 MG tablet TAKE 1 TABLET BY MOUTH EVERY DAY   ALPRAZolam (XANAX) 0.5 MG tablet TAKE 1/2 - 1 TABLET (0.25 - 0.5 MG TOTAL) BY MOUTH TWICE A DAY AS NEEDED FOR ANXIETY   amLODipine (NORVASC) 10 MG tablet TAKE 1 TABLET DAILY FOR    BLOOD PRESSURE CONTROL   aspirin 81 MG tablet Take 81 mg by mouth daily.    atorvastatin (LIPITOR) 10 MG tablet TAKE 1  TABLET DAILY   Calcium Carbonate-Vitamin D 600-200 MG-UNIT TABS    Cholecalciferol (VITAMIN D-3) 1000 UNITS CAPS Take by mouth daily.   colchicine 0.6 MG tablet FOR ACUTE GOUT, TAKE TWO TABLETS ON DAY 1 AND THEN ONE HOUR LATER, TAKE A THIRD TABLET TO TOTAL 1.8 MG DAILY. FOR PREVENTION, TAKE ONE 0.6 MG TABLET DAILY WITH ALLOPURINOL. (Patient taking differently: as needed. For ACUTE gout, take two tablets on day 1 and then one hour later, take a third tablet to total 1.8 mg daily. For prevention, take one 0.6 mg tablet daily with allopurinol.)   furosemide (LASIX) 20 MG tablet TAKE 1 TABLET (20 MG TOTAL) BY MOUTH DAILY AS NEEDED FOR EDEMA.   ketorolac (TORADOL) 10 MG tablet Take 1 tablet (10 mg total) by mouth every 6 (six) hours as needed.   methocarbamol (ROBAXIN) 500 MG tablet Take 2 tablets (1,000 mg total) by mouth every 6 (six) hours as needed for muscle spasms.   Multiple Vitamins-Minerals (WOMENS MULTIVITAMIN PO) Take by mouth.   Omega-3 Fatty Acids (FISH OIL DOUBLE STRENGTH) 1200 MG CAPS Take by mouth.   omeprazole (PRILOSEC) 40 MG capsule TAKE 1 CAPSULE DAILY FOR   REFLUX ESOPHAGITIS WITH    GASTRITIS   oxybutynin (DITROPAN-XL) 10 MG 24 hr tablet Take 1 tablet (10 mg total) by mouth daily.   POTASSIUM CITRATE PO Take by mouth daily.   rizatriptan (MAXALT-MLT) 10 MG disintegrating tablet Take 1  tablet (10 mg total) by mouth as needed for migraine. May repeat in 2 hours if needed   sertraline (ZOLOFT) 100 MG tablet TAKE 1 AND 1/2 TABLETS     DAILY   valsartan (DIOVAN) 160 MG tablet TAKE 1 TABLET DAILY FOR BLOOD PRESSURE CONTROL   [DISCONTINUED] hydrochlorothiazide (HYDRODIURIL) 12.5 MG tablet TAKE 1 TABLET DAILY FOR    BLOOD PRESSURE CONTROL   Facility-Administered Medications Prior to Visit  Medication Dose Route Frequency Provider   promethazine (PHENERGAN) injection 25 mg  25 mg Intramuscular Q6H PRN Drubel, Lillia Abed, PA-C   Review of Systems     Objective    BP (!) 151/70   Pulse  73   Temp 98.6 F (37 C) (Oral)   Ht 5\' 9"  (1.753 m)   Wt 257 lb 3.2 oz (116.7 kg)   SpO2 98%   BMI 37.98 kg/m   Physical Exam   General: Appearance:    Mildly obese female in no acute distress  Eyes:    PERRL, conjunctiva/corneas clear, EOM's intact       Lungs:     Clear to auscultation bilaterally, respirations unlabored  Heart:    Normal heart rate. Normal rhythm.  3/6 systolic murmur at right upper sternal border   MS:   All extremities are intact.    Neurologic:   Awake, alert, oriented x 3. No apparent focal neurological defect.           Results for orders placed or performed in visit on 05/28/23  POCT HgB A1C  Result Value Ref Range   Hemoglobin A1C 6.6 (A) 4.0 - 5.6 %    Assessment & Plan        Type 2 Diabetes Mellitus Home glucose readings between 125-150. A1c improved from 6.7 in March to 6.1 today. -Continue current management.  Obstructive Sleep Apnea Patient reports discomfort with current CPAP machine, leading to dry mouth and nosebleeds. Machine is approximately 62 years old and set at a constant pressure. -Order home sleep study to assess current needs and justify new machine to insurance. -Plan to replace current CPAP machine with an automatically titrating one and a more comfortable mask.  Hypertension Blood pressure was high today, but patient reports it is usually lower. Patient is currently on amlodipine and valsartan. -Increase hydrochlorothiazide from current dose to 25mg  daily. -Check refill status of amlodipine and ensure all medications are refilled.  General Health Maintenance / Followup Plans -Schedule follow-up appointment in March for routine lab work and cholesterol check. -Follow up sooner if needed for CPAP machine replacement.    Return in about 5 months (around 10/26/2023) for Hypertension, Diabetes.      Mila Merry, MD  [facility] [phone] (phone) [phone] (fax)  [facility]

## 2023-06-11 NOTE — Telephone Encounter (Signed)
Order has been placed.

## 2023-06-11 NOTE — Telephone Encounter (Signed)
Copied from CRM [id number]. Topic: Referral - Request for Referral >> Jun 10, 2023  4:32 PM Phill Myron wrote: Please call Ms Lawrence, her and Dr Sherrie Mustache talked about her doing a sleep study and she would like to know the status of that referral.

## 2023-06-13 ENCOUNTER — Other Ambulatory Visit: Payer: Self-pay | Admitting: Physician Assistant

## 2023-06-13 DIAGNOSIS — M109 Gout, unspecified: Secondary | ICD-10-CM

## 2023-07-14 ENCOUNTER — Encounter: Payer: Self-pay | Admitting: Neurology

## 2023-07-14 ENCOUNTER — Ambulatory Visit (INDEPENDENT_AMBULATORY_CARE_PROVIDER_SITE_OTHER): Payer: BC Managed Care – PPO | Admitting: Neurology

## 2023-07-14 VITALS — BP 146/90 | HR 89 | Ht 69.0 in | Wt 265.0 lb

## 2023-07-14 DIAGNOSIS — G4733 Obstructive sleep apnea (adult) (pediatric): Secondary | ICD-10-CM

## 2023-07-14 DIAGNOSIS — R519 Headache, unspecified: Secondary | ICD-10-CM

## 2023-07-14 DIAGNOSIS — G4719 Other hypersomnia: Secondary | ICD-10-CM | POA: Diagnosis not present

## 2023-07-14 DIAGNOSIS — R635 Abnormal weight gain: Secondary | ICD-10-CM | POA: Diagnosis not present

## 2023-07-14 DIAGNOSIS — E669 Obesity, unspecified: Secondary | ICD-10-CM

## 2023-07-14 DIAGNOSIS — Z789 Other specified health status: Secondary | ICD-10-CM | POA: Diagnosis not present

## 2023-07-14 NOTE — Progress Notes (Addendum)
Subjective:    Patient ID: Miranda Moore is a 62 y.o. female.  HPI    Huston Foley, MD, PhD [facility] [address], [address] [address] [place], [place] [address]  Dear Dr. Sherrie Mustache,  I saw your patient, Miranda Moore, upon your kind request in my sleep clinic today for initial consultation of her sleep disorder, in particular, evaluation of her prior diagnosis of obstructive sleep apnea.  The patient is unaccompanied today.  As you know, Miranda Moore is a 62 year old female with an underlying medical history of diabetes, hypertension, reflux disease, depression, anxiety, and obesity, who reports difficulty with the fit of her fullface mask.  She reports compliance with her CPAP.  She brought her machine or mask today, she uses a small and 20 fullface mask from [employer].  Her leak from the mask is indeed higher, I reviewed her compliance data for the past 30 days from 06/13/2021 through 07/13/2023, during which time she used her machine 29 out of 30 days with percent use days greater than 4 hours at 93%, indicating excellent compliance, average usage of 7 hours and 36 minutes, residual AHI at goal at 0.9/h, pressure at 15 cm, 95th percentile of leak at 57.1 L/min.,  She agrees that she has benefited from PAP therapy over the past few years.  Her weight has been fluctuating, she does report weight gain.  She has a [employer] air sense 10 AutoSet machine and DME provider is [facility].  Her Epworth sleepiness score is 9 out of 24, fatigue severity score is 34 out of 63.  She has a mildly elevated blood pressure value today. She was diagnosed with obstructive sleep apnea several years ago and placed on therapy.  I reviewed her CPAP titration study from 11/19/2017.  Her BMI was recorded at 34 at the time.  The study was interpreted by Dr. Anthoney Harada.  Study was conducted at [facility] sleep lab.  Reportedly, her previous PSG had shown an AHI of 44.5/h, worse during  REM sleep and while supine.  Based on the CPAP titration results she was advised CPAP at 15 cm, work on weight loss and avoiding sleeping supine.  She was noted to have a high PLM index with low arousals.  She was on an SSRI at the time, PLM index was 31.1/h, associated arousal index was 0.2/h. Bedtime is generally around 9 and rise time between 6 and 7.  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  She lives with her husband, they have a small dog in the household.  She has 2 grown daughters and 2 grandchildren.  She has occasional morning headaches.  She had a tonsillectomy.  She drinks caffeine in the form of tea or soda about 1/day, she tries to turn off her TV at night.  She is a non-smoker and does not currently drink any alcohol.  Her Past Medical History Is Significant For: Past Medical History:  Diagnosis Date   Abortion, spontaneous 05/31/2015   2.      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 07/13/2006   MRSA infection 2007   left lower leg   Pre-diabetes    Sleep apnea    uses CPAP nightly    Her Past Surgical History Is Significant For:  Past Surgical History:  Procedure Laterality Date   ABDOMINAL HYSTERECTOMY  2002   APPENDECTOMY  2013   laparascopic with removal of tubal remnant   BREAST BIOPSY Right 10/14/2022   Right Breast Stereo Bx X Clip- path pending   BREAST BIOPSY Right 10/14/2022   Right Breast Stereo Bx Coil Clip path pending   BREAST BIOPSY Right 10/14/2022   MM RT BREAST BX W LOC DEV 1ST LESION IMAGE BX SPEC STEREO GUIDE 10/14/2022 [facility]   BREAST BIOPSY Right 10/14/2022   MM RT BREAST BX W LOC DEV EA AD LESION IMG BX SPEC STEREO GUIDE 10/14/2022 [facility]   CATARACT EXTRACTION W/PHACO Right 02/03/2023   Procedure: CATARACT EXTRACTION PHACO AND INTRAOCULAR LENS PLACEMENT (IOC) RIGHT  DIABETIC 2.95 00:24.4;  Surgeon: Lockie Mola, MD;  Location: [facility];  Service: Ophthalmology;  Laterality: Right;   CATARACT EXTRACTION W/PHACO Left 02/17/2023   Procedure: CATARACT EXTRACTION PHACO AND INTRAOCULAR LENS PLACEMENT (IOC) LEFT DIABETIC 4.37 00:32.1;  Surgeon: Lockie Mola, MD;  Location: [facility];  Service: Ophthalmology;  Laterality: Left;   COLONOSCOPY  2015   COLONOSCOPY WITH PROPOFOL N/A 06/04/2020   Procedure: COLONOSCOPY WITH BIOPSY;  Surgeon: Pasty Spillers, MD;  Location: [facility];  Service: Endoscopy;  Laterality: N/A;   KNEE SURGERY  2012   LAPAROSCOPIC OOPHORECTOMY  2012   POLYPECTOMY N/A 06/04/2020   Procedure: POLYPECTOMY;  Surgeon: Pasty Spillers, MD;  Location: [facility];  Service: Endoscopy;  Laterality: N/A;   WRIST ARTHROSCOPY WITH DEBRIDEMENT Right 08/30/2018   Procedure: WRIST ARTHROSCOPY WITH DEBRIDEMENT AND SYNOVECTOMY;  Surgeon: Dominica Severin, MD;  Location: [facility];  Service: Orthopedics;  Laterality: Right;   WRIST SURGERY  02/11/2021    Her Family History Is Significant For: Family History  Problem Relation Age of Onset   Cancer Mother        colon   Heart failure Mother    Heart disease Father    Hypertension Father    Migraines Sister    Sleep apnea Sister    Stroke Brother    Hypertension Brother    Breast cancer Paternal Aunt    Heart failure Paternal Grandmother    Dementia Paternal Grandfather     Her Social History Is Significant For: Social History   Socioeconomic History   Marital status: Married    Spouse name: Not on file   Number of children: Not on file   Years of education: Not on file   Highest education level: Not on file  Occupational History   Occupation: retired  Tobacco Use   Smoking status: Never    Passive exposure: Never   Smokeless tobacco: Never  Vaping Use   Vaping status: Never Used  Substance and Sexual  Activity   Alcohol use: Yes    Alcohol/week: 0.0 standard drinks of alcohol    Comment: ocassionally   Drug use: No   Sexual activity: Not on file  Other Topics Concern   Not on file  Social History Narrative   Not on file   Social Determinants of Health   Financial Resource Strain: Not on file  Food Insecurity: Not on file  Transportation Needs: Not on file  Physical Activity: Not on file  Stress: Not on file  Social Connections: Not on file    Her Allergies Are:  Allergies  Allergen Reactions   Chlorpheniramine-Phenylephrine Hives   Elemental Sulfur Other (See Comments)    Low b/p   Other Hives  actifed   Sulfa Antibiotics Other (See Comments)   Sulfur Other (See Comments)   Triprolidine-Pseudoephedrine Other (See Comments)  :   Her Current Medications Are:  Outpatient Encounter Medications as of 07/14/2023  Medication Sig   allopurinol (ZYLOPRIM) 100 MG tablet TAKE 1 TABLET BY MOUTH EVERY DAY   ALPRAZolam (XANAX) 0.5 MG tablet TAKE 1/2 - 1 TABLET (0.25 - 0.5 MG TOTAL) BY MOUTH TWICE A DAY AS NEEDED FOR ANXIETY   amLODipine (NORVASC) 10 MG tablet TAKE 1 TABLET DAILY FOR    BLOOD PRESSURE CONTROL   aspirin 81 MG tablet Take 81 mg by mouth daily.    atorvastatin (LIPITOR) 10 MG tablet TAKE 1 TABLET DAILY   Calcium Carbonate-Vitamin D 600-200 MG-UNIT TABS    Cholecalciferol (VITAMIN D-3) 1000 UNITS CAPS Take by mouth daily.   furosemide (LASIX) 20 MG tablet TAKE 1 TABLET (20 MG TOTAL) BY MOUTH DAILY AS NEEDED FOR EDEMA.   hydrochlorothiazide (HYDRODIURIL) 25 MG tablet Take 1 tablet (25 mg total) by mouth daily.   ketorolac (TORADOL) 10 MG tablet Take 1 tablet (10 mg total) by mouth every 6 (six) hours as needed.   methocarbamol (ROBAXIN) 500 MG tablet Take 2 tablets (1,000 mg total) by mouth every 6 (six) hours as needed for muscle spasms.   Multiple Vitamins-Minerals (WOMENS MULTIVITAMIN PO) Take by mouth.   Omega-3 Fatty Acids (FISH OIL DOUBLE STRENGTH) 1200 MG CAPS  Take by mouth.   omeprazole (PRILOSEC) 40 MG capsule TAKE 1 CAPSULE DAILY FOR   REFLUX ESOPHAGITIS WITH    GASTRITIS   oxybutynin (DITROPAN-XL) 10 MG 24 hr tablet Take 1 tablet (10 mg total) by mouth daily.   POTASSIUM CITRATE PO Take by mouth daily.   rizatriptan (MAXALT-MLT) 10 MG disintegrating tablet Take 1 tablet (10 mg total) by mouth as needed for migraine. May repeat in 2 hours if needed   sertraline (ZOLOFT) 100 MG tablet TAKE 1 AND 1/2 TABLETS     DAILY   valsartan (DIOVAN) 160 MG tablet TAKE 1 TABLET DAILY FOR BLOOD PRESSURE CONTROL   colchicine 0.6 MG tablet FOR ACUTE GOUT, TAKE TWO TABLETS ON DAY 1 AND THEN ONE HOUR LATER, TAKE A THIRD TABLET TO TOTAL 1.8 MG DAILY. FOR PREVENTION, TAKE ONE 0.6 MG TABLET DAILY WITH ALLOPURINOL. (Patient not taking: Reported on 07/14/2023)   Facility-Administered Encounter Medications as of 07/14/2023  Medication   promethazine (PHENERGAN) injection 25 mg  :   Review of Systems:  Out of a complete 14 point review of systems, all are reviewed and negative with the exception of these symptoms as listed below:  Review of Systems  Neurological:        Pt is here for Sleep consult. Previous SS in 2019. Pt states her current CPAP gives a dry mouth and nosebleeds. Pt states she does have HTN and headaches/migraines.  ESS 9 FSS 34    Objective:  Neurological Exam  Physical Exam Physical Examination:   Vitals:   07/14/23 1019  BP: (!) 146/90  Pulse: 89    General Examination: The patient is a very pleasant 62 y.o. female in no acute distress. She appears well-developed and well-nourished and well groomed.   HEENT: Normocephalic, atraumatic, pupils are equal, round and reactive to light, extraocular tracking is good without limitation to gaze excursion or nystagmus noted. Hearing is grossly intact. Face is symmetric with normal facial animation. Speech is clear with no dysarthria noted. There is no hypophonia. There is no lip, neck/head, jaw  or  voice tremor. Neck is supple with full range of passive and active motion. There are no carotid bruits on auscultation. Oropharynx exam reveals: mild mouth dryness, adequate dental hygiene and moderate airway crowding, due to small airway entry, larger uvula and thicker tongue noted.  Mallampati class III.  Neck circumference 16-7/8 inches.  Tongue protrudes centrally and palate symmetrically.  Chest: Clear to auscultation without wheezing, rhonchi or crackles noted.  Heart: S1+S2+0, regular with a systolic murmur noted.  She reports that she has had a heart murmur for years.    Abdomen: Soft, non-tender and non-distended.  Extremities: There is nonpitting puffiness in the distal lower extremities bilaterally.   Skin: Warm and dry without trophic changes noted.   Musculoskeletal: exam reveals no obvious joint deformities.   Neurologically:  Mental status: The patient is awake, alert and oriented in all 4 spheres. Her immediate and remote memory, attention, language skills and fund of knowledge are appropriate. There is no evidence of aphasia, agnosia, apraxia or anomia. Speech is clear with normal prosody and enunciation. Thought process is linear. Mood is normal and affect is normal.  Cranial nerves II - XII are as described above under HEENT exam.  Motor exam: Normal bulk, strength and tone is noted. There is no obvious action or resting tremor.  Fine motor skills and coordination: grossly intact.  Cerebellar testing: No dysmetria or intention tremor. There is no truncal or gait ataxia.  Sensory exam: intact to light touch in the upper and lower extremities.  Gait, station and balance: She stands easily. No veering to one side is noted. No leaning to one side is noted. Posture is age-appropriate and stance is narrow based. Gait shows normal stride length and normal pace. No problems turning are noted.   Assessment and plan:  In summary, Miranda Moore is a very pleasant 62 y.o.-year old  female with an underlying medical history of diabetes, hypertension, reflux disease, depression, anxiety, and obesity, who presents for evaluation of her obstructive sleep apnea.  She was diagnosed with severe obstructive sleep apnea over 5 years ago and has been on CPAP therapy for over 5 years, she does struggle with the mask fit currently.  I was able to provide her with a sample of a [employer] AirTouch fullface mask size small so she can try it out.  She may be a good candidate for an under the nose style fullface mask, she does report mouth breathing.  Her current mask and the new sample mask have magnetic closures.  She has not had any problems thus far but does indicate that she has clamps in her right breast to mark prior lesions.  She is encouraged to double check with her GYN if this clamps in place cause any concern with the magnets in her CPAP mask headgear.  Her husband does not have any implanted medical devices.  I recommended that we proceed with a home sleep test for reevaluation of her sleep apnea.  She has had some weight fluctuation.  She should be eligible for new machine.  She may choose a different DME provider after the test.  We will plan to follow-up after testing.  If she starts treatment with a new machine, she will need a follow-up in sleep clinic within 2 to 3 months after set up.  We talked about alternative treatment options of sleep apnea.  She had some questions about inspire.  I answered all her questions today.  She is encouraged to work on  weight loss.  We talked about the importance of treating obstructive sleep apnea, especially if it is in the moderate to severe range.  Thank you very much for allowing me to participate in the care of this nice patient. If I can be of any further assistance to you please do not hesitate to call me at [phone].  Sincerely,   Huston Foley, MD, PhD

## 2023-07-14 NOTE — Patient Instructions (Addendum)
It was nice to meet you today.  As discussed, I will order a home sleep test for re-evaluation of your sleep apnea, as testing was over 5 years ago. The home sleep test is done (HST) to re-establish/confirm the sleep apnea diagnosis and to get your a new machine through the insurance. Our sleep lab staff will reach out to you to arrange for pickup and for tutorial of the test equipment. I will write for a new machine after the HST confirms the OSA (obstructive sleep apnea) diagnosis. Please remember, you will not use your CPAP machine during the night of testing so we get diagnostic data, not treatment data. We will schedule a follow-up appointment after the new machine set-up date, typically within 31 to 89 days post treatment start.  Per insurance requirement, you will need to show compliance with usage and fulfill a minimum usage percentage.  Try the sample full face mask I provided today, it's called [employer] Airtouch F20, size small. Double check with your gyn if there is any concern regarding the clamps in your right breast and the magnetic buttons of your CPAP mask.

## 2023-07-20 ENCOUNTER — Encounter: Payer: Self-pay | Admitting: Family Medicine

## 2023-07-20 DIAGNOSIS — G4733 Obstructive sleep apnea (adult) (pediatric): Secondary | ICD-10-CM | POA: Insufficient documentation

## 2023-07-23 ENCOUNTER — Other Ambulatory Visit: Payer: Self-pay | Admitting: Family Medicine

## 2023-07-23 ENCOUNTER — Other Ambulatory Visit: Payer: Self-pay | Admitting: Physician Assistant

## 2023-07-23 DIAGNOSIS — F418 Other specified anxiety disorders: Secondary | ICD-10-CM

## 2023-07-23 DIAGNOSIS — E1169 Type 2 diabetes mellitus with other specified complication: Secondary | ICD-10-CM

## 2023-07-23 NOTE — Telephone Encounter (Signed)
Requested medication (s) are due for refill today: Yes  Requested medication (s) are on the active medication list: Yes  Last refill:  05/20/23  Future visit scheduled: Yes  Notes to clinic:  Not delegated.    Requested Prescriptions  Pending Prescriptions Disp Refills   ALPRAZolam (XANAX) 0.5 MG tablet [Pharmacy Med Name: ALPRAZOLAM 0.5 MG TABLET] 60 tablet 0    Sig: TAKE 1/2 - 1 TABLET (0.25 - 0.5 MG TOTAL) BY MOUTH TWICE A DAY AS NEEDED FOR ANXIETY     Not Delegated - Psychiatry: Anxiolytics/Hypnotics 2 Failed - 07/23/2023  2:58 PM      Failed - This refill cannot be delegated      Failed - Urine Drug Screen completed in last 360 days      Passed - Patient is not pregnant      Passed - Valid encounter within last 6 months    Recent Outpatient Visits           1 month ago Type 2 diabetes mellitus without complication, without long-term current use of insulin (HCC)   [facility] Malva Limes, MD   3 months ago Acute intractable headache, unspecified headache type   [facility] Malva Limes, MD   8 months ago Acute intractable headache, unspecified headache type   [facility] Alfredia Ferguson, PA-C   8 months ago Breast hematoma   [facility] Alfredia Ferguson, PA-C   1 year ago Benign essential HTN   [facility] Alfredia Ferguson, PA-C       Future Appointments             In 3 months Fisher, Demetrios Isaacs, MD [facility]   In 8 months MacDiarmid, Lorin Picket, MD [facility]

## 2023-08-09 ENCOUNTER — Encounter: Payer: Self-pay | Admitting: Neurology

## 2023-08-11 ENCOUNTER — Other Ambulatory Visit: Payer: Self-pay | Admitting: Physician Assistant

## 2023-08-11 DIAGNOSIS — R600 Localized edema: Secondary | ICD-10-CM

## 2023-08-12 ENCOUNTER — Other Ambulatory Visit: Payer: Self-pay | Admitting: Physician Assistant

## 2023-08-12 DIAGNOSIS — K2100 Gastro-esophageal reflux disease with esophagitis, without bleeding: Secondary | ICD-10-CM

## 2023-08-17 LAB — HM DIABETES EYE EXAM

## 2023-09-07 ENCOUNTER — Ambulatory Visit: Payer: Self-pay | Admitting: Neurology

## 2023-09-07 DIAGNOSIS — R635 Abnormal weight gain: Secondary | ICD-10-CM

## 2023-09-07 DIAGNOSIS — G4733 Obstructive sleep apnea (adult) (pediatric): Secondary | ICD-10-CM

## 2023-09-07 DIAGNOSIS — E669 Obesity, unspecified: Secondary | ICD-10-CM

## 2023-09-07 DIAGNOSIS — G4719 Other hypersomnia: Secondary | ICD-10-CM

## 2023-09-07 DIAGNOSIS — Z789 Other specified health status: Secondary | ICD-10-CM

## 2023-09-07 DIAGNOSIS — R519 Headache, unspecified: Secondary | ICD-10-CM

## 2023-09-08 NOTE — Progress Notes (Signed)
See procedure note.

## 2023-09-10 NOTE — Addendum Note (Signed)
Addended by: Huston Foley on: 09/10/2023 12:21 PM   Modules accepted: Orders

## 2023-09-10 NOTE — Procedures (Signed)
[facility]  HOME SLEEP TEST (Watch PAT) REPORT  STUDY DATE: 09/07/2023  DOB: 1961-03-09  MRN: [id number]  ORDERING CLINICIAN: Huston Foley, MD, PhD   REFERRING CLINICIAN: Malva Limes, MD   CLINICAL INFORMATION/HISTORY: 63 year old female with an underlying medical history of diabetes, hypertension, reflux disease, depression, anxiety, and obesity, who presents for re-evaluation of her obstructive sleep apnea.  She is compliant with her CPAP at 15 cm and has benefited from treatment.  Her previous sleep study was over 5 years ago.  She was diagnosed with severe obstructive sleep apnea at the time.  She should be eligible for a new machine.   Epworth sleepiness score: 9/24.  BMI: 39.1 kg/m  FINDINGS:   Sleep Summary:   Total Recording Time (hours, min): 10 hours, 14 min  Total Sleep Time (hours, min):  9 hours, 3 min  Percent REM (%):    23.7%   Respiratory Indices:   Calculated pAHI (per hour):  57.5/hour         REM pAHI:    61/hour       NREM pAHI: 56.4/hour  Central pAHI: 8.9/hour  Oxygen Saturation Statistics:    Oxygen Saturation (%) Mean: 94%   Minimum oxygen saturation (%):                 82%   O2 Saturation Range (%): 82- 99%    O2 Saturation (minutes) <=88%: 3.2 min  Pulse Rate Statistics:   Pulse Mean (bpm):    74/min    Pulse Range (58- 101/min)   IMPRESSION: OSA (obstructive sleep apnea), severe   RECOMMENDATION:  This home sleep test confirms severe obstructive sleep apnea with a total AHI of 57.5/h, O2 nadir 82%.  Snoring was detected in the moderate to loud range fairly consistently throughout the study.  Ongoing treatment with positive airway pressure is highly recommended. The patient has been compliant with her CPAP of 15 cm with adequate apnea control.  She has had some difficulty with her mask seal.  I would like to prescribe a new CPAP machine and a new mask, size to fit. A laboratory attended titration study  can be considered in the future for optimization of treatment settings and to improve tolerance and compliance, if needed, down the road. Alternative treatment options are limited secondary to the severity of the patient's sleep disordered breathing, but may include surgical treatment with an implantable hypoglossal nerve stimulator (in carefully selected candidates, meeting criteria).  Concomitant weight loss is recommended (where clinically appropriate). Please note, that untreated obstructive sleep apnea may carry additional perioperative morbidity. Patients with significant obstructive sleep apnea should receive perioperative PAP therapy and the surgeons and particularly the anesthesiologist should be informed of the diagnosis and the severity of the sleep disordered breathing. The patient should be cautioned not to drive, work at heights, or operate dangerous or heavy equipment when tired or sleepy. Review and reiteration of good sleep hygiene measures should be pursued with any patient. Other causes of the patient's symptoms, including circadian rhythm disturbances, an underlying mood disorder, medication effect and/or an underlying medical problem cannot be ruled out based on this test. Clinical correlation is recommended.  The patient and her referring provider will be notified of the test results. The patient will be seen in follow up in sleep clinic at [facility].  I certify that I have reviewed the raw data recording prior to the issuance of this report in accordance with the standards of the American Academy  of Sleep Medicine (AASM).    INTERPRETING PHYSICIAN:   Huston Foley, MD, PhD Medical Director, [facility]) Diplomat, ABPN (Neurology and Sleep)   [facility] [address] [place], [place] [address] [phone]

## 2023-09-13 ENCOUNTER — Telehealth: Payer: Self-pay | Admitting: Anesthesiology

## 2023-09-13 NOTE — Telephone Encounter (Signed)
-----   Message from Huston Foley sent at 09/10/2023 12:21 PM EST ----- Patient referred by PCP for reevaluation of her OSA.  She has been on CPAP of 15 cm but should be eligible for new machine.  She has had some trouble with her mask seal and tolerance.  I saw her on 07/14/2023 and she had a home sleep test on 09/07/2023. Please call and notify the patient that the recent home sleep test confirmed severe obstructive sleep apnea.  We will send her CPAP order to her current DME company which is [employer] [facility].  We will need a follow-up in sleep clinic with the nurse practitioner or myself in about 2 to 3 months after set up.  Please reinforce ongoing full compliance.  Please encourage patient to continue to work on weight loss as well.    Huston Foley, MD, PhD [facility])

## 2023-09-13 NOTE — Telephone Encounter (Signed)
Called pt and relayed Dr Teofilo Pod message with sleep study results. Pt stated that she would like to change DME company, states she is not happy with [employer]. Advised that the new order will be sent to [facility]. Advised pt that if she doesn't hear from them in a week's time to give them a call herself so she can get set up with the CPAP, [facility]'s telephone number was provided. Also advise pt to either give [place] a call or send [place] a message through MyChart once she gets set up with her CPAP so that we can get her scheduled for an initial CPAP visit with Dr Frances Furbish. I stressed the importance of full compliance with CPAP usage. Pt verbalized understanding and had not additional questions at this time.  New CPAP order has been sent to [facility].

## 2023-09-14 ENCOUNTER — Telehealth: Payer: Self-pay | Admitting: Family Medicine

## 2023-09-14 NOTE — Telephone Encounter (Signed)
[facility] faxed refill request for the following medications:   methocarbamol (ROBAXIN) 500 MG tablet     Please advise.

## 2023-09-16 MED ORDER — METHOCARBAMOL 500 MG PO TABS: 1000.0000 mg | ORAL_TABLET | Freq: Four times a day (QID) | ORAL | 1 refills | Status: DC | PRN

## 2023-10-05 ENCOUNTER — Encounter: Payer: Self-pay | Admitting: Neurology

## 2023-10-26 ENCOUNTER — Encounter: Payer: Self-pay | Admitting: Family Medicine

## 2023-10-26 ENCOUNTER — Ambulatory Visit: Payer: Self-pay | Admitting: Family Medicine

## 2023-10-26 VITALS — BP 147/70 | HR 68 | Resp 16 | Wt 268.0 lb

## 2023-10-26 DIAGNOSIS — I10 Essential (primary) hypertension: Secondary | ICD-10-CM

## 2023-10-26 DIAGNOSIS — G8929 Other chronic pain: Secondary | ICD-10-CM

## 2023-10-26 DIAGNOSIS — E559 Vitamin D deficiency, unspecified: Secondary | ICD-10-CM

## 2023-10-26 DIAGNOSIS — M25539 Pain in unspecified wrist: Secondary | ICD-10-CM

## 2023-10-26 DIAGNOSIS — Z1159 Encounter for screening for other viral diseases: Secondary | ICD-10-CM

## 2023-10-26 DIAGNOSIS — E7800 Pure hypercholesterolemia, unspecified: Secondary | ICD-10-CM | POA: Diagnosis not present

## 2023-10-26 DIAGNOSIS — E119 Type 2 diabetes mellitus without complications: Secondary | ICD-10-CM

## 2023-10-27 LAB — MICROALBUMIN / CREATININE URINE RATIO
Creatinine, Urine: 101.6 mg/dL
Microalb/Creat Ratio: 5 mg/g{creat} (ref 0–29)
Microalbumin, Urine: 4.6 ug/mL

## 2023-10-27 LAB — COMPREHENSIVE METABOLIC PANEL
ALT: 26 IU/L (ref 0–32)
AST: 28 IU/L (ref 0–40)
Albumin: 4.2 g/dL (ref 3.9–4.9)
Alkaline Phosphatase: 109 IU/L (ref 44–121)
BUN/Creatinine Ratio: 14 (ref 12–28)
BUN: 12 mg/dL (ref 8–27)
Bilirubin Total: 0.4 mg/dL (ref 0.0–1.2)
CO2: 24 mmol/L (ref 20–29)
Calcium: 10 mg/dL (ref 8.7–10.3)
Chloride: 103 mmol/L (ref 96–106)
Creatinine, Ser: 0.83 mg/dL (ref 0.57–1.00)
Globulin, Total: 3 g/dL (ref 1.5–4.5)
Glucose: 137 mg/dL — ABNORMAL HIGH (ref 70–99)
Potassium: 3.9 mmol/L (ref 3.5–5.2)
Sodium: 142 mmol/L (ref 134–144)
Total Protein: 7.2 g/dL (ref 6.0–8.5)
eGFR: 80 mL/min/{1.73_m2} (ref >59)

## 2023-10-27 LAB — VITAMIN D 25 HYDROXY (VIT D DEFICIENCY, FRACTURES): Vit D, 25-Hydroxy: 45.1 ng/mL (ref 30.0–100.0)

## 2023-10-27 LAB — HEMOGLOBIN A1C
Est. average glucose Bld gHb Est-mCnc: 174 mg/dL
Hgb A1c MFr Bld: 7.7 % — ABNORMAL HIGH (ref 4.8–5.6)

## 2023-10-27 LAB — LIPID PANEL
Chol/HDL Ratio: 4.7 ratio — ABNORMAL HIGH (ref 0.0–4.4)
Cholesterol, Total: 173 mg/dL (ref 100–199)
HDL: 37 mg/dL — ABNORMAL LOW (ref >39)
LDL Chol Calc (NIH): 115 mg/dL — ABNORMAL HIGH (ref 0–99)
Triglycerides: 116 mg/dL (ref 0–149)
VLDL Cholesterol Cal: 21 mg/dL (ref 5–40)

## 2023-10-27 LAB — HEPATITIS C ANTIBODY: Hep C Virus Ab: NONREACTIVE

## 2023-10-28 ENCOUNTER — Encounter: Payer: Self-pay | Admitting: Family Medicine

## 2023-10-28 ENCOUNTER — Other Ambulatory Visit: Payer: Self-pay | Admitting: Family Medicine

## 2023-10-28 MED ORDER — VALSARTAN-HYDROCHLOROTHIAZIDE 160-25 MG PO TABS: 1.0000 | ORAL_TABLET | Freq: Every day | ORAL | 3 refills | Status: DC

## 2023-10-29 ENCOUNTER — Other Ambulatory Visit: Payer: Self-pay | Admitting: Family Medicine

## 2023-10-29 DIAGNOSIS — Z1231 Encounter for screening mammogram for malignant neoplasm of breast: Secondary | ICD-10-CM

## 2023-10-29 DIAGNOSIS — G8929 Other chronic pain: Secondary | ICD-10-CM | POA: Insufficient documentation

## 2023-10-29 NOTE — Progress Notes (Signed)
 Established patient visit   Patient: Miranda Moore   DOB: Mar 24, 1961   63 y.o. Female  MRN: [id number] Visit Date: 10/26/2023  Today's healthcare provider: Mila Merry, MD   Chief Complaint  Patient presents with   Diabetes   Hyperlipidemia   Hypertension   Subjective    Discussed the use of AI scribe software for clinical note transcription with the patient, who gave verbal consent to proceed.  History of Present Illness   Miranda Moore is a 63 year old female with hypertension and diabetes who presents for a regular checkup.  Blood sugar levels are well-controlled at home, typically ranging from 119 to 124 mg/dL. She occasionally consumes sugars and sweets. Her last HbA1c was 6.6%.  She does not monitor her blood pressure at home but is aware that it runs high. She is currently taking amlodipine and hydrochlorothiazide for hypertension. There is a family history of high blood pressure in her father and brother.  She experiences shortness of breath when walking, which she attributes to her weight. She acknowledges the need for weight loss and increased physical activity. No chest pain or heart palpitations.  She has a history of an injury at work that required replacement surgery, leading to persistent wrist pain. The pain flares up with weather changes and excessive use of her hand. She has been using Robaxin for pain management but feels it is not effective. She also uses Toradol for severe headaches, which she finds effective. She has tried magnesium for pain relief.       Medications: Outpatient Medications Prior to Visit  Medication Sig   allopurinol (ZYLOPRIM) 100 MG tablet TAKE 1 TABLET BY MOUTH EVERY DAY   ALPRAZolam (XANAX) 0.5 MG tablet TAKE 1/2 - 1 TABLET (0.25 - 0.5 MG TOTAL) BY MOUTH TWICE A DAY AS NEEDED FOR ANXIETY   amLODipine (NORVASC) 10 MG tablet TAKE 1 TABLET DAILY FOR    BLOOD PRESSURE CONTROL   aspirin 81 MG tablet Take 81 mg by mouth daily.     atorvastatin (LIPITOR) 10 MG tablet TAKE 1 TABLET DAILY   Cholecalciferol (VITAMIN D-3) 1000 UNITS CAPS Take by mouth daily.   colchicine 0.6 MG tablet FOR ACUTE GOUT, TAKE TWO TABLETS ON DAY 1 AND THEN ONE HOUR LATER, TAKE A THIRD TABLET TO TOTAL 1.8 MG DAILY. FOR PREVENTION, TAKE ONE 0.6 MG TABLET DAILY WITH ALLOPURINOL.   furosemide (LASIX) 20 MG tablet TAKE 1 TABLET (20 MG TOTAL) BY MOUTH DAILY AS NEEDED FOR EDEMA.   hydrochlorothiazide (HYDRODIURIL) 25 MG tablet Take 1 tablet (25 mg total) by mouth daily.   ketorolac (TORADOL) 10 MG tablet Take 1 tablet (10 mg total) by mouth every 6 (six) hours as needed.   methocarbamol (ROBAXIN) 500 MG tablet Take 2 tablets (1,000 mg total) by mouth every 6 (six) hours as needed for muscle spasms.   Multiple Vitamins-Minerals (WOMENS MULTIVITAMIN PO) Take by mouth.   Omega-3 Fatty Acids (FISH OIL DOUBLE STRENGTH) 1200 MG CAPS Take by mouth.   omeprazole (PRILOSEC) 40 MG capsule TAKE 1 CAPSULE DAILY FOR REFLUX ESOPHAGITIS WITH GASTRITIS   oxybutynin (DITROPAN-XL) 10 MG 24 hr tablet Take 1 tablet (10 mg total) by mouth daily.   rizatriptan (MAXALT-MLT) 10 MG disintegrating tablet Take 1 tablet (10 mg total) by mouth as needed for migraine. May repeat in 2 hours if needed   sertraline (ZOLOFT) 100 MG tablet TAKE 1 AND 1/2 TABLETS     DAILY (Patient taking  differently: Take 150 mg by mouth daily.)   valsartan (DIOVAN) 160 MG tablet TAKE 1 TABLET DAILY FOR BLOOD PRESSURE CONTROL   Calcium Carbonate-Vitamin D 600-200 MG-UNIT TABS    POTASSIUM CITRATE PO Take by mouth daily.   Facility-Administered Medications Prior to Visit  Medication Dose Route Frequency Provider   promethazine (PHENERGAN) injection 25 mg  25 mg Intramuscular Q6H PRN Drubel, Lillia Abed, PA-C   Review of Systems     Objective    BP (!) 147/70 (BP Location: Left Arm, Patient Position: Sitting, Cuff Size: Large)   Pulse 68   Resp 16   Wt 268 lb (121.6 kg)   BMI 39.58 kg/m   Physical  Exam   General: Appearance:    Obese female in no acute distress  Eyes:    PERRL, conjunctiva/corneas clear, EOM's intact       Lungs:     Clear to auscultation bilaterally, respirations unlabored  Heart:    Normal heart rate. Normal rhythm.  2/6 systolic murmur   MS:   All extremities are intact.    Neurologic:   Awake, alert, oriented x 3. No apparent focal neurological defect.             Assessment & Plan       Type 2 Diabetes Mellitus Blood glucose levels 119-124 mg/dL, YQM5H 8.4%, indicating borderline control. Dietary non-compliance noted. - Check HbA1c today. - Reinforce dietary modifications to reduce sugar intake.  Hypertension & Hyperlipidemia Blood pressure 144 mmHg, suboptimal control. On amlodipine and hydrochlorothiazide. Dosage adjustment considered pending lab results. - She is tolerating atorvastatin well with no adverse effects.   - Check blood work to assess kidney function and electrolytes. - Consider adjusting hydrochlorothiazide dosage based on lab results.  Chronic Wrist Pain Pain secondary to previous injury and surgery, exacerbated by weather and activity. Robaxin ineffective, magnesium tried. - Discuss alternative pain management options if Robaxin is ineffective. - Consider referral to pain management specialist if pain persists.  Vitamin D deficiency - check vitamin D levels.  General Health Maintenance Due for routine blood work, mammogram, and eye exam. Previous biopsy showed fibrocysts. - Order routine blood work including kidney function and cholesterol. - Instruct to schedule mammogram. - Follow up with eye doctor appointment.           Mila Merry, MD  [facility] [phone] (phone) [phone] (fax)  [facility]

## 2023-11-04 ENCOUNTER — Ambulatory Visit
Admission: RE | Admit: 2023-11-04 | Discharge: 2023-11-04 | Disposition: A | Discharge status: Home / Self Care | Source: Ambulatory Visit | Attending: Family Medicine | Admitting: Family Medicine

## 2023-11-04 DIAGNOSIS — Z1231 Encounter for screening mammogram for malignant neoplasm of breast: Secondary | ICD-10-CM | POA: Diagnosis present

## 2023-12-14 ENCOUNTER — Other Ambulatory Visit: Payer: Self-pay | Admitting: Family Medicine

## 2023-12-14 DIAGNOSIS — F418 Other specified anxiety disorders: Secondary | ICD-10-CM

## 2024-01-16 ENCOUNTER — Other Ambulatory Visit: Payer: Self-pay | Admitting: Family Medicine

## 2024-01-16 DIAGNOSIS — F418 Other specified anxiety disorders: Secondary | ICD-10-CM

## 2024-03-18 ENCOUNTER — Other Ambulatory Visit: Payer: Self-pay | Admitting: Family Medicine

## 2024-03-18 DIAGNOSIS — R519 Headache, unspecified: Secondary | ICD-10-CM

## 2024-03-27 ENCOUNTER — Ambulatory Visit (INDEPENDENT_AMBULATORY_CARE_PROVIDER_SITE_OTHER): Payer: Self-pay | Admitting: Urology

## 2024-03-27 VITALS — BP 164/83 | HR 84 | Wt 255.4 lb

## 2024-03-27 DIAGNOSIS — N393 Stress incontinence (female) (male): Secondary | ICD-10-CM

## 2024-03-27 DIAGNOSIS — N3946 Mixed incontinence: Secondary | ICD-10-CM

## 2024-03-27 LAB — URINALYSIS, COMPLETE
Bilirubin, UA: NEGATIVE
Glucose, UA: NEGATIVE
Ketones, UA: NEGATIVE
Leukocytes,UA: NEGATIVE
Nitrite, UA: NEGATIVE
RBC, UA: NEGATIVE
Specific Gravity, UA: 1.025 (ref 1.005–1.030)
Urobilinogen, Ur: 1 mg/dL (ref 0.2–1.0)
pH, UA: 6 (ref 5.0–7.5)

## 2024-03-27 LAB — MICROSCOPIC EXAMINATION: Epithelial Cells (non renal): >10 /HPF — AB (ref 0–10)

## 2024-03-27 MED ORDER — OXYBUTYNIN CHLORIDE ER 10 MG PO TB24: 10.0000 mg | ORAL_TABLET | Freq: Every day | ORAL | 3 refills | Status: DC

## 2024-03-27 NOTE — Progress Notes (Signed)
 03/27/2024 8:36 AM   Miranda Moore 08-05-1961 [id number]  Referring provider: Gasper Nancyann BRAVO, MD [address] [place],  [place] [address]  No chief complaint on file.   HPI: I was consulted to assess the patient's urinary incontinence. She can leak with coughing sneezing and a hard laugh. She does not leak with bending or lifting. Sometimes she has urge incontinence. No bedwetting. Wears 1-2 pads a day that are damp. I think the urge component is more severe since she does not cough a lot. She says when she coughs she loses a small amount she is a Chief Strategy Officer at [employer]  She voids every 3 hours but more frequently in the morning after diuretic. Sometimes he gets up once at night. She was prescribed Kegel exercises. Has had a hysterectomy   Patient has mild mixed incontinence.  Role of urodynamics and physical therapy discussed.  We decide to try to help her with medical therapy.  If this fails we can order urodynamics.  Reassess in 6 weeks on Myrbetriq  50 mg samples and prescription.  She declined physical therapy.  We will order test in future if needed depending on goals    Today Frequency stable.  Urgency incontinence much better on oxybutynin .  Failed Myrbetriq .  Clinically not infected    Today Patient generally wearing 1 liner a day.  She goes on public she might wear 1 thicker pad.  No infections. Urinalysis reviewed and sent for culture     PMH: Past Medical History:  Diagnosis Date   Abortion, spontaneous 05/31/2015   2.      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 - FIBROCYSTIC CHANGES CONSISTING OF DUCTAL EPITHELIAL HYPERPLASIA OF USUAL TYPE, DUCT ECTASIA, STROMAL FIBROSIS AND CALCIFICATIONS.   BREAST BIOPSY Right 10/14/2022   Right Breast Stereo Bx Coil Clip  BREAST, RIGHT, UPPER OUTER MIDDLE; STEREOTACTIC CORE BIOPSY: - FIBROADENOMATOID NODULE WITH CALCIFICATIONS. - FIBROCYSTIC CHANGES CONSISTING OF DUCTAL EPITHELIAL HYPERPLASIA OF USUAL TYPE, DUCT ECTASIA, STROMAL FIBROSIS AND CALCIFICATIONS.   BREAST BIOPSY Right 10/14/2022   MM RT BREAST BX W LOC DEV 1ST LESION IMAGE BX SPEC STEREO GUIDE 10/14/2022 [facility]   BREAST BIOPSY Right 10/14/2022   MM RT BREAST BX W LOC DEV EA AD LESION IMG BX SPEC STEREO GUIDE 10/14/2022 [facility]   CATARACT EXTRACTION W/PHACO Right 02/03/2023   Procedure: CATARACT EXTRACTION PHACO AND INTRAOCULAR LENS PLACEMENT (IOC) RIGHT DIABETIC 2.95 00:24.4;  Surgeon: Mittie Gaskin, MD;  Location: [facility];  Service: Ophthalmology;  Laterality: Right;   CATARACT EXTRACTION W/PHACO Left 02/17/2023   Procedure: CATARACT EXTRACTION PHACO AND INTRAOCULAR LENS PLACEMENT (IOC) LEFT DIABETIC 4.37 00:32.1;  Surgeon: Mittie Gaskin, MD;  Location: [facility];  Service: Ophthalmology;  Laterality: Left;   COLONOSCOPY  2015   COLONOSCOPY WITH PROPOFOL  N/A 06/04/2020   Procedure: COLONOSCOPY WITH BIOPSY;  Surgeon:  Janalyn Keene NOVAK, MD;  Location: [facility];  Service: Endoscopy;  Laterality: N/A;   KNEE SURGERY  2012   LAPAROSCOPIC OOPHORECTOMY  2012   POLYPECTOMY N/A 06/04/2020   Procedure: POLYPECTOMY;  Surgeon: Janalyn Keene NOVAK, MD;  Location: [facility];  Service: Endoscopy;  Laterality: N/A;   WRIST ARTHROSCOPY WITH DEBRIDEMENT Right 08/30/2018   Procedure: WRIST ARTHROSCOPY WITH  DEBRIDEMENT AND SYNOVECTOMY;  Surgeon: Camella Fallow, MD;  Location: [facility];  Service: Orthopedics;  Laterality: Right;   WRIST SURGERY  02/11/2021    Home Medications:  Allergies as of 03/27/2024       Reactions   Chlorpheniramine-phenylephrine  Hives   Elemental Sulfur Other (See Comments)   Low b/p   Other Hives   actifed   Sulfa Antibiotics Other (See Comments)   Sulfur Other (See Comments)   Triprolidine-pseudoephedrine Other (See Comments)        Medication List        Accurate as of March 27, 2024  8:36 AM. If you have any questions, ask your nurse or doctor.          allopurinol  100 MG tablet Commonly known as: ZYLOPRIM  TAKE 1 TABLET BY MOUTH EVERY DAY   ALPRAZolam  0.5 MG tablet Commonly known as: XANAX  TAKE 1/2 - 1 TABLET (0.25 - 0.5 MG TOTAL) BY MOUTH TWICE A DAY AS NEEDED FOR ANXIETY   amLODipine  10 MG tablet Commonly known as: NORVASC  TAKE 1 TABLET DAILY FOR    BLOOD PRESSURE CONTROL   aspirin 81 MG tablet Take 81 mg by mouth daily.   atorvastatin  10 MG tablet Commonly known as: LIPITOR TAKE 1 TABLET DAILY   Calcium  Carbonate-Vitamin D  600-200 MG-UNIT Tabs   colchicine  0.6 MG tablet FOR ACUTE GOUT, TAKE TWO TABLETS ON DAY 1 AND THEN ONE HOUR LATER, TAKE A THIRD TABLET TO TOTAL 1.8 MG DAILY. FOR PREVENTION, TAKE ONE 0.6 MG TABLET DAILY WITH ALLOPURINOL .   Fish Oil Double Strength 1200 MG Caps Take by mouth.   furosemide  20 MG tablet Commonly known as: LASIX  TAKE 1 TABLET (20 MG TOTAL) BY MOUTH DAILY AS NEEDED FOR EDEMA.   hydrochlorothiazide  25 MG tablet Commonly known as: HYDRODIURIL  Take 1 tablet (25 mg total) by mouth daily.   ketorolac  10 MG tablet Commonly known as: TORADOL  Take 1 tablet (10 mg total) by mouth every 6 (six) hours as needed.   methocarbamol  500 MG tablet Commonly known as: ROBAXIN  Take 2 tablets (1,000 mg total) by mouth every 6 (six) hours as needed for muscle spasms.   omeprazole  40 MG  capsule Commonly known as: PRILOSEC TAKE 1 CAPSULE DAILY FOR REFLUX ESOPHAGITIS WITH GASTRITIS   oxybutynin  10 MG 24 hr tablet Commonly known as: DITROPAN -XL Take 1 tablet (10 mg total) by mouth daily.   POTASSIUM CITRATE PO Take by mouth daily.   rizatriptan  10 MG disintegrating tablet Commonly known as: Maxalt -MLT Take 1 tablet (10 mg total) by mouth as needed for migraine. May repeat in 2 hours if needed   sertraline  100 MG tablet Commonly known as: ZOLOFT  TAKE 1 AND 1/2 TABLETS     DAILY   valsartan  160 MG tablet Commonly known as: DIOVAN  TAKE 1 TABLET DAILY FOR BLOOD PRESSURE CONTROL   valsartan -hydrochlorothiazide  160-25 MG tablet Commonly known as: DIOVAN -HCT Take 1 tablet by mouth daily. TAKE IN PLACE OF SEPARATE PRESCRIPTIONS OF VALSARTAN  160 AND hydrochlorothiazide  25   Vitamin D -3 25 MCG (1000 UT) Caps Take by mouth daily.  WOMENS MULTIVITAMIN PO Take by mouth.        Allergies:  Allergies  Allergen Reactions   Chlorpheniramine-Phenylephrine  Hives   Elemental Sulfur Other (See Comments)    Low b/p   Other Hives    actifed   Sulfa Antibiotics Other (See Comments)   Sulfur Other (See Comments)   Triprolidine-Pseudoephedrine Other (See Comments)    Family History: Family History  Problem Relation Age of Onset   Cancer Mother        colon   Heart failure Mother    Heart disease Father    Hypertension Father    Migraines Sister    Sleep apnea Sister    Stroke Brother    Hypertension Brother    Breast cancer Paternal Aunt    Heart failure Paternal Grandmother    Dementia Paternal Grandfather     Social History:  reports that she has never smoked. She has never been exposed to tobacco smoke. She has never used smokeless tobacco. She reports current alcohol use. She reports that she does not use drugs.  ROS:                                        Physical Exam: There were no vitals taken for this visit.   Constitutional:  Alert and oriented, No acute distress. HEENT: [place] AT, moist mucus membranes.  Trachea midline, no masses.  Laboratory Data: Lab Results  Component Value Date   WBC 7.2 10/27/2022   HGB 11.7 10/27/2022   HCT 37.4 10/27/2022   MCV 77 (L) 10/27/2022   PLT 241 10/27/2022    Lab Results  Component Value Date   CREATININE 0.83 10/26/2023    No results found for: PSA  No results found for: TESTOSTERONE  Lab Results  Component Value Date   HGBA1C 7.7 (H) 10/26/2023    Urinalysis    Component Value Date/Time   APPEARANCEUR Clear 03/29/2023 0935   GLUCOSEU Negative 03/29/2023 0935   BILIRUBINUR Negative 03/29/2023 0935   PROTEINUR Negative 03/29/2023 0935   UROBILINOGEN 0.2 03/19/2017 1446   NITRITE Negative 03/29/2023 0935   LEUKOCYTESUR Negative 03/29/2023 0935    Pertinent Imaging: Urine reviewed and sent for culture  Assessment & Plan: I offered a trial of Vesicare or Gemtesa but patient would like to stay on the oxybutynin  as a partial responder.  90 x 3 sent to pharmacy and I will see in 1 year  1. Stress incontinence (Primary)    No follow-ups on file.  Glendia DELENA Elizabeth, MD  [facility] [address], [address] [place], [place] [address] [phone]

## 2024-03-29 ENCOUNTER — Other Ambulatory Visit: Payer: Self-pay | Admitting: Physician Assistant

## 2024-03-29 DIAGNOSIS — I10 Essential (primary) hypertension: Secondary | ICD-10-CM

## 2024-03-29 NOTE — Telephone Encounter (Signed)
 Patient is overdue for office visit and needs to schedule appointment before refill can be approved.

## 2024-03-30 ENCOUNTER — Other Ambulatory Visit: Payer: Self-pay | Admitting: Family Medicine

## 2024-03-30 LAB — CULTURE, URINE COMPREHENSIVE

## 2024-03-31 MED ORDER — AMLODIPINE BESYLATE 10 MG PO TABS: 10.0000 mg | ORAL_TABLET | Freq: Every day | ORAL | 0 refills | Status: DC

## 2024-03-31 NOTE — Addendum Note (Signed)
 Addended by: GASPER NANCYANN BRAVO on: 03/31/2024 01:03 PM   Modules accepted: Orders

## 2024-03-31 NOTE — Telephone Encounter (Signed)
 Requested medication (s) are due for refill today - yes  Requested medication (s) are on the active medication list -yes  Future visit scheduled -no  Last refill: 09/16/23 #40 1RF  Notes to clinic: non delegated Rx  Requested Prescriptions  Pending Prescriptions Disp Refills   methocarbamol  (ROBAXIN ) 500 MG tablet [Pharmacy Med Name: METHOCARBAMOL  500 MG TABLET] 40 tablet 1    Sig: Take 2 tablets (1,000 mg total) by mouth every 6 (six) hours as needed for muscle spasms.     Not Delegated - Analgesics:  Muscle Relaxants Failed - 03/31/2024  2:52 PM      Failed - This refill cannot be delegated      Passed - Valid encounter within last 6 months    Recent Outpatient Visits           5 months ago Type 2 diabetes mellitus without complication, without long-term current use of insulin (HCC)   [place] [facility] Gasper Nancyann BRAVO, MD       Future Appointments             In 1 year MacDiarmid, Scott, MD [facility] [place]               Requested Prescriptions  Pending Prescriptions Disp Refills   methocarbamol  (ROBAXIN ) 500 MG tablet [Pharmacy Med Name: METHOCARBAMOL  500 MG TABLET] 40 tablet 1    Sig: Take 2 tablets (1,000 mg total) by mouth every 6 (six) hours as needed for muscle spasms.     Not Delegated - Analgesics:  Muscle Relaxants Failed - 03/31/2024  2:52 PM      Failed - This refill cannot be delegated      Passed - Valid encounter within last 6 months    Recent Outpatient Visits           5 months ago Type 2 diabetes mellitus without complication, without long-term current use of insulin (HCC)   [place] [facility] Gasper, Nancyann BRAVO, MD       Future Appointments             In 1 year MacDiarmid, Glendia, MD [facility] [place]

## 2024-04-27 ENCOUNTER — Other Ambulatory Visit: Payer: Self-pay

## 2024-04-27 ENCOUNTER — Telehealth: Payer: Self-pay | Admitting: Family Medicine

## 2024-04-27 DIAGNOSIS — F418 Other specified anxiety disorders: Secondary | ICD-10-CM

## 2024-04-27 MED ORDER — SERTRALINE HCL 100 MG PO TABS
150.0000 mg | ORAL_TABLET | Freq: Every day | ORAL | 1 refills | Status: AC
Start: 2024-04-27 — End: ?

## 2024-04-27 NOTE — Telephone Encounter (Signed)
 [facility] is asking for refills onk Sertraline  tab 100 mg. #135 with refills

## 2024-05-08 ENCOUNTER — Ambulatory Visit: Payer: Self-pay

## 2024-05-08 ENCOUNTER — Encounter: Payer: Self-pay | Admitting: Family Medicine

## 2024-05-08 ENCOUNTER — Ambulatory Visit (INDEPENDENT_AMBULATORY_CARE_PROVIDER_SITE_OTHER): Admitting: Family Medicine

## 2024-05-08 VITALS — BP 145/81 | HR 92 | Resp 16 | Ht 69.0 in | Wt 261.5 lb

## 2024-05-08 DIAGNOSIS — M25471 Effusion, right ankle: Secondary | ICD-10-CM

## 2024-05-08 NOTE — Progress Notes (Signed)
 Established patient visit   Patient: Miranda Moore   DOB: 19-Dec-1960   63 y.o. Female  MRN: [id number] Visit Date: 05/08/2024  Today's healthcare provider: Nancyann Perry, MD   Chief Complaint  Patient presents with   Insect Bite    R ankle insect bite; complains of tenderness with bumps, swelling onset 1 week    Subjective    Discussed the use of AI scribe software for clinical note transcription with the patient, who gave verbal consent to proceed.  History of Present Illness   Miranda Moore is a 63 year old female who presents with a swollen and tender spot on her right ankle.  She has experienced a swollen and tender spot on her ankle for about a week. Initially perceived as a mosquito bite, the condition has not improved. The area is swollen, particularly when wearing shoes, and is tender to touch. She describes the sensation as 'itching like a mosquito bite' and notes the presence of 'three little knots' in the area.  She has been treating the area by rubbing it with alcohol. She has not used any cortisone cream. Walking causes discomfort, especially when wearing shoes that rub against the area. She has been mostly staying indoors and noticed the discomfort when wearing tennis shoes that rubbed against the spot.  She is concerned about the possibility of a brown recluse spider bite. She has been keeping her ankle elevated while sitting at work to manage the swelling.       Medications: Outpatient Medications Prior to Visit  Medication Sig   allopurinol  (ZYLOPRIM ) 100 MG tablet TAKE 1 TABLET BY MOUTH EVERY DAY   ALPRAZolam  (XANAX ) 0.5 MG tablet TAKE 1/2 - 1 TABLET (0.25 - 0.5 MG TOTAL) BY MOUTH TWICE A DAY AS NEEDED FOR ANXIETY   amLODipine  (NORVASC ) 10 MG tablet Take 1 tablet (10 mg total) by mouth daily.   aspirin 81 MG tablet Take 81 mg by mouth daily.    atorvastatin  (LIPITOR) 10 MG tablet TAKE 1 TABLET DAILY   Calcium  Carbonate-Vitamin D  600-200 MG-UNIT TABS     Cholecalciferol (VITAMIN D -3) 1000 UNITS CAPS Take by mouth daily.   colchicine  0.6 MG tablet FOR ACUTE GOUT, TAKE TWO TABLETS ON DAY 1 AND THEN ONE HOUR LATER, TAKE A THIRD TABLET TO TOTAL 1.8 MG DAILY. FOR PREVENTION, TAKE ONE 0.6 MG TABLET DAILY WITH ALLOPURINOL .   furosemide  (LASIX ) 20 MG tablet TAKE 1 TABLET (20 MG TOTAL) BY MOUTH DAILY AS NEEDED FOR EDEMA.   ketorolac  (TORADOL ) 10 MG tablet Take 1 tablet (10 mg total) by mouth every 6 (six) hours as needed.   Multiple Vitamins-Minerals (WOMENS MULTIVITAMIN PO) Take by mouth.   Omega-3 Fatty Acids (FISH OIL DOUBLE STRENGTH) 1200 MG CAPS Take by mouth.   omeprazole  (PRILOSEC) 40 MG capsule TAKE 1 CAPSULE DAILY FOR REFLUX ESOPHAGITIS WITH GASTRITIS   oxybutynin  (DITROPAN -XL) 10 MG 24 hr tablet Take 1 tablet (10 mg total) by mouth daily.   rizatriptan  (MAXALT -MLT) 10 MG disintegrating tablet Take 1 tablet (10 mg total) by mouth as needed for migraine. May repeat in 2 hours if needed   sertraline  (ZOLOFT ) 100 MG tablet Take 1.5 tablets (150 mg total) by mouth daily.   valsartan -hydrochlorothiazide  (DIOVAN -HCT) 160-25 MG tablet Take 1 tablet by mouth daily. TAKE IN PLACE OF SEPARATE PRESCRIPTIONS OF VALSARTAN  160 AND hydrochlorothiazide  25   [DISCONTINUED] POTASSIUM CITRATE PO Take by mouth daily.   [DISCONTINUED] methocarbamol  (ROBAXIN ) 500 MG tablet TAKE  2 TABLETS (1,000 MG TOTAL) BY MOUTH EVERY 6 (SIX) HOURS AS NEEDED FOR MUSCLE SPASMS. (Patient not taking: Reported on 05/08/2024)   Facility-Administered Medications Prior to Visit  Medication Dose Route Frequency Provider   promethazine  (PHENERGAN ) injection 25 mg  25 mg Intramuscular Q6H PRN Cyndi Shaver, PA-C   Review of Systems     Objective    BP (!) 145/81   Pulse 92   Resp 16   Ht 5' 9 (1.753 m)   Wt 261 lb 8 oz (118.6 kg)   SpO2 99%   BMI 38.62 kg/m   Physical Exam      Assessment & Plan          Localized inflammatory skin reaction of right ankle Localized  inflammatory reaction on right ankle, likely from insect bite. No infection or systemic involvement. Differential diagnosis excludes brown recluse spider bite. - Apply ice pack to reduce inflammation. - Cover with Band-Aid to prevent irritation. - Elevate ankle to reduce swelling. - Monitor for infection or worsening symptoms.         Nancyann Perry, MD  [facility] [phone] (phone) [phone] (fax)  [facility]

## 2024-05-08 NOTE — Telephone Encounter (Signed)
 FYI Only or Action Required?: FYI only for provider.  Patient was last seen in primary care on 10/26/2023 by Gasper Nancyann BRAVO, MD.  Called Nurse Triage reporting Insect Bite.  Symptoms began several days ago.  Interventions attempted: Nothing.  Symptoms are: gradually worsening.  Triage Disposition: See Physician Within 24 Hours  Patient/caregiver understands and will follow disposition?: Yes        Copied from CRM [id number]. Topic: Clinical - Red Word Triage >> May 08, 2024  7:59 AM Precious C wrote: [facility] that prompted transfer to Nurse Triage: INSECT BITES   Patient called in regarding a spider bite on her right leg that occurred last Wednesday. She reported that there is some pus no drainage, more like blister ...the area and that it feels tender and painful to the touch. Patient rated her pain as 4/10. Reason for Disposition  [1] Red or very tender (to touch) area AND [2] getting larger over 48 hours after the bite  Answer Assessment - Initial Assessment Questions 1. TYPE of SPIDER: What type of spider was it?  (e.g., name, unknown, or brief description)     Unknown  2. LOCATION: Where is the bite located?      R leg - outer ankle 3. PAIN: Is there any pain? If Yes, ask: How bad is it?  (Scale 1-10; or mild, moderate, severe)     4/10 Endorses painful to touch 4. SWELLING: How big is the swelling? (Inches, cm or compare to coins)      A little swollen 5. ONSET: When did the bite occur? (e.g., minutes, hours ago)      Wednesday 6. TETANUS: When was your last tetanus booster?      *No Answer* 7. OTHER SYMPTOMS: Do you have any other symptoms?  (e.g., muscle cramps, abdomen pain, change in urine color)     denies  Protocols used: Spider Bite - [employer]

## 2024-06-27 ENCOUNTER — Telehealth: Payer: Self-pay | Admitting: Family Medicine

## 2024-06-27 DIAGNOSIS — I10 Essential (primary) hypertension: Secondary | ICD-10-CM

## 2024-06-27 NOTE — Telephone Encounter (Signed)
 Patient is overdue for office visit and needs to schedule before refill can be approved.

## 2024-06-27 NOTE — Telephone Encounter (Signed)
Patient scheduled through mychart.

## 2024-07-12 ENCOUNTER — Encounter: Payer: Self-pay | Admitting: Family Medicine

## 2024-07-12 ENCOUNTER — Telehealth (INDEPENDENT_AMBULATORY_CARE_PROVIDER_SITE_OTHER): Admitting: Family Medicine

## 2024-07-12 DIAGNOSIS — J3489 Other specified disorders of nose and nasal sinuses: Secondary | ICD-10-CM

## 2024-07-12 DIAGNOSIS — J069 Acute upper respiratory infection, unspecified: Secondary | ICD-10-CM | POA: Diagnosis not present

## 2024-07-12 DIAGNOSIS — R519 Headache, unspecified: Secondary | ICD-10-CM

## 2024-07-12 DIAGNOSIS — R509 Fever, unspecified: Secondary | ICD-10-CM

## 2024-07-12 DIAGNOSIS — R0981 Nasal congestion: Secondary | ICD-10-CM

## 2024-07-12 DIAGNOSIS — J029 Acute pharyngitis, unspecified: Secondary | ICD-10-CM | POA: Diagnosis not present

## 2024-07-12 LAB — POC COVID19 BINAXNOW: SARS Coronavirus 2 Ag: NEGATIVE

## 2024-07-12 LAB — POCT RAPID STREP A (OFFICE): Rapid Strep A Screen: NEGATIVE

## 2024-07-12 LAB — POCT INFLUENZA A/B
Influenza A, POC: NEGATIVE
Influenza B, POC: NEGATIVE

## 2024-07-12 MED ORDER — PREDNISONE 20 MG PO TABS: ORAL_TABLET | ORAL | 0 refills | Status: DC

## 2024-07-12 NOTE — Patient Instructions (Signed)
 To keep you healthy, please keep in mind the following health maintenance items that you are due for:   Health Maintenance Due  Topic Date Due   Medicare Annual Wellness (AWV)  Never done   HIV Screening  Never done   Pneumococcal Vaccine: 50+ Years (1 of 2 - PCV) Never done   Zoster Vaccines- Shingrix (1 of 2) Never done   DTaP/Tdap/Td (2 - Td or Tdap) 05/27/2021   Influenza Vaccine  03/10/2024   COVID-19 Vaccine (4 - 2025-26 season) 04/10/2024   HEMOGLOBIN A1C  04/27/2024     Best Wishes,   Dr. Lang

## 2024-07-12 NOTE — Progress Notes (Addendum)
 ACUTE VISIT   Patient: Miranda Moore   DOB: 10-04-1960   63 y.o. Female  MRN: [id number]   PCP: Gasper Nancyann BRAVO, MD  Chief Complaint  Patient presents with   Sore Throat    Fevers, chills,    Subjective    HPI HPI     Sore Throat    Additional comments: Fevers, chills,       Last edited by Sharma Coyer, MD on 07/12/2024  9:17 AM.       Discussed the use of AI scribe software for clinical note transcription with the patient, who gave verbal consent to proceed.  History of Present Illness Miranda Moore is a 63 year old female with diabetes, obstructive sleep apnea, hypertension, and hypercholesterolemia who presents with sore throat and headache.  Symptoms began on Thursday, Thanksgiving day, with a sore throat and headache. She has been using cough drops frequently to alleviate the sore throat, which has improved, but she now experiences significant nasal congestion and rhinorrhea. She uses a large amount of paper towels due to the nasal drainage.  She feels feverish with chills and can only breathe comfortably with a fan on. No history of asthma or COPD, but she experiences dyspnea after walking short distances. She has not taken any COVID or flu tests at home.  She denies having a cough but has a history of migraine headaches for which she takes ketorolac  and Xanax . She has been experiencing these symptoms for about a week, and her family comments that she 'sounds awful'.  She uses a CPAP machine for her obstructive sleep apnea and reports waking up with xerostomia. She has not used nasal saline to alleviate her congestion.  She has been using Vicks cough drops and Mucinex every four hours, which has helped with the drainage.     Medications: Outpatient Medications Prior to Visit  Medication Sig   allopurinol  (ZYLOPRIM ) 100 MG tablet TAKE 1 TABLET BY MOUTH EVERY DAY   ALPRAZolam  (XANAX ) 0.5 MG tablet TAKE 1/2 - 1 TABLET (0.25 - 0.5 MG TOTAL) BY  MOUTH TWICE A DAY AS NEEDED FOR ANXIETY   amLODipine  (NORVASC ) 10 MG tablet Take 1 tablet (10 mg total) by mouth daily.   aspirin 81 MG tablet Take 81 mg by mouth daily.    atorvastatin  (LIPITOR) 10 MG tablet TAKE 1 TABLET DAILY   Calcium  Carbonate-Vitamin D  600-200 MG-UNIT TABS    Cholecalciferol (VITAMIN D -3) 1000 UNITS CAPS Take by mouth daily.   colchicine  0.6 MG tablet FOR ACUTE GOUT, TAKE TWO TABLETS ON DAY 1 AND THEN ONE HOUR LATER, TAKE A THIRD TABLET TO TOTAL 1.8 MG DAILY. FOR PREVENTION, TAKE ONE 0.6 MG TABLET DAILY WITH ALLOPURINOL .   furosemide  (LASIX ) 20 MG tablet TAKE 1 TABLET (20 MG TOTAL) BY MOUTH DAILY AS NEEDED FOR EDEMA.   ketorolac  (TORADOL ) 10 MG tablet Take 1 tablet (10 mg total) by mouth every 6 (six) hours as needed.   Multiple Vitamins-Minerals (WOMENS MULTIVITAMIN PO) Take by mouth.   Omega-3 Fatty Acids (FISH OIL DOUBLE STRENGTH) 1200 MG CAPS Take by mouth.   omeprazole  (PRILOSEC) 40 MG capsule TAKE 1 CAPSULE DAILY FOR REFLUX ESOPHAGITIS WITH GASTRITIS   oxybutynin  (DITROPAN -XL) 10 MG 24 hr tablet Take 1 tablet (10 mg total) by mouth daily.   rizatriptan  (MAXALT -MLT) 10 MG disintegrating tablet Take 1 tablet (10 mg total) by mouth as needed for migraine. May repeat in 2 hours if needed   sertraline  (ZOLOFT )  100 MG tablet Take 1.5 tablets (150 mg total) by mouth daily.   valsartan -hydrochlorothiazide  (DIOVAN -HCT) 160-25 MG tablet Take 1 tablet by mouth daily. TAKE IN PLACE OF SEPARATE PRESCRIPTIONS OF VALSARTAN  160 AND hydrochlorothiazide  25   Facility-Administered Medications Prior to Visit  Medication Dose Route Frequency Provider   promethazine  (PHENERGAN ) injection 25 mg  25 mg Intramuscular Q6H PRN Cyndi Shaver, PA-C        Objective    There were no vitals taken for this visit. BP Readings from Last 3 Encounters:  05/08/24 (!) 145/81  03/27/24 (!) 164/83  10/26/23 (!) 147/70   Wt Readings from Last 3 Encounters:  05/08/24 261 lb 8 oz (118.6 kg)   03/27/24 255 lb 6.4 oz (115.8 kg)  10/26/23 268 lb (121.6 kg)        Physical Exam   Physical Exam  Pt later evaluated in office  PULM: no wheezing, no crackles,normal respiratory effort on RA  CARD: murmur present (known)    Results for orders placed or performed in visit on 07/12/24  POCT rapid strep A  Result Value Ref Range   Rapid Strep A Screen Negative Negative  POC COVID-19  Result Value Ref Range   SARS Coronavirus 2 Ag Negative Negative  POCT Influenza A/B  Result Value Ref Range   Influenza A, POC Negative Negative   Influenza B, POC Negative Negative    Assessment & Plan     Assessment and Plan Assessment & Plan Acute upper respiratory infection Symptoms include sore throat, headache, congestion, rhinorrhea, fever, chills, and dyspnea. Symptoms have persisted for approximately one week. Differential diagnosis includes influenza, COVID-19, and strep pharyngitis. No cough reported. No sick contacts at home. Symptoms slightly improving but still present. No asthma or COPD. CPAP use for obstructive sleep apnea. No current antibiotic treatment warranted. - Ordered influenza and COVID-19 tests. - Ordered strep test. - If influenza positive, will treat with Tamiflu. - If COVID-19 positive, will prescribe antiviral medication. - All tests were  negative in office - recommended prednisone  taper 40mg  for three days and 20mg  daily for 3 days to help with symptoms - If symptoms not improved by Friday, will prescribe amoxicillin 875 mg twice daily for 7 days for suspected acute sinus infection. - Advised hydration, rest, and avoidance of spicy and crunchy foods. - Recommended over-the-counter medications without pseudoephedrine. - Suggested nasal saline for congestion relief.      Return in about 5 days (around 07/17/2024), or if symptoms worsen or fail to improve.      Pt was initially scheduled for virtual visit. We were not able to complete virtual video visit.  History was obtained via telephone and patient came into the clinic for testing and evaluation.   Rockie Agent, MD  [facility] [phone] (phone) [phone] (fax)  [facility]

## 2024-07-17 ENCOUNTER — Ambulatory Visit: Admitting: Family Medicine

## 2024-07-17 ENCOUNTER — Encounter: Payer: Self-pay | Admitting: Family Medicine

## 2024-07-17 VITALS — BP 148/74 | HR 88 | Ht 69.0 in | Wt 259.4 lb

## 2024-07-17 DIAGNOSIS — E119 Type 2 diabetes mellitus without complications: Secondary | ICD-10-CM

## 2024-07-17 DIAGNOSIS — I10 Essential (primary) hypertension: Secondary | ICD-10-CM

## 2024-07-17 DIAGNOSIS — E7800 Pure hypercholesterolemia, unspecified: Secondary | ICD-10-CM

## 2024-07-17 DIAGNOSIS — Z23 Encounter for immunization: Secondary | ICD-10-CM | POA: Diagnosis not present

## 2024-07-17 DIAGNOSIS — F418 Other specified anxiety disorders: Secondary | ICD-10-CM | POA: Diagnosis not present

## 2024-07-17 DIAGNOSIS — F419 Anxiety disorder, unspecified: Secondary | ICD-10-CM | POA: Diagnosis not present

## 2024-07-17 DIAGNOSIS — E1169 Type 2 diabetes mellitus with other specified complication: Secondary | ICD-10-CM | POA: Diagnosis not present

## 2024-07-17 DIAGNOSIS — E559 Vitamin D deficiency, unspecified: Secondary | ICD-10-CM

## 2024-07-17 DIAGNOSIS — G43809 Other migraine, not intractable, without status migrainosus: Secondary | ICD-10-CM

## 2024-07-17 DIAGNOSIS — F4310 Post-traumatic stress disorder, unspecified: Secondary | ICD-10-CM

## 2024-07-17 DIAGNOSIS — E785 Hyperlipidemia, unspecified: Secondary | ICD-10-CM

## 2024-07-17 DIAGNOSIS — R519 Headache, unspecified: Secondary | ICD-10-CM

## 2024-07-17 LAB — POCT GLYCOSYLATED HEMOGLOBIN (HGB A1C)
Est. average glucose Bld gHb Est-mCnc: 148
Hemoglobin A1C: 6.8 % — AB (ref 4.0–5.6)

## 2024-07-17 MED ORDER — AMLODIPINE BESYLATE 10 MG PO TABS: 10.0000 mg | ORAL_TABLET | Freq: Every day | ORAL | 1 refills | Status: AC

## 2024-07-17 MED ORDER — KETOROLAC TROMETHAMINE 10 MG PO TABS: 10.0000 mg | ORAL_TABLET | Freq: Four times a day (QID) | ORAL | 1 refills | Status: DC | PRN

## 2024-07-17 NOTE — Patient Instructions (Addendum)
Please review the attached list of medications and notify my office if there are any errors.   I recommend getting the updated Covid vaccine at your local pharmacy

## 2024-07-19 ENCOUNTER — Telehealth: Payer: Self-pay | Admitting: Family Medicine

## 2024-07-19 ENCOUNTER — Other Ambulatory Visit: Payer: Self-pay | Admitting: Family Medicine

## 2024-07-19 DIAGNOSIS — K2100 Gastro-esophageal reflux disease with esophagitis, without bleeding: Secondary | ICD-10-CM

## 2024-07-19 DIAGNOSIS — M109 Gout, unspecified: Secondary | ICD-10-CM

## 2024-07-19 DIAGNOSIS — R519 Headache, unspecified: Secondary | ICD-10-CM

## 2024-07-19 DIAGNOSIS — R600 Localized edema: Secondary | ICD-10-CM

## 2024-07-19 DIAGNOSIS — F418 Other specified anxiety disorders: Secondary | ICD-10-CM

## 2024-07-19 DIAGNOSIS — E1169 Type 2 diabetes mellitus with other specified complication: Secondary | ICD-10-CM

## 2024-07-19 DIAGNOSIS — I10 Essential (primary) hypertension: Secondary | ICD-10-CM

## 2024-07-19 MED ORDER — ATORVASTATIN CALCIUM 10 MG PO TABS: 10.0000 mg | ORAL_TABLET | Freq: Every day | ORAL | 4 refills | Status: AC

## 2024-07-19 MED ORDER — FUROSEMIDE 20 MG PO TABS: 20.0000 mg | ORAL_TABLET | Freq: Every day | ORAL | 3 refills | Status: DC | PRN

## 2024-07-19 MED ORDER — ATORVASTATIN CALCIUM 10 MG PO TABS: 10.0000 mg | ORAL_TABLET | Freq: Every day | ORAL | 4 refills | Status: DC

## 2024-07-19 MED ORDER — FUROSEMIDE 20 MG PO TABS: 20.0000 mg | ORAL_TABLET | Freq: Every day | ORAL | 3 refills | Status: AC | PRN

## 2024-07-19 NOTE — Telephone Encounter (Addendum)
 [facility] faxed refill request for the following medications:   omeprazole  (PRILOSEC) 40 MG capsule    atorvastatin  (LIPITOR) 10 MG tablet    allopurinol  (ZYLOPRIM ) 100 MG tablet    colchicine  0.6 MG tablet    ALPRAZolam  (XANAX ) 0.5 MG tablet    Please advise.

## 2024-07-19 NOTE — Telephone Encounter (Addendum)
 [facility] faxed refill request for the following medications:   furosemide  (LASIX ) 20 MG tablet   amLODipine  (NORVASC ) 10 MG tablet   Methocarbamol  500mg  tablet   valsartan -hydrochlorothiazide  (DIOVAN -HCT) 160-25 MG tablet   ketorolac  (TORADOL ) 10 MG tablet   rizatriptan  (MAXALT -MLT) 10 MG disintegrating tablet    Please advise.

## 2024-07-19 NOTE — Telephone Encounter (Signed)
Noted. Duplicate encounter

## 2024-07-19 NOTE — Telephone Encounter (Signed)
 Ordered them on the other encounter.

## 2024-07-21 ENCOUNTER — Telehealth: Payer: Self-pay

## 2024-07-21 ENCOUNTER — Other Ambulatory Visit: Payer: Self-pay

## 2024-07-21 DIAGNOSIS — N393 Stress incontinence (female) (male): Secondary | ICD-10-CM

## 2024-07-21 MED ORDER — OXYBUTYNIN CHLORIDE ER 10 MG PO TB24: 10.0000 mg | ORAL_TABLET | Freq: Every day | ORAL | 3 refills | Status: AC

## 2024-07-21 NOTE — Telephone Encounter (Signed)
 Patient request RX sent to mail order

## 2024-07-28 DIAGNOSIS — G43909 Migraine, unspecified, not intractable, without status migrainosus: Secondary | ICD-10-CM | POA: Insufficient documentation

## 2024-07-28 NOTE — Progress Notes (Unsigned)
 "     Established patient visit   Patient: Miranda Moore   DOB: 11-Aug-1960   63 y.o. Female  MRN: [id number] Visit Date: 07/17/2024  Today's healthcare provider: Nancyann Perry, MD   Chief Complaint  Patient presents with   Follow-up    Medication follow up/ refill .   Hypertension   Subjective    Discussed the use of AI scribe software for clinical note transcription with the patient, who gave verbal consent to proceed.  History of Present Illness   Miranda Moore is a 63 year old female who presents for a checkup and medication management.  She has been experiencing migraines, which she manages with Toradol  and Xanax . Recently, she had a persistent headache lasting seven days, during which she used Toradol  and Xanax  for pain relief. She also took two Zoloft  and two Xanax  in an attempt to alleviate symptoms. She is seeking documentation for her migraine treatment to provide to the [place].  She is currently taking sertraline  and Xanax  for PTSD and tinnitus, conditions for which she has been approved for benefits through the [place]. She is also on colchicine  for gout, using it as needed.  Her blood pressure has been elevated, and she is on valsartan  HCTZ, amlodipine , and furosemide  for hypertension. She does not monitor her blood pressure at home. She recently completed a course of prednisone .  She has a history of elevated cholesterol and is on medication for this condition. No current issues with breathing, shortness of breath, or chest pain.  She recently experienced flu-like symptoms but tested negative for flu, COVID, and strep. She completed a course of prednisone , taking 40 mg daily for three days followed by 20 mg daily for three days.  She is preparing for a cruise. She has not yet received the COVID vaccine.  She uses a CPAP machine for sleep apnea, which is over five years old and causing dryness and epistaxis. Her mouth is dry upon waking, and she sometimes keeps a cup of water  by  her bed. She is seeking a new CPAP machine as her current one is outdated.     Lab Results  Component Value Date   HGBA1C 6.8 (A) 07/17/2024   HGBA1C 7.7 (H) 10/26/2023   HGBA1C 6.6 (A) 05/28/2023   Lab Results  Component Value Date   VD25OH 45.1 10/26/2023   Lab Results  Component Value Date   CHOL 173 10/26/2023   HDL 37 (L) 10/26/2023   LDLCALC 115 (H) 10/26/2023   TRIG 116 10/26/2023   CHOLHDL 4.7 (H) 10/26/2023   Lab Results  Component Value Date   NA 142 10/26/2023   K 3.9 10/26/2023   CREATININE 0.83 10/26/2023   EGFR 80 10/26/2023   GLUCOSE 137 (H) 10/26/2023     Medications: Show/hide medication list[1] Review of Systems  Constitutional:  Negative for appetite change, chills, fatigue and fever.  Respiratory:  Negative for chest tightness and shortness of breath.   Cardiovascular:  Negative for chest pain and palpitations.  Gastrointestinal:  Negative for abdominal pain, nausea and vomiting.  Neurological:  Negative for dizziness and weakness.      Objective    BP (!) 148/74 (BP Location: Left Arm, Patient Position: Sitting)   Pulse 88   Ht 5' 9 (1.753 m)   Wt 259 lb 6.4 oz (117.7 kg)   SpO2 97%   BMI 38.31 kg/m   Physical Exam   General: Appearance:    Mildly obese female in no acute  distress  Eyes:    PERRL, conjunctiva/corneas clear, EOM's intact       Lungs:     Clear to auscultation bilaterally, respirations unlabored  Heart:    Normal heart rate. Normal rhythm.  3/6 systolic murmur   MS:   All extremities are intact.    Neurologic:   Awake, alert, oriented x 3. No apparent focal neurological defect.         Results for orders placed or performed in visit on 07/17/24  POCT glycosylated hemoglobin (Hb A1C)  Result Value Ref Range   Hemoglobin A1C 6.8 (A) 4.0 - 5.6 %   Est. average glucose Bld gHb Est-mCnc 148      Assessment & Plan       Essential hypertension Blood pressure slightly elevated, possibly due to recent prednisone  use.  On valsartan  HCTZ, amlodipine , and furosemide . - Sent prescription for [facility]ipine  to [facility].  Type 2 diabetes mellitus A1c improved to 6.8, indicating good glycemic control.  Migraine and chronic headache Chronic headaches managed with Toradol  and Xanax . Recent episode managed with Zoloft  and Xanax . Requires documentation for [facility] benefits. - [facility] prescription [facility]ol  to [facility].  Pure hypercholesterolemia Continues on cholesterol medication.  Obstructive sleep apnea Current CPAP machine over five years old causing dryness and epistaxis. Requires new machine with automatic pressure and humidity adjustment. - Ordered new CPAP machine with automatic pressure and humidity adjustment.  Encounter for immunization Discussed upcoming cruise and need for vaccinations. Plans to get flu shot before cruise. COVID vaccine recommended but not available at this facility. - Recommended COVID vaccine from pharmacy.    Return in about 5 months (around 12/15/2024) for Diabetes, Hypertension.   - ketorolac  (TORADOL ) 10 MG tablet; Take 1 tablet (10 mg total) by mouth every 6 (six) hours as needed.  Dispense: 20 tablet; Refill: 1  Depression with anxiety Doing well with sertraline .   Type 2 diabetes mellitus associated with hyperlipidemia She is tolerating atorvastatin  well with no adverse effects.    Obesity, morbid (HCC) Working on eating healthier and increase physical activity for weight loss.   Avitaminosis D Continue daily vitamin D  suppelemnt.   Pure hypercholesterolemia She is tolerating atorvastatin  well with no adverse effects.    Anxiety Stable on current dose alprazolam  and sertraline .   PTSD (post-traumatic stress disorder) Doing well on SSRI  Flu vaccine Immunization due - Flu vaccine trivalent PF, 6mos and older(Flulaval,Afluria,Fluarix,Fluzone)     Future Appointments  Date Time Provider Department Center  12/15/2024  1:20 PM Gasper Nancyann BRAVO, MD [facility] [place]  04/02/2025   9:00 AM Gaston Hamilton, MD [facility] None     Nancyann Gasper, MD  [facility] [phone] (phone) [phone] (fax)  [facility]     [1]  Outpatient Medications Prior to Visit  Medication Sig   allopurinol  (ZYLOPRIM ) 100 MG tablet TAKE 1 TABLET BY MOUTH EVERY DAY   ALPRAZolam  (XANAX ) 0.5 MG tablet TAKE 1/2 - 1 TABLET (0.25 - 0.5 MG TOTAL) BY MOUTH TWICE A DAY AS NEEDED FOR ANXIETY   aspirin 81 MG tablet Take 81 mg by mouth daily.    Calcium  Carbonate-Vitamin D  600-200 MG-UNIT TABS    Cholecalciferol (VITAMIN D -3) 1000 UNITS CAPS Take by mouth daily.   colchicine  0.6 MG tablet FOR ACUTE GOUT, TAKE TWO TABLETS ON DAY 1 AND THEN ONE HOUR LATER, TAKE A THIRD TABLET TO TOTAL 1.8 MG DAILY. FOR PREVENTION, TAKE ONE 0.6 MG TABLET DAILY WITH ALLOPURINOL .   Multiple  Vitamins-Minerals (WOMENS MULTIVITAMIN PO) Take by mouth.   Omega-3 Fatty Acids (FISH OIL DOUBLE STRENGTH) 1200 MG CAPS Take by mouth.   omeprazole  (PRILOSEC) 40 MG capsule TAKE 1 CAPSULE DAILY FOR REFLUX ESOPHAGITIS WITH GASTRITIS   rizatriptan  (MAXALT -MLT) 10 MG disintegrating tablet Take 1 tablet (10 mg total) by mouth as needed for migraine. May repeat in 2 hours if needed   sertraline  (ZOLOFT ) 100 MG tablet Take 1.5 tablets (150 mg total) by mouth daily.   valsartan -hydrochlorothiazide  (DIOVAN -HCT) 160-25 MG tablet Take 1 tablet by mouth daily. TAKE IN PLACE OF SEPARATE PRESCRIPTIONS OF VALSARTAN  160 AND hydrochlorothiazide  25   [DISCONTINUED] amLODipine  (NORVASC ) 10 MG tablet Take 1 tablet (10 mg total) by mouth daily.   [DISCONTINUED] atorvastatin  (LIPITOR) 10 MG tablet TAKE 1 TABLET DAILY   [DISCONTINUED] furosemide  (LASIX ) 20 MG tablet TAKE 1 TABLET (20 MG TOTAL) BY MOUTH DAILY AS NEEDED FOR EDEMA.   [DISCONTINUED] ketorolac  (TORADOL ) 10 MG tablet Take 1 tablet (10 mg total) by mouth every 6 (six) hours as needed.   [DISCONTINUED] oxybutynin  (DITROPAN -XL) 10 MG 24 hr tablet Take 1  tablet (10 mg total) by mouth daily.   [DISCONTINUED] predniSONE  (DELTASONE ) 20 MG tablet Take 2 tablets (40 mg total) by mouth daily with breakfast for 3 days, THEN 1 tablet (20 mg total) daily with breakfast for 3 days.   Facility-Administered Medications Prior to Visit  Medication Dose Route Frequency Provider   promethazine  (PHENERGAN ) injection 25 mg  25 mg Intramuscular Q6H PRN Cyndi Shaver, PA-C   "

## 2024-07-28 NOTE — Progress Notes (Unsigned)
 CPAP auto-titrate 4-20cm order for [facility] completed and sent to medical records to fax on 07/28/2024

## 2024-08-02 ENCOUNTER — Other Ambulatory Visit: Payer: Self-pay | Admitting: Family Medicine

## 2024-08-02 DIAGNOSIS — G43809 Other migraine, not intractable, without status migrainosus: Secondary | ICD-10-CM

## 2024-08-02 MED ORDER — ALLOPURINOL 100 MG PO TABS: 100.0000 mg | ORAL_TABLET | Freq: Every day | ORAL | 4 refills | Status: AC

## 2024-08-02 MED ORDER — ALPRAZOLAM 0.5 MG PO TABS: 0.2500 mg | ORAL_TABLET | Freq: Two times a day (BID) | ORAL | 2 refills | Status: AC | PRN

## 2024-08-02 NOTE — Telephone Encounter (Signed)
 Copied from CRM [id number]. Topic: Clinical - Medication Refill >> Aug 02, 2024 11:03 AM Tiffini S wrote: Medication: valsartan -hydrochlorothiazide  (DIOVAN -HCT) 160-25 MG tablet,  ketorolac  (TORADOL ) 10 MG tablet, rizatriptan  (MAXALT -MLT) 10 MG disintegrating tablet Has the patient contacted their pharmacy? No (Agent: If no, request that the patient contact the pharmacy for the refill. If patient does not wish to contact the pharmacy document the reason why and proceed with request.) (Agent: If yes, when and what did the pharmacy advise?)  This is the patient's preferred pharmacy:    [facility] - [place], [place] - [address] [place] [address] Phone: [phone] Fax: [phone]  Is this the correct pharmacy for this prescription? Yes If no, delete pharmacy and type the correct one.   Has the prescription been filled recently? No  Is the patient out of the medication? Unsure   Has the patient been seen for an appointment in the last year OR does the patient have an upcoming appointment? Yes  Can we respond through MyChart? No, please call [phone]  Agent: Please be advised that Rx refills may take up to 3 business days. We ask that you follow-up with your pharmacy.

## 2024-08-04 NOTE — Telephone Encounter (Signed)
 Rx 10/28/23 #90 3RF- 1 year supply- too soon Requested Prescriptions  Pending Prescriptions Disp Refills   rizatriptan  (MAXALT -MLT) 10 MG disintegrating tablet 10 tablet 1    Sig: Take 1 tablet (10 mg total) by mouth as needed for migraine. May repeat in 2 hours if needed     Neurology:  Migraine Therapy - Triptan Failed - 08/04/2024  1:46 PM      Failed - Last BP in normal range    BP Readings from Last 1 Encounters:  07/17/24 (!) 148/74         Passed - Valid encounter within last 12 months    Recent Outpatient Visits           2 weeks ago Type 2 diabetes mellitus without complication, without long-term current use of insulin (HCC)   [place] [facility] Gasper Nancyann BRAVO, MD   3 weeks ago Viral upper respiratory illness   [place] [facility] Simmons-Robinson, [place], MD   2 months ago Right ankle swelling   [place] [facility] Gasper Nancyann BRAVO, MD   9 months ago Type 2 diabetes mellitus without complication, without long-term current use of insulin (HCC)   [place] [facility] Gasper Nancyann BRAVO, MD       Future Appointments             In 8 months MacDiarmid, [place], MD [facility] [place]             ketorolac  (TORADOL ) 10 MG tablet 20 tablet 1    Sig: Take 1 tablet (10 mg total) by mouth every 6 (six) hours as needed.     Analgesics: NSAIDS 2 Failed - 08/04/2024  1:46 PM      Failed - HGB in normal range and within 360 days    Hemoglobin  Date Value Ref Range Status  10/27/2022 11.7 11.1 - 15.9 g/dL Final         Passed - Cr in normal range and within 360 days    Creatinine  Date Value Ref Range Status  06/28/2014 1.22 0.60 - 1.30 mg/dL Final   Creatinine, Ser  Date Value Ref Range Status  10/26/2023 0.83 0.57 - 1.00 mg/dL Final   Creatinine, POC  Date Value Ref Range Status  11/10/2016 NA mg/dL Final         Passed - Patient is not pregnant      Passed - Valid  encounter within last 12 months    Recent Outpatient Visits           2 weeks ago Type 2 diabetes mellitus without complication, without long-term current use of insulin (HCC)   [facility] Gasper Nancyann BRAVO, MD   3 weeks ago Viral upper respiratory illness   [place] [facility] Simmons-Robinson, [place], MD   2 months ago Right ankle swelling   [facility] Gasper Nancyann BRAVO, MD   9 months ago Type 2 diabetes mellitus without complication, without long-term current use of insulin (HCC)    [facility] Gasper Nancyann BRAVO, MD       Future Appointments             In 8 months MacDiarmid, Glendia, MD [facility] [place]            Refused Prescriptions Disp Refills   valsartan -hydrochlorothiazide  (DIOVAN -HCT) 160-25 MG tablet 90 tablet 3    Sig: Take 1 tablet  by mouth daily. TAKE IN PLACE OF SEPARATE PRESCRIPTIONS OF VALSARTAN  160 AND hydrochlorothiazide  25     Cardiovascular: ARB + Diuretic Combos Failed - 08/04/2024  1:46 PM      Failed - K in normal range and within 180 days    Potassium  Date Value Ref Range Status  10/26/2023 3.9 3.5 - 5.2 mmol/L Final  06/28/2014 3.4 (L) 3.5 - 5.1 mmol/L Final         Failed - Na in normal range and within 180 days    Sodium  Date Value Ref Range Status  10/26/2023 142 134 - 144 mmol/L Final  06/28/2014 140 136 - 145 mmol/L Final         Failed - Cr in normal range and within 180 days    Creatinine  Date Value Ref Range Status  06/28/2014 1.22 0.60 - 1.30 mg/dL Final   Creatinine, Ser  Date Value Ref Range Status  10/26/2023 0.83 0.57 - 1.00 mg/dL Final   Creatinine, POC  Date Value Ref Range Status  11/10/2016 NA mg/dL Final         Failed - eGFR is 10 or above and within 180 days    EGFR (African American)  Date Value Ref Range Status  06/28/2014 59 (L) >70mL/min Final  01/20/2012 60 (L)  Final   GFR calc Af Amer   Date Value Ref Range Status  10/18/2019 72 >59 mL/min/1.73 Final   EGFR (Non-African Amer.)  Date Value Ref Range Status  06/28/2014 49 (L) >70mL/min Final    Comment:    eGFR values <6mL/min/1.73 m2 may be an indication of chronic kidney disease (CKD). Calculated eGFR, using the MRDR Study equation, is useful in  patients with stable renal function. The eGFR calculation will not be reliable in acutely ill patients when serum creatinine is changing rapidly. It is not useful in patients on dialysis. The eGFR calculation may not be applicable to patients at the low and high extremes of body sizes, pregnant women, and vegetarians.   01/20/2012 52 (L)  Final    Comment:    eGFR values <9mL/min/1.73 m2 may be an indication of chronic kidney disease (CKD). Calculated eGFR is useful in patients with stable renal function. The eGFR calculation will not be reliable in acutely ill patients when serum creatinine is changing rapidly. It is not useful in  patients on dialysis. The eGFR calculation may not be applicable to patients at the low and high extremes of body sizes, pregnant women, and vegetarians.    GFR calc non Af Amer  Date Value Ref Range Status  10/18/2019 62 >59 mL/min/1.73 Final   eGFR  Date Value Ref Range Status  10/26/2023 80 >59 mL/min/1.73 Final         Failed - Last BP in normal range    BP Readings from Last 1 Encounters:  07/17/24 (!) 148/74         Passed - Patient is not pregnant      Passed - Valid encounter within last 6 months    Recent Outpatient Visits           2 weeks ago Type 2 diabetes mellitus without complication, without long-term current use of insulin (HCC)   [place] [facility] Gasper Nancyann BRAVO, MD   3 weeks ago Viral upper respiratory illness   [place] [facility] Simmons-Robinson, [place], MD   2 months ago Right ankle swelling    [facility],  Nancyann BRAVO, MD    9 months ago Type 2 diabetes mellitus without complication, without long-term current use of insulin (HCC)   [place] [facility] Gasper, Nancyann BRAVO, MD       Future Appointments             In 8 months MacDiarmid, Glendia, MD [facility]

## 2024-08-04 NOTE — Telephone Encounter (Signed)
 Requested medication (s) are due for refill today - expired  Requested medication (s) are on the active medication list -yes  Future visit scheduled -yes  Last refill: rizatriptan - 04/07/23 #10 1RF- expired Rx                  Ketoralac-07/17/24 #20 1RF- too soon  Notes to clinic: see above- sent for review   Requested Prescriptions  Pending Prescriptions Disp Refills   rizatriptan  (MAXALT -MLT) 10 MG disintegrating tablet 10 tablet 1    Sig: Take 1 tablet (10 mg total) by mouth as needed for migraine. May repeat in 2 hours if needed     Neurology:  Migraine Therapy - Triptan Failed - 08/04/2024  1:47 PM      Failed - Last BP in normal range    BP Readings from Last 1 Encounters:  07/17/24 (!) 148/74         Passed - Valid encounter within last 12 months    Recent Outpatient Visits           2 weeks ago Type 2 diabetes mellitus without complication, without long-term current use of insulin (HCC)   [place] [facility] Gasper Nancyann BRAVO, MD   3 weeks ago Viral upper respiratory illness   [place] [facility] Simmons-Robinson, [place], MD   2 months ago Right ankle swelling   [facility] Gasper Nancyann BRAVO, MD   9 months ago Type 2 diabetes mellitus without complication, without long-term current use of insulin (HCC)   [place] [facility] Gas[facility] Nancyann BRAVO, MD       Future Appointments             In 8 months MacDiarmid, [place]ia, MD [facility] [place]             ketorolac  (TORADOL ) 10 MG tablet 20 tablet 1    Sig: Take 1 tablet (10 mg total) by mouth every 6 (six) hours as needed.     Analgesics: NSAIDS 2 Failed - 08/04/2024  1:47 PM      Failed - HGB in normal range and within 360 days    Hemoglobin  Date Value Ref Range Status  10/27/2022 11.7 11.1 - 15.9 g/dL Final         Passed - Cr in normal range and within 360 days    Creatinine  Date Value Ref Range Status   06/28/2014 1.22 0.60 - 1.30 mg/dL Final   Creatinine, Ser  Date Value Ref Range Status  10/26/2023 0.83 0.57 - 1.00 mg/dL Final   Creatinine, POC  Date Value Ref Range Status  11/10/2016 NA mg/dL Final         Passed - Patient is not pregnant      Passed - Valid encounter within last 12 months    Recent Outpatient Visits           2 weeks ago Type 2 diabetes mellitus without complication, without long-term current use of insulin (HCC)   [place] [facility] Gasper Nancyann BRAVO, MD   3 weeks ago Viral upper respiratory illness   [place] [facility] Simmons-Robinson, [place], MD   2 months ago Right ankle swelling   [place] [facility] Gasper Nancyann BRAVO, MD   9 months ago Type 2 diabetes mellitus without complication, without long-term current use of insulin (HCC)   [place] [facility] Gasper, Nancyann BRAVO, MD  Future Appointments             In 8 months MacDiarmid, Glendia, MD [facility] [place]            Refused Prescriptions Disp Refills   valsartan -hydrochlorothiazide  (DIOVAN -HCT) 160-25 MG tablet 90 tablet 3    Sig: Take 1 tablet by mouth daily. TAKE IN PLACE OF SEPARATE PRESCRIPTIONS OF VALSARTAN  160 AND hydrochlorothiazide  25     Cardiovascular: ARB + Diuretic Combos Failed - 08/04/2024  1:47 PM      Failed - K in normal range and within 180 days    Potassium  Date Value Ref Range Status  10/26/2023 3.9 3.5 - 5.2 mmol/L Final  06/28/2014 3.4 (L) 3.5 - 5.1 mmol/L Final         Failed - Na in normal range and within 180 days    Sodium  Date Value Ref Range Status  10/26/2023 142 134 - 144 mmol/L Final  06/28/2014 140 136 - 145 mmol/L Final         Failed - Cr in normal range and within 180 days    Creatinine  Date Value Ref Range Status  06/28/2014 1.22 0.60 - 1.30 mg/dL Final   Creatinine, Ser  Date Value Ref Range Status  10/26/2023 0.83 0.57 - 1.00 mg/dL Final    Creatinine, POC  Date Value Ref Range Status  11/10/2016 NA mg/dL Final         Failed - eGFR is 10 or above and within 180 days    EGFR (African American)  Date Value Ref Range Status  06/28/2014 59 (L) >95mL/min Final  01/20/2012 60 (L)  Final   GFR calc Af Amer  Date Value Ref Range Status  10/18/2019 72 >59 mL/min/1.73 Final   EGFR (Non-African Amer.)  Date Value Ref Range Status  06/28/2014 49 (L) >50mL/min Final    Comment:    eGFR values <76mL/min/1.73 m2 may be an indication of chronic kidney disease (CKD). Calculated eGFR, using the MRDR Study equation, is useful in  patients with stable renal function. The eGFR calculation will not be reliable in acutely ill patients when serum creatinine is changing rapidly. It is not useful in patients on dialysis. The eGFR calculation may not be applicable to patients at the low and high extremes of body sizes, pregnant women, and vegetarians.   01/20/2012 52 (L)  Final    Comment:    eGFR values <51mL/min/1.73 m2 may be an indication of chronic kidney disease (CKD). Calculated eGFR is useful in patients with stable renal function. The eGFR calculation will not be reliable in acutely ill patients when serum creatinine is changing rapidly. It is not useful in  patients on dialysis. The eGFR calculation may not be applicable to patients at the low and high extremes of body sizes, pregnant women, and vegetarians.    GFR calc non Af Amer  Date Value Ref Range Status  10/18/2019 62 >59 mL/min/1.73 Final   eGFR  Date Value Ref Range Status  10/26/2023 80 >59 mL/min/1.73 Final         Failed - Last BP in normal range    BP Readings from Last 1 Encounters:  07/17/24 (!) 148/74         Passed - Patient is not pregnant      Passed - Valid encounter within last 6 months    Recent Outpatient Visits           2 weeks ago Type 2 diabetes  mellitus without complication, without long-term current use of insulin (HCC)    [place] [facility] Gasper Nancyann BRAVO, MD   3 weeks ago Viral upper respiratory illness   [place] [facility] Simmons-Robinson, [place], MD   2 months ago Right ankle swelling   [place] [facility] Gasper Nancyann BRAVO, MD   9 months ago Type 2 diabetes mellitus without complication, without long-term current use of insulin (HCC)   [place] [facility] Gasper Nancyann BRAVO, MD       Future Appointments             In 8 months MacDiarmid, Glendia, MD [facility] [place]               Requested Prescriptions  Pending Prescriptions Disp Refills   rizatriptan  (MAXALT -MLT) 10 MG disintegrating tablet 10 tablet 1    Sig: Take 1 tablet (10 mg total) by mouth as needed for migraine. May repeat in 2 hours if needed     Neurology:  Migraine Therapy - Triptan Failed - 08/04/2024  1:47 PM      Failed - Last BP in normal range    BP Readings from Last 1 Encounters:  07/17/24 (!) 148/74         Passed - Valid encounter within last 12 months    Recent Outpatient Visits           2 weeks ago Type 2 diabetes mellitus without complication, without long-term current use of insulin (HCC)   [place] [facility] Gasper Nancyann BRAVO, MD   3 weeks ago Viral upper respiratory illness   [place] [facility] Simmons-Robinson, [place], MD   2 months ago Right ankle swelling   [place] [facility] Gasper Nancyann BRAVO, MD   9 months ago Type 2 diabetes mellitus without complication, without long-term current use of insulin (HCC)   [place] [facility] Gasper Nancyann BRAVO, MD       Future Appointments             In 8 months MacDiarmid, Glendia, MD [facility] [place]             ketorolac  (TORADOL ) 10 MG tablet 20 tablet 1    Sig: Take 1 tablet (10 mg total) by mouth every 6 (six) hours as needed.     Analgesics: NSAIDS 2 Failed -  08/04/2024  1:47 PM      Failed - HGB in normal range and within 360 days    Hemoglobin  Date Value Ref Range Status  10/27/2022 11.7 11.1 - 15.9 g/dL Final         Passed - Cr in normal range and within 360 days    Creatinine  Date Value Ref Range Status  06/28/2014 1.22 0.60 - 1.30 mg/dL Final   Creatinine, Ser  Date Value Ref Range Status  10/26/2023 0.83 0.57 - 1.00 mg/dL Final   Creatinine, POC  Date Value Ref Range Status  11/10/2016 NA mg/dL Final         Passed - Patient is not pregnant      Passed - Valid encounter within last 12 months    Recent Outpatient Visits           2 weeks ago Type 2 diabetes mellitus without complication, without long-term current use of insulin (HCC)   [place] [facility] Gas[facility] Nancyann BRAVO, MD   3 weeks ago  Viral upper respiratory illness   [place] [facility] Simmons-Robinson, [place], MD   2 months ago Right ankle swelling   [place] [facility] Gasper Nancyann BRAVO, MD   9 months ago Type 2 diabetes mellitus without complication, without long-term current use of insulin (HCC)   [place] [facility] Gasper Nancyann BRAVO, MD       Future Appointments             In 8 months MacDiarmid, Glendia, MD [facility] [place]            Refused Prescriptions Disp Refills   valsartan -hydrochlorothiazide  (DIOVAN -HCT) 160-25 MG tablet 90 tablet 3    Sig: Take 1 tablet by mouth daily. TAKE IN PLACE OF SEPARATE PRESCRIPTIONS OF VALSARTAN  160 AND hydrochlorothiazide  25     Cardiovascular: ARB + Diuretic Combos Failed - 08/04/2024  1:47 PM      Failed - K in normal range and within 180 days    Potassium  Date Value Ref Range Status  10/26/2023 3.9 3.5 - 5.2 mmol/L Final  06/28/2014 3.4 (L) 3.5 - 5.1 mmol/L Final         Failed - Na in normal range and within 180 days    Sodium  Date Value Ref Range Status  10/26/2023 142 134 - 144 mmol/L Final  06/28/2014  140 136 - 145 mmol/L Final         Failed - Cr in normal range and within 180 days    Creatinine  Date Value Ref Range Status  06/28/2014 1.22 0.60 - 1.30 mg/dL Final   Creatinine, Ser  Date Value Ref Range Status  10/26/2023 0.83 0.57 - 1.00 mg/dL Final   Creatinine, POC  Date Value Ref Range Status  11/10/2016 NA mg/dL Final         Failed - eGFR is 10 or above and within 180 days    EGFR (African American)  Date Value Ref Range Status  06/28/2014 59 (L) >42mL/min Final  01/20/2012 60 (L)  Final   GFR calc Af Amer  Date Value Ref Range Status  10/18/2019 72 >59 mL/min/1.73 Final   EGFR (Non-African Amer.)  Date Value Ref Range Status  06/28/2014 49 (L) >57mL/min Final    Comment:    eGFR values <82mL/min/1.73 m2 may be an indication of chronic kidney disease (CKD). Calculated eGFR, using the MRDR Study equation, is useful in  patients with stable renal function. The eGFR calculation will not be reliable in acutely ill patients when serum creatinine is changing rapidly. It is not useful in patients on dialysis. The eGFR calculation may not be applicable to patients at the low and high extremes of body sizes, pregnant women, and vegetarians.   01/20/2012 52 (L)  Final    Comment:    eGFR values <45mL/min/1.73 m2 may be an indication of chronic kidney disease (CKD). Calculated eGFR is useful in patients with stable renal function. The eGFR calculation will not be reliable in acutely ill patients when serum creatinine is changing rapidly. It is not useful in  patients on dialysis. The eGFR calculation may not be applicable to patients at the low and high extremes of body sizes, pregnant women, and vegetarians.    GFR calc non Af Amer  Date Value Ref Range Status  10/18/2019 62 >59 mL/min/1.73 Final   eGFR  Date Value Ref Range Status  10/26/2023 80 >59 mL/min/1.73 Final         Failed -  Last BP in normal range    BP Readings from Last 1 Encounters:   07/17/24 (!) 148/74         Passed - Patient is not pregnant      Passed - Valid encounter within last 6 months    Recent Outpatient Visits           2 weeks ago Type 2 diabetes mellitus without complication, without long-term current use of insulin (HCC)   [place] [facility] Gasper Nancyann BRAVO, MD   3 weeks ago Viral upper respiratory illness   [place] [facility] Simmons-Robinson, [place], MD   2 months ago Right ankle swelling   [place] [facility] Gasper Nancyann BRAVO, MD   9 months ago Type 2 diabetes mellitus without complication, without long-term current use of insulin (HCC)   [facility] Gasper, Nancyann BRAVO, MD       Future Appointments             In 8 months MacDiarmid, Glendia, MD [facility]

## 2024-08-07 ENCOUNTER — Telehealth: Payer: Self-pay

## 2024-08-07 MED ORDER — KETOROLAC TROMETHAMINE 10 MG PO TABS: 10.0000 mg | ORAL_TABLET | Freq: Four times a day (QID) | ORAL | 1 refills | Status: AC | PRN

## 2024-08-07 MED ORDER — RIZATRIPTAN BENZOATE 10 MG PO TBDP: 10.0000 mg | ORAL_TABLET | ORAL | 1 refills | Status: AC | PRN

## 2024-08-07 NOTE — Telephone Encounter (Signed)
 Copied from CRM [id number]. Topic: Clinical - Medication Question >> Aug 07, 2024  4:52 PM Shereese L wrote: Reason for CRM: [facility] to verify if the patient has had any injection for the ketorolac  (TORADOL ) 10 MG tablet. Requesting a call back CB# [phone]

## 2024-08-08 NOTE — Telephone Encounter (Signed)
 Attempted to call pharmacy to clarify, was on lunch. Will retry after 2pm when they return

## 2024-08-08 NOTE — Telephone Encounter (Signed)
I don't understand the question

## 2024-08-08 NOTE — Telephone Encounter (Signed)
 Copied from CRM [id number]. Topic: Clinical - Prescription Issue >> Aug 08, 2024  4:00 PM Delon T wrote: Reason for CRM: went to pick up prescriptions and they said she needs an injection- she states she has never received an injection before- [phone]

## 2024-08-09 ENCOUNTER — Telehealth: Payer: Self-pay | Admitting: Family Medicine

## 2024-08-09 NOTE — Telephone Encounter (Signed)
"  error  "

## 2024-08-09 NOTE — Telephone Encounter (Signed)
Pharmacy advised. Verbalized understanding

## 2024-08-09 NOTE — Telephone Encounter (Signed)
 Yes, she has had ketorolac  shots and oral ketorolac  before.

## 2024-08-09 NOTE — Telephone Encounter (Signed)
 I just spoke to [facility]ist at [facility] on [address]. Regarding the issue with filling Toradol  for Miranda Moore.   Toradol  has a black box warning and the only way pharmacies can fill this RX is to have documentation that she has received Toradol  by IM or IV in the past week.   Otherwise, the pharmacy cannot fill this Rx.      Please call patient and let her know if there is another Rx Dr. Gasper can give her.

## 2024-08-20 MED ORDER — OMEPRAZOLE 40 MG PO CPDR: DELAYED_RELEASE_CAPSULE | ORAL | 4 refills | Status: AC

## 2024-09-03 MED ORDER — VALSARTAN-HYDROCHLOROTHIAZIDE 160-25 MG PO TABS: 1.0000 | ORAL_TABLET | Freq: Every day | ORAL | 3 refills | Status: AC

## 2024-12-15 ENCOUNTER — Ambulatory Visit: Admitting: Family Medicine

## 2025-04-02 ENCOUNTER — Ambulatory Visit: Admitting: Urology
